# Patient Record
Sex: Female | Born: 1975 | Race: White | Hispanic: No | Marital: Married | Smoking: Never smoker
Health system: Southern US, Community
[De-identification: ages and names within clinical notes are randomized; demographics above are authoritative.]

## PROBLEM LIST (undated history)

## (undated) DIAGNOSIS — J45909 Unspecified asthma, uncomplicated: Secondary | ICD-10-CM

## (undated) DIAGNOSIS — B019 Varicella without complication: Secondary | ICD-10-CM

## (undated) DIAGNOSIS — S83249A Other tear of medial meniscus, current injury, unspecified knee, initial encounter: Secondary | ICD-10-CM

## (undated) DIAGNOSIS — K219 Gastro-esophageal reflux disease without esophagitis: Secondary | ICD-10-CM

## (undated) DIAGNOSIS — E162 Hypoglycemia, unspecified: Secondary | ICD-10-CM

## (undated) DIAGNOSIS — T7840XA Allergy, unspecified, initial encounter: Secondary | ICD-10-CM

## (undated) DIAGNOSIS — IMO0001 Reserved for inherently not codable concepts without codable children: Secondary | ICD-10-CM

## (undated) DIAGNOSIS — Z62810 Personal history of physical and sexual abuse in childhood: Secondary | ICD-10-CM

## (undated) DIAGNOSIS — L989 Disorder of the skin and subcutaneous tissue, unspecified: Secondary | ICD-10-CM

## (undated) DIAGNOSIS — M545 Low back pain: Secondary | ICD-10-CM

## (undated) DIAGNOSIS — J3489 Other specified disorders of nose and nasal sinuses: Secondary | ICD-10-CM

## (undated) DIAGNOSIS — G43909 Migraine, unspecified, not intractable, without status migrainosus: Secondary | ICD-10-CM

## (undated) DIAGNOSIS — J309 Allergic rhinitis, unspecified: Secondary | ICD-10-CM

## (undated) DIAGNOSIS — E559 Vitamin D deficiency, unspecified: Secondary | ICD-10-CM

## (undated) DIAGNOSIS — F419 Anxiety disorder, unspecified: Secondary | ICD-10-CM

## (undated) DIAGNOSIS — R5383 Other fatigue: Secondary | ICD-10-CM

## (undated) DIAGNOSIS — I7300 Raynaud's syndrome without gangrene: Secondary | ICD-10-CM

## (undated) HISTORY — DX: Migraine, unspecified, not intractable, without status migrainosus: G43.909

## (undated) HISTORY — PX: HEMORRHOID SURGERY: SHX153

## (undated) HISTORY — DX: Unspecified asthma, uncomplicated: J45.909

## (undated) HISTORY — DX: Reserved for inherently not codable concepts without codable children: IMO0001

## (undated) HISTORY — DX: Other specified disorders of nose and nasal sinuses: J34.89

## (undated) HISTORY — DX: Raynaud's syndrome without gangrene: I73.00

## (undated) HISTORY — DX: Other tear of medial meniscus, current injury, unspecified knee, initial encounter: S83.249A

## (undated) HISTORY — DX: Other fatigue: R53.83

## (undated) HISTORY — PX: TONSILLECTOMY: SHX5217

## (undated) HISTORY — DX: Personal history of physical and sexual abuse in childhood: Z62.810

## (undated) HISTORY — DX: Allergic rhinitis, unspecified: J30.9

## (undated) HISTORY — DX: Hypoglycemia, unspecified: E16.2

## (undated) HISTORY — DX: Disorder of the skin and subcutaneous tissue, unspecified: L98.9

## (undated) HISTORY — PX: TONSILLECTOMY: SUR1361

## (undated) HISTORY — DX: Anxiety disorder, unspecified: F41.9

## (undated) HISTORY — DX: Allergy, unspecified, initial encounter: T78.40XA

## (undated) HISTORY — DX: Low back pain: M54.5

## (undated) HISTORY — PX: EYE SURGERY: SHX253

## (undated) HISTORY — DX: Gastro-esophageal reflux disease without esophagitis: K21.9

## (undated) HISTORY — DX: Vitamin D deficiency, unspecified: E55.9

## (undated) HISTORY — DX: Varicella without complication: B01.9

## (undated) HISTORY — PX: GASTRIC FUNDOPLICATION: SHX226

## (undated) HISTORY — PX: MANDIBLE SURGERY: SHX707

---

## 1998-09-07 HISTORY — PX: WISDOM TOOTH EXTRACTION: SHX21

## 2003-09-08 DIAGNOSIS — I7300 Raynaud's syndrome without gangrene: Secondary | ICD-10-CM

## 2003-09-08 HISTORY — DX: Raynaud's syndrome without gangrene: I73.00

## 2009-09-07 LAB — HM PAP SMEAR: HM Pap smear: NORMAL

## 2010-05-18 ENCOUNTER — Ambulatory Visit: Payer: Self-pay | Admitting: Family Medicine

## 2010-06-22 ENCOUNTER — Ambulatory Visit: Payer: Self-pay | Admitting: Internal Medicine

## 2011-07-08 ENCOUNTER — Ambulatory Visit: Payer: Self-pay | Admitting: Internal Medicine

## 2012-07-08 ENCOUNTER — Encounter: Payer: Self-pay | Admitting: Family Medicine

## 2012-07-08 ENCOUNTER — Ambulatory Visit (INDEPENDENT_AMBULATORY_CARE_PROVIDER_SITE_OTHER): Payer: BC Managed Care – PPO | Admitting: Family Medicine

## 2012-07-08 VITALS — BP 128/86 | HR 84 | Temp 98.6°F | Ht 63.5 in | Wt 141.4 lb

## 2012-07-08 DIAGNOSIS — IMO0001 Reserved for inherently not codable concepts without codable children: Secondary | ICD-10-CM

## 2012-07-08 DIAGNOSIS — R5381 Other malaise: Secondary | ICD-10-CM

## 2012-07-08 DIAGNOSIS — K229 Disease of esophagus, unspecified: Secondary | ICD-10-CM | POA: Insufficient documentation

## 2012-07-08 DIAGNOSIS — M545 Low back pain, unspecified: Secondary | ICD-10-CM

## 2012-07-08 DIAGNOSIS — J309 Allergic rhinitis, unspecified: Secondary | ICD-10-CM

## 2012-07-08 DIAGNOSIS — R5383 Other fatigue: Secondary | ICD-10-CM | POA: Insufficient documentation

## 2012-07-08 DIAGNOSIS — I7300 Raynaud's syndrome without gangrene: Secondary | ICD-10-CM | POA: Insufficient documentation

## 2012-07-08 DIAGNOSIS — K219 Gastro-esophageal reflux disease without esophagitis: Secondary | ICD-10-CM | POA: Insufficient documentation

## 2012-07-08 DIAGNOSIS — Z0000 Encounter for general adult medical examination without abnormal findings: Secondary | ICD-10-CM

## 2012-07-08 DIAGNOSIS — J45909 Unspecified asthma, uncomplicated: Secondary | ICD-10-CM

## 2012-07-08 DIAGNOSIS — E559 Vitamin D deficiency, unspecified: Secondary | ICD-10-CM

## 2012-07-08 DIAGNOSIS — G43909 Migraine, unspecified, not intractable, without status migrainosus: Secondary | ICD-10-CM

## 2012-07-08 DIAGNOSIS — Z0001 Encounter for general adult medical examination with abnormal findings: Secondary | ICD-10-CM | POA: Insufficient documentation

## 2012-07-08 DIAGNOSIS — E162 Hypoglycemia, unspecified: Secondary | ICD-10-CM

## 2012-07-08 DIAGNOSIS — F411 Generalized anxiety disorder: Secondary | ICD-10-CM

## 2012-07-08 DIAGNOSIS — F419 Anxiety disorder, unspecified: Secondary | ICD-10-CM

## 2012-07-08 HISTORY — DX: Low back pain, unspecified: M54.50

## 2012-07-08 HISTORY — DX: Anxiety disorder, unspecified: F41.9

## 2012-07-08 HISTORY — DX: Other fatigue: R53.83

## 2012-07-08 HISTORY — DX: Hypoglycemia, unspecified: E16.2

## 2012-07-08 LAB — HEPATIC FUNCTION PANEL
ALT: 22 U/L (ref 0–35)
AST: 20 U/L (ref 0–37)
Alkaline Phosphatase: 46 U/L (ref 39–117)
Bilirubin, Direct: 0.1 mg/dL (ref 0.0–0.3)
Total Bilirubin: 0.7 mg/dL (ref 0.3–1.2)
Total Protein: 6.7 g/dL (ref 6.0–8.3)

## 2012-07-08 LAB — CBC
HCT: 38.4 % (ref 36.0–46.0)
RBC: 4.37 Mil/uL (ref 3.87–5.11)
RDW: 15 % — ABNORMAL HIGH (ref 11.5–14.6)
WBC: 5.5 10*3/uL (ref 4.5–10.5)

## 2012-07-08 LAB — RENAL FUNCTION PANEL
CO2: 30 mEq/L (ref 19–32)
Calcium: 9.4 mg/dL (ref 8.4–10.5)
Chloride: 103 mEq/L (ref 96–112)
Creatinine, Ser: 0.9 mg/dL (ref 0.4–1.2)
GFR: 77.53 mL/min (ref >60.00)
Potassium: 4.2 mEq/L (ref 3.5–5.1)
Sodium: 138 mEq/L (ref 135–145)

## 2012-07-08 LAB — LIPID PANEL
Total CHOL/HDL Ratio: 3
VLDL: 16.4 mg/dL (ref 0.0–40.0)

## 2012-07-08 LAB — CORTISOL: Cortisol, Plasma: 6.9 ug/dL

## 2012-07-08 MED ORDER — FEXOFENADINE HCL 180 MG PO TABS: 180.0000 mg | ORAL_TABLET | Freq: Every day | ORAL | Status: DC

## 2012-07-08 NOTE — Assessment & Plan Note (Signed)
Feels better when she minimize carbs. Is not on any medications at this time. He is diet controlled at this time

## 2012-07-08 NOTE — Assessment & Plan Note (Signed)
Patient reports is ongoing and long-standing. She questions the idea of adrenal fatigue. We will check a cortisol and TSH level but she is also asked to consider the idea of regular exercise and possibly movement therapy such as yoga in the future

## 2012-07-08 NOTE — Patient Instructions (Addendum)
Preventive Care for Adults, Female A healthy lifestyle and preventive care can promote health and wellness. Preventive health guidelines for women include the following key practices.  A routine yearly physical is a good way to check with your caregiver about your health and preventive screening. It is a chance to share any concerns and updates on your health, and to receive a thorough exam.  Visit your dentist for a routine exam and preventive care every 6 months. Brush your teeth twice a day and floss once a day. Good oral hygiene prevents tooth decay and gum disease.  The frequency of eye exams is based on your age, health, family medical history, use of contact lenses, and other factors. Follow your caregiver's recommendations for frequency of eye exams.  Eat a healthy diet. Foods like vegetables, fruits, whole grains, low-fat dairy products, and lean protein foods contain the nutrients you need without too many calories. Decrease your intake of foods high in solid fats, added sugars, and salt. Eat the right amount of calories for you.Get information about a proper diet from your caregiver, if necessary.  Regular physical exercise is one of the most important things you can do for your health. Most adults should get at least 150 minutes of moderate-intensity exercise (any activity that increases your heart rate and causes you to sweat) each week. In addition, most adults need muscle-strengthening exercises on 2 or more days a week.  Maintain a healthy weight. The body mass index (BMI) is a screening tool to identify possible weight problems. It provides an estimate of body fat based on height and weight. Your caregiver can help determine your BMI, and can help you achieve or maintain a healthy weight.For adults 20 years and older:  A BMI below 18.5 is considered underweight.  A BMI of 18.5 to 24.9 is normal.  A BMI of 25 to 29.9 is considered overweight.  A BMI of 30 and above is  considered obese.  Maintain normal blood lipids and cholesterol levels by exercising and minimizing your intake of saturated fat. Eat a balanced diet with plenty of fruit and vegetables. Blood tests for lipids and cholesterol should begin at age 20 and be repeated every 5 years. If your lipid or cholesterol levels are high, you are over 50, or you are at high risk for heart disease, you may need your cholesterol levels checked more frequently.Ongoing high lipid and cholesterol levels should be treated with medicines if diet and exercise are not effective.  If you smoke, find out from your caregiver how to quit. If you do not use tobacco, do not start.  If you are pregnant, do not drink alcohol. If you are breastfeeding, be very cautious about drinking alcohol. If you are not pregnant and choose to drink alcohol, do not exceed 1 drink per day. One drink is considered to be 12 ounces (355 mL) of beer, 5 ounces (148 mL) of wine, or 1.5 ounces (44 mL) of liquor.  Avoid use of street drugs. Do not share needles with anyone. Ask for help if you need support or instructions about stopping the use of drugs.  High blood pressure causes heart disease and increases the risk of stroke. Your blood pressure should be checked at least every 1 to 2 years. Ongoing high blood pressure should be treated with medicines if weight loss and exercise are not effective.  If you are 55 to 37 years old, ask your caregiver if you should take aspirin to prevent strokes.  Diabetes   screening involves taking a blood sample to check your fasting blood sugar level. This should be done once every 3 years, after age 45, if you are within normal weight and without risk factors for diabetes. Testing should be considered at a younger age or be carried out more frequently if you are overweight and have at least 1 risk factor for diabetes.  Breast cancer screening is essential preventive care for women. You should practice "breast  self-awareness." This means understanding the normal appearance and feel of your breasts and may include breast self-examination. Any changes detected, no matter how small, should be reported to a caregiver. Women in their 20s and 30s should have a clinical breast exam (CBE) by a caregiver as part of a regular health exam every 1 to 3 years. After age 40, women should have a CBE every year. Starting at age 40, women should consider having a mammography (breast X-ray test) every year. Women who have a family history of breast cancer should talk to their caregiver about genetic screening. Women at a high risk of breast cancer should talk to their caregivers about having magnetic resonance imaging (MRI) and a mammography every year.  The Pap test is a screening test for cervical cancer. A Pap test can show cell changes on the cervix that might become cervical cancer if left untreated. A Pap test is a procedure in which cells are obtained and examined from the lower end of the uterus (cervix).  Women should have a Pap test starting at age 21.  Between ages 21 and 29, Pap tests should be repeated every 2 years.  Beginning at age 30, you should have a Pap test every 3 years as long as the past 3 Pap tests have been normal.  Some women have medical problems that increase the chance of getting cervical cancer. Talk to your caregiver about these problems. It is especially important to talk to your caregiver if a new problem develops soon after your last Pap test. In these cases, your caregiver may recommend more frequent screening and Pap tests.  The above recommendations are the same for women who have or have not gotten the vaccine for human papillomavirus (HPV).  If you had a hysterectomy for a problem that was not cancer or a condition that could lead to cancer, then you no longer need Pap tests. Even if you no longer need a Pap test, a regular exam is a good idea to make sure no other problems are  starting.  If you are between ages 65 and 70, and you have had normal Pap tests going back 10 years, you no longer need Pap tests. Even if you no longer need a Pap test, a regular exam is a good idea to make sure no other problems are starting.  If you have had past treatment for cervical cancer or a condition that could lead to cancer, you need Pap tests and screening for cancer for at least 20 years after your treatment.  If Pap tests have been discontinued, risk factors (such as a new sexual partner) need to be reassessed to determine if screening should be resumed.  The HPV test is an additional test that may be used for cervical cancer screening. The HPV test looks for the virus that can cause the cell changes on the cervix. The cells collected during the Pap test can be tested for HPV. The HPV test could be used to screen women aged 30 years and older, and should   be used in women of any age who have unclear Pap test results. After the age of 30, women should have HPV testing at the same frequency as a Pap test.  Colorectal cancer can be detected and often prevented. Most routine colorectal cancer screening begins at the age of 50 and continues through age 75. However, your caregiver may recommend screening at an earlier age if you have risk factors for colon cancer. On a yearly basis, your caregiver may provide home test kits to check for hidden blood in the stool. Use of a small camera at the end of a tube, to directly examine the colon (sigmoidoscopy or colonoscopy), can detect the earliest forms of colorectal cancer. Talk to your caregiver about this at age 50, when routine screening begins. Direct examination of the colon should be repeated every 5 to 10 years through age 75, unless early forms of pre-cancerous polyps or small growths are found.  Hepatitis C blood testing is recommended for all people born from 1945 through 1965 and any individual with known risks for hepatitis C.  Practice  safe sex. Use condoms and avoid high-risk sexual practices to reduce the spread of sexually transmitted infections (STIs). STIs include gonorrhea, chlamydia, syphilis, trichomonas, herpes, HPV, and human immunodeficiency virus (HIV). Herpes, HIV, and HPV are viral illnesses that have no cure. They can result in disability, cancer, and death. Sexually active women aged 25 and younger should be checked for chlamydia. Older women with new or multiple partners should also be tested for chlamydia. Testing for other STIs is recommended if you are sexually active and at increased risk.  Osteoporosis is a disease in which the bones lose minerals and strength with aging. This can result in serious bone fractures. The risk of osteoporosis can be identified using a bone density scan. Women ages 65 and over and women at risk for fractures or osteoporosis should discuss screening with their caregivers. Ask your caregiver whether you should take a calcium supplement or vitamin D to reduce the rate of osteoporosis.  Menopause can be associated with physical symptoms and risks. Hormone replacement therapy is available to decrease symptoms and risks. You should talk to your caregiver about whether hormone replacement therapy is right for you.  Use sunscreen with sun protection factor (SPF) of 30 or more. Apply sunscreen liberally and repeatedly throughout the day. You should seek shade when your shadow is shorter than you. Protect yourself by wearing long sleeves, pants, a wide-brimmed hat, and sunglasses year round, whenever you are outdoors.  Once a month, do a whole body skin exam, using a mirror to look at the skin on your back. Notify your caregiver of new moles, moles that have irregular borders, moles that are larger than a pencil eraser, or moles that have changed in shape or color.  Stay current with required immunizations.  Influenza. You need a dose every fall (or winter). The composition of the flu vaccine  changes each year, so being vaccinated once is not enough.  Pneumococcal polysaccharide. You need 1 to 2 doses if you smoke cigarettes or if you have certain chronic medical conditions. You need 1 dose at age 65 (or older) if you have never been vaccinated.  Tetanus, diphtheria, pertussis (Tdap, Td). Get 1 dose of Tdap vaccine if you are younger than age 65, are over 65 and have contact with an infant, are a healthcare worker, are pregnant, or simply want to be protected from whooping cough. After that, you need a Td   booster dose every 10 years. Consult your caregiver if you have not had at least 3 tetanus and diphtheria-containing shots sometime in your life or have a deep or dirty wound.  HPV. You need this vaccine if you are a woman age 26 or younger. The vaccine is given in 3 doses over 6 months.  Measles, mumps, rubella (MMR). You need at least 1 dose of MMR if you were born in 1957 or later. You may also need a second dose.  Meningococcal. If you are age 19 to 21 and a first-year college student living in a residence hall, or have one of several medical conditions, you need to get vaccinated against meningococcal disease. You may also need additional booster doses.  Zoster (shingles). If you are age 60 or older, you should get this vaccine.  Varicella (chickenpox). If you have never had chickenpox or you were vaccinated but received only 1 dose, talk to your caregiver to find out if you need this vaccine.  Hepatitis A. You need this vaccine if you have a specific risk factor for hepatitis A virus infection or you simply wish to be protected from this disease. The vaccine is usually given as 2 doses, 6 to 18 months apart.  Hepatitis B. You need this vaccine if you have a specific risk factor for hepatitis B virus infection or you simply wish to be protected from this disease. The vaccine is given in 3 doses, usually over 6 months. Preventive Services / Frequency Ages 19 to 39  Blood  pressure check.** / Every 1 to 2 years.  Lipid and cholesterol check.** / Every 5 years beginning at age 20.  Clinical breast exam.** / Every 3 years for women in their 20s and 30s.  Pap test.** / Every 2 years from ages 21 through 29. Every 3 years starting at age 30 through age 65 or 70 with a history of 3 consecutive normal Pap tests.  HPV screening.** / Every 3 years from ages 30 through ages 65 to 70 with a history of 3 consecutive normal Pap tests.  Hepatitis C blood test.** / For any individual with known risks for hepatitis C.  Skin self-exam. / Monthly.  Influenza immunization.** / Every year.  Pneumococcal polysaccharide immunization.** / 1 to 2 doses if you smoke cigarettes or if you have certain chronic medical conditions.  Tetanus, diphtheria, pertussis (Tdap, Td) immunization. / A one-time dose of Tdap vaccine. After that, you need a Td booster dose every 10 years.  HPV immunization. / 3 doses over 6 months, if you are 26 and younger.  Measles, mumps, rubella (MMR) immunization. / You need at least 1 dose of MMR if you were born in 1957 or later. You may also need a second dose.  Meningococcal immunization. / 1 dose if you are age 19 to 21 and a first-year college student living in a residence hall, or have one of several medical conditions, you need to get vaccinated against meningococcal disease. You may also need additional booster doses.  Varicella immunization.** / Consult your caregiver.  Hepatitis A immunization.** / Consult your caregiver. 2 doses, 6 to 18 months apart.  Hepatitis B immunization.** / Consult your caregiver. 3 doses usually over 6 months. Ages 40 to 64  Blood pressure check.** / Every 1 to 2 years.  Lipid and cholesterol check.** / Every 5 years beginning at age 20.  Clinical breast exam.** / Every year after age 40.  Mammogram.** / Every year beginning at age 40   and continuing for as long as you are in good health. Consult with your  caregiver.  Pap test.** / Every 3 years starting at age 30 through age 65 or 70 with a history of 3 consecutive normal Pap tests.  HPV screening.** / Every 3 years from ages 30 through ages 65 to 70 with a history of 3 consecutive normal Pap tests.  Fecal occult blood test (FOBT) of stool. / Every year beginning at age 50 and continuing until age 75. You may not need to do this test if you get a colonoscopy every 10 years.  Flexible sigmoidoscopy or colonoscopy.** / Every 5 years for a flexible sigmoidoscopy or every 10 years for a colonoscopy beginning at age 50 and continuing until age 75.  Hepatitis C blood test.** / For all people born from 1945 through 1965 and any individual with known risks for hepatitis C.  Skin self-exam. / Monthly.  Influenza immunization.** / Every year.  Pneumococcal polysaccharide immunization.** / 1 to 2 doses if you smoke cigarettes or if you have certain chronic medical conditions.  Tetanus, diphtheria, pertussis (Tdap, Td) immunization.** / A one-time dose of Tdap vaccine. After that, you need a Td booster dose every 10 years.  Measles, mumps, rubella (MMR) immunization. / You need at least 1 dose of MMR if you were born in 1957 or later. You may also need a second dose.  Varicella immunization.** / Consult your caregiver.  Meningococcal immunization.** / Consult your caregiver.  Hepatitis A immunization.** / Consult your caregiver. 2 doses, 6 to 18 months apart.  Hepatitis B immunization.** / Consult your caregiver. 3 doses, usually over 6 months. Ages 65 and over  Blood pressure check.** / Every 1 to 2 years.  Lipid and cholesterol check.** / Every 5 years beginning at age 20.  Clinical breast exam.** / Every year after age 40.  Mammogram.** / Every year beginning at age 40 and continuing for as long as you are in good health. Consult with your caregiver.  Pap test.** / Every 3 years starting at age 30 through age 65 or 70 with a 3  consecutive normal Pap tests. Testing can be stopped between 65 and 70 with 3 consecutive normal Pap tests and no abnormal Pap or HPV tests in the past 10 years.  HPV screening.** / Every 3 years from ages 30 through ages 65 or 70 with a history of 3 consecutive normal Pap tests. Testing can be stopped between 65 and 70 with 3 consecutive normal Pap tests and no abnormal Pap or HPV tests in the past 10 years.  Fecal occult blood test (FOBT) of stool. / Every year beginning at age 50 and continuing until age 75. You may not need to do this test if you get a colonoscopy every 10 years.  Flexible sigmoidoscopy or colonoscopy.** / Every 5 years for a flexible sigmoidoscopy or every 10 years for a colonoscopy beginning at age 50 and continuing until age 75.  Hepatitis C blood test.** / For all people born from 1945 through 1965 and any individual with known risks for hepatitis C.  Osteoporosis screening.** / A one-time screening for women ages 65 and over and women at risk for fractures or osteoporosis.  Skin self-exam. / Monthly.  Influenza immunization.** / Every year.  Pneumococcal polysaccharide immunization.** / 1 dose at age 65 (or older) if you have never been vaccinated.  Tetanus, diphtheria, pertussis (Tdap, Td) immunization. / A one-time dose of Tdap vaccine if you are over   65 and have contact with an infant, are a healthcare worker, or simply want to be protected from whooping cough. After that, you need a Td booster dose every 10 years.  Varicella immunization.** / Consult your caregiver.  Meningococcal immunization.** / Consult your caregiver.  Hepatitis A immunization.** / Consult your caregiver. 2 doses, 6 to 18 months apart.  Hepatitis B immunization.** / Check with your caregiver. 3 doses, usually over 6 months. ** Family history and personal history of risk and conditions may change your caregiver's recommendations. Document Released: 10/20/2001 Document Revised: 11/16/2011  Document Reviewed: 01/19/2011 [employer] Patient Information 2013 [employer].  

## 2012-07-08 NOTE — Assessment & Plan Note (Addendum)
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 Is presently on Advair 100/50 and she remained stable they're hoping to switch her to a strictly inhaled steroid preparation in the not-too-distant future such vulva.

## 2012-07-08 NOTE — Assessment & Plan Note (Addendum)
Improved after some physical therapy. She believes she injured it about a year and a half ago and since antibiotics. Has not had imaging thus far.
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
## 2012-07-08 NOTE — Assessment & Plan Note (Signed)
More frequent with recent move. Is improving some now

## 2012-07-08 NOTE — Assessment & Plan Note (Signed)
Is managed by an allergist in reports she's doing relatively well at this time

## 2012-07-08 NOTE — Assessment & Plan Note (Signed)
Took flu shot in October and Tdap in 2008, encouraged 8 hours of sleep and regular exercise. Request old records and check fasting labs
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
## 2012-07-08 NOTE — Assessment & Plan Note (Signed)
Has had episodes off and on for years. Will check a cortisol level as well as a renal panel is encouraged to eat small frequent meals with lean proteins. Continue to minimize carbohydrate

## 2012-07-08 NOTE — Assessment & Plan Note (Signed)
Historically she's been documented as low as 23 but was 29 in May 2013, has not been taking her her calcium/vitamin d tab daily is encouraged to do so. Brings in a bone density screen from her work which showed a T score of 1.74 in past

## 2012-08-09 ENCOUNTER — Encounter: Payer: Self-pay | Admitting: Family Medicine

## 2012-08-09 ENCOUNTER — Ambulatory Visit (INDEPENDENT_AMBULATORY_CARE_PROVIDER_SITE_OTHER): Payer: BC Managed Care – PPO | Admitting: Family Medicine

## 2012-08-09 VITALS — BP 131/83 | HR 95 | Temp 98.4°F | Ht 63.5 in | Wt 143.8 lb

## 2012-08-09 DIAGNOSIS — J45909 Unspecified asthma, uncomplicated: Secondary | ICD-10-CM

## 2012-08-09 DIAGNOSIS — F411 Generalized anxiety disorder: Secondary | ICD-10-CM

## 2012-08-09 DIAGNOSIS — J309 Allergic rhinitis, unspecified: Secondary | ICD-10-CM

## 2012-08-09 DIAGNOSIS — J40 Bronchitis, not specified as acute or chronic: Secondary | ICD-10-CM

## 2012-08-09 DIAGNOSIS — F419 Anxiety disorder, unspecified: Secondary | ICD-10-CM

## 2012-08-09 MED ORDER — PROMETHAZINE-CODEINE 6.25-10 MG/5ML PO SYRP: 5.0000 mL | ORAL_SOLUTION | ORAL | Status: DC | PRN

## 2012-08-09 MED ORDER — METHYLPREDNISOLONE 4 MG PO KIT: PACK | ORAL | Status: DC

## 2012-08-09 MED ORDER — AZITHROMYCIN 250 MG PO TABS: ORAL_TABLET | ORAL | Status: DC

## 2012-08-09 MED ORDER — ALBUTEROL SULFATE HFA 108 (90 BASE) MCG/ACT IN AERS: 2.0000 | INHALATION_SPRAY | Freq: Four times a day (QID) | RESPIRATORY_TRACT | Status: DC | PRN

## 2012-08-09 MED ORDER — ALPRAZOLAM 0.25 MG PO TABS: 0.2500 mg | ORAL_TABLET | Freq: Two times a day (BID) | ORAL | Status: DC | PRN

## 2012-08-09 NOTE — Assessment & Plan Note (Addendum)
Has been struggling with adjustments to meds for a month, then almost 2 weeks ago she picked up a URI from her kids, thought it was resolving but her chest tightness has begun to worsen again over the past few days. Will continue her Advair at 250/50 bid which she has just restarted. Is given a refill on her Promethazine with Codeine liquid to use prn for cough. Given a prescription for medrol dose pak and Azithromycin

## 2012-08-09 NOTE — Assessment & Plan Note (Signed)
Given just a handful of Alprazolam to try when she feels especially anxious and sob to see if this helps will reevaluate next month

## 2012-08-09 NOTE — Progress Notes (Signed)
Patient ID: Tami Mcdonald, female   DOB: 1976-04-16, 36 y.o.   MRN: [id number] Tami Mcdonald [id number] 1976-06-21 08/09/2012      Progress Note-Follow Up  Subjective  Chief Complaint  Chief Complaint  Patient presents with  . Asthma    flare up- last few weeks- been using Xopenex the last couple of days a few times a week    HPI  Patient is a 36 year old Caucasian female who is in today for evaluation of worsening respiratory symptoms. She sees an allergist in [place]. About a month ago they chose to stop her Advair 100/50 one puff once a day and to use Flovent at the upper half of the day. She thought she did well per week or 2 but then he got bit upper respiratory infection from her case. Had typical fevers, chills, headache, yellow phlegm nasal congestion and a cough. Cough is minimal and congestion was primarily in her head. No symptoms all seem to abate but unfortunately she was left with a sense of chest tightness. She decided to go back on Advair 250 twice a day but today comes in feeling tight. She denies actual wheezing and has a minimal dry cough at this time. No GI or GU complaints at this time. She notes when she starts to get tight like this she often ends up on steroids and antibiotics. So far her peak flow has dropped from a typical number of 550-600 down to 500-550.  Past Medical History  Diagnosis Date  . Chicken pox as a child  . Asthma 5 yrs old  . Allergy     seasonal, pets  . Reflux   . Vitamin D deficiency     history of  . Migraines   . Raynaud's disease 2005  . Allergic rhinitis     seasonal, pets   . Preventative health care 07/08/2012  . Vitamin D deficiency   . GERD (gastroesophageal reflux disease)     surgically corrected with nissen fundoplication, sliding HH  . Low back pain 07/08/2012  . Fatigue 07/08/2012  . Hypoglycemia 07/08/2012  . Anxiety 07/08/2012    Past Surgical History  Procedure Date  . Gastric fundoplication 11 yrs ago  .  Mandible surgery 97,98,99    X 3  . Cesarean section 05 and 07    X 2  . Tonsillectomy 36 yrs old  . Wisdom tooth extraction 2000  . Eye surgery     chest nut burrs in eye  . Hemorrhoid surgery     lanced during pregnancy    Family History  Problem Relation Age of Onset  . Stroke Mother     2 minor  . Hypertension Mother   . Migraines Mother   . Cataracts Father   . Hypertension Father   . Hyperlipidemia Father   . Asthma Father   . Allergy (severe) Father   . Other Brother     brain injury, fell down stairs on head  . Stroke Maternal Grandmother   . Hypertension Maternal Grandmother   . Migraines Maternal Grandmother   . Heart attack Maternal Grandfather   . Hyperlipidemia Paternal Grandmother   . Hyperlipidemia Paternal Grandfather   . Cancer Paternal Aunt     breast    History   Social History  . Marital Status: Married    Spouse Name: N/A    Number of Children: N/A  . Years of Education: N/A   Occupational History  . Not on file.   Social History Main  Topics  . Smoking status: Never Smoker   . Smokeless tobacco: Never Used  . Alcohol Use: Yes     Comment: occasional  . Drug Use: No  . Sexually Active: Yes -- Female partner(s)   Other Topics Concern  . Not on file   Social History Narrative  . No narrative on file    Current Outpatient Prescriptions on File Prior to Visit  Medication Sig Dispense Refill  . azelastine (ASTELIN) 137 MCG/SPRAY nasal spray Place 2 sprays into the nose 2 (two) times daily. Use in each nostril as directed      . calcium-vitamin D (OSCAL WITH D) 500-200 MG-UNIT per tablet Take 1 tablet by mouth daily.      . fexofenadine (KP FEXOFENADINE HCL) 180 MG tablet Take 1 tablet (180 mg total) by mouth daily.      Marland Kitchen levalbuterol (XOPENEX HFA) 45 MCG/ACT inhaler Inhale 1-2 puffs into the lungs every 4 (four) hours as needed for wheezing.  1 Inhaler  12  . montelukast (SINGULAIR) 10 MG tablet Take 10 mg by mouth at bedtime.      .  Multiple Vitamin (MULTIVITAMIN) tablet Take 1 tablet by mouth daily.      Marland Kitchen albuterol (PROVENTIL HFA;VENTOLIN HFA) 108 (90 BASE) MCG/ACT inhaler Inhale 2 puffs into the lungs every 6 (six) hours as needed for wheezing.  1 Inhaler  0    Allergies  Allergen Reactions  . Vistaril (Hydroxyzine Hcl)   . Albuterol Palpitations    Tremulousness, anxiety  . Penicillins Rash  . Sulfa Antibiotics Nausea And Vomiting and Rash    Review of Systems  Review of Systems  Constitutional: Negative for fever and malaise/fatigue.  HENT: Positive for congestion.   Eyes: Negative for discharge.  Respiratory: Positive for shortness of breath.   Cardiovascular: Negative for chest pain, palpitations and leg swelling.  Gastrointestinal: Negative for nausea, abdominal pain and diarrhea.  Genitourinary: Negative for dysuria.  Musculoskeletal: Negative for falls.  Skin: Negative for rash.  Neurological: Negative for loss of consciousness and headaches.  Endo/Heme/Allergies: Negative for polydipsia.  Psychiatric/Behavioral: Negative for depression and suicidal ideas. The patient is not nervous/anxious and does not have insomnia.     Objective  BP 131/83  Pulse 95  Temp 98.4 F (36.9 C) (Temporal)  Ht 5' 3.5" (1.613 m)  Wt 143 lb 12.8 oz (65.227 kg)  BMI 25.07 kg/m2  SpO2 99%  LMP 07/19/2012  Physical Exam  Physical Exam  Constitutional: She is oriented to person, place, and time and well-developed, well-nourished, and in no distress. No distress.  HENT:  Head: Normocephalic and atraumatic.  Eyes: Conjunctivae normal are normal.  Neck: Neck supple. No thyromegaly present.  Cardiovascular: Normal rate, regular rhythm and normal heart sounds.   No murmur heard. Pulmonary/Chest: Effort normal and breath sounds normal. She has no wheezes.  Abdominal: She exhibits no distension and no mass.  Musculoskeletal: She exhibits no edema.  Lymphadenopathy:    She has no cervical adenopathy.   Neurological: She is alert and oriented to person, place, and time.  Skin: Skin is warm and dry. No rash noted. She is not diaphoretic.  Psychiatric: Memory, affect and judgment normal.    Lab Results  Component Value Date   TSH 1.24 07/08/2012   Lab Results  Component Value Date   WBC 5.5 07/08/2012   HGB 12.6 07/08/2012   HCT 38.4 07/08/2012   MCV 87.9 07/08/2012   PLT 302.0 07/08/2012   Lab Results  Component  Value Date   CREATININE 0.9 07/08/2012   BUN 14 07/08/2012   NA 138 07/08/2012   K 4.2 07/08/2012   CL 103 07/08/2012   CO2 30 07/08/2012   Lab Results  Component Value Date   ALT 22 07/08/2012   AST 20 07/08/2012   ALKPHOS 46 07/08/2012   BILITOT 0.7 07/08/2012   Lab Results  Component Value Date   CHOL 211* 07/08/2012   Lab Results  Component Value Date   HDL 79.70 07/08/2012   No results found for this basename: [facility]   Lab Results  Component Value Date   TRIG 82.0 07/08/2012   Lab Results  Component Value Date   CHOLHDL 3 07/08/2012     Assessment & Plan  Allergic rhinitis Maintained on Singulair and Fexofenadine.  Asthma Has been struggling with adjustments to meds for a month, then almost 2 weeks ago she picked up a URI from her kids, thought it was resolving but her chest tightness has begun to worsen again over the past few days. Will continue her Advair at 250/50 bid which she has just restarted. Is given a refill on her Promethazine with Codeine liquid to use prn for cough. Given a prescription for medrol dose pak and Azithromycin  Anxiety Given just a handful of Alprazolam to try when she feels especially anxious and sob to see if this helps will reevaluate next month

## 2012-08-09 NOTE — Patient Instructions (Addendum)

## 2012-08-09 NOTE — Assessment & Plan Note (Signed)
Maintained on Singulair and Fexofenadine.

## 2012-09-28 LAB — HM PAP SMEAR: HM PAP: NEGATIVE

## 2013-05-03 ENCOUNTER — Telehealth: Payer: Self-pay | Admitting: Family Medicine

## 2013-05-03 NOTE — Telephone Encounter (Signed)
Please advise 

## 2013-05-03 NOTE — Telephone Encounter (Signed)
Patient states that she is having more frequent migraines and would like to know if Dr. Abner Greenspan would refill hydrocodone/apap until she is seen on 05/16/13. I did offer patient an appointment to be seen earlier, but she declined.

## 2013-05-03 NOTE — Telephone Encounter (Signed)
I am willing to give her a handful of Norco til she comes in but I do not see where we have prescribed it before. What strenght did she use?

## 2013-05-04 MED ORDER — HYDROCODONE-ACETAMINOPHEN 5-325 MG PO TABS: 1.0000 | ORAL_TABLET | Freq: Three times a day (TID) | ORAL | Status: DC | PRN

## 2013-05-04 NOTE — Telephone Encounter (Signed)
RX faxed to pharmacy.

## 2013-05-04 NOTE — Telephone Encounter (Signed)
Patient states she has had Hydrocodone/APAP 5-325 mg.  Pharmacy is [facility] - [id number] # [id number]

## 2013-05-04 NOTE — Telephone Encounter (Signed)
Norco 5/325 1 tab po q 8 hours prn pain, disp # 30 no rf

## 2013-05-16 ENCOUNTER — Ambulatory Visit (INDEPENDENT_AMBULATORY_CARE_PROVIDER_SITE_OTHER): Payer: BC Managed Care – PPO | Admitting: Family Medicine

## 2013-05-16 ENCOUNTER — Encounter: Payer: Self-pay | Admitting: Family Medicine

## 2013-05-16 VITALS — BP 122/70 | HR 66 | Temp 98.4°F | Ht 63.5 in | Wt 143.1 lb

## 2013-05-16 DIAGNOSIS — J3489 Other specified disorders of nose and nasal sinuses: Secondary | ICD-10-CM

## 2013-05-16 DIAGNOSIS — Z23 Encounter for immunization: Secondary | ICD-10-CM

## 2013-05-16 DIAGNOSIS — K219 Gastro-esophageal reflux disease without esophagitis: Secondary | ICD-10-CM

## 2013-05-16 DIAGNOSIS — G43909 Migraine, unspecified, not intractable, without status migrainosus: Secondary | ICD-10-CM

## 2013-05-16 DIAGNOSIS — J45909 Unspecified asthma, uncomplicated: Secondary | ICD-10-CM

## 2013-05-16 DIAGNOSIS — IMO0001 Reserved for inherently not codable concepts without codable children: Secondary | ICD-10-CM

## 2013-05-16 DIAGNOSIS — B958 Unspecified staphylococcus as the cause of diseases classified elsewhere: Secondary | ICD-10-CM

## 2013-05-16 DIAGNOSIS — L989 Disorder of the skin and subcutaneous tissue, unspecified: Secondary | ICD-10-CM

## 2013-05-16 DIAGNOSIS — E559 Vitamin D deficiency, unspecified: Secondary | ICD-10-CM

## 2013-05-16 MED ORDER — PROMETHAZINE HCL 25 MG PO TABS: 25.0000 mg | ORAL_TABLET | Freq: Three times a day (TID) | ORAL | Status: DC | PRN

## 2013-05-16 MED ORDER — MUPIROCIN 2 % EX OINT: TOPICAL_OINTMENT | Freq: Two times a day (BID) | CUTANEOUS | Status: DC

## 2013-05-16 MED ORDER — FLUTICASONE PROPIONATE HFA 110 MCG/ACT IN AERO: 2.0000 | INHALATION_SPRAY | Freq: Two times a day (BID) | RESPIRATORY_TRACT | Status: DC

## 2013-05-16 NOTE — Patient Instructions (Addendum)
Dermatology: [facility], Dr Katrinka Blazing, [place]   Asthma Prevention Cigarette smoke, house dust, molds, pollens, animal dander, certain insects, exercise, and even cold air are all triggers that can cause an asthma attack. Often, no specific triggers are identified.  Take the following measures around your house to reduce attacks:  Avoid cigarette and other smoke. No smoking should be allowed in a home where someone with asthma lives. If smoking is allowed indoors, it should be done in a room with a closed door, and a window should be opened to clear the air. If possible, do not use a wood-burning stove, kerosene heater, or fireplace. Minimize exposure to all sources of smoke, including incense, candles, fires, and fireworks.  Decrease pollen exposure. Keep your windows shut and use central air during the pollen allergy season. Stay indoors with windows closed from late morning to afternoon, if you can. Avoid mowing the lawn if you have grass pollen allergy. Change your clothes and shower after being outside during this time of year.  Remove molds from bathrooms and wet areas. Do this by cleaning the floors with a fungicide or diluted bleach. Avoid using humidifiers, vaporizers, or swamp coolers. These can spread molds through the air. Fix leaky faucets, pipes, or other sources of water that have mold around them.  Decrease house dust exposure. Do this by using bare floors, vacuuming frequently, and changing furnace and air cooler filters frequently. Avoid using feather, wool, or foam bedding. Use polyester pillows and plastic covers over your mattress. Wash bedding weekly in hot water (hotter than 130 F).  Try to get someone else to vacuum for you once or twice a week, if you can. Stay out of rooms while they are being vacuumed and for a short while afterward. If you vacuum, use a dust mask (from a hardware store), a double-layered or microfilter vacuum cleaner bag, or a vacuum cleaner with a  HEPA filter.  Avoid perfumes, talcum powder, hair spray, paints and other strong odors and fumes.  Keep warm-blooded pets (cats, dogs, rodents, birds) outside the home if they are triggers for asthma. If you can't keep the pet outdoors, keep the pet out of your bedroom and other sleeping areas at all times, and keep the door closed. Remove carpets and furniture covered with cloth from your home. If that is not possible, keep the pet away from fabric-covered furniture and carpets.  Eliminate cockroaches. Keep food and garbage in closed containers. Never leave food out. Use poison baits, traps, powders, gels, or paste (for example, boric acid). If a spray is used to kill cockroaches, stay out of the room until the odor goes away.  Decrease indoor humidity to less than 60%. Use an indoor air cleaning device.  Avoid sulfites in foods and beverages. Do not drink beer or wine or eat dried fruit, processed potatoes, or shrimp if they cause asthma symptoms.  Avoid cold air. Cover your nose and mouth with a scarf on cold or windy days.  Avoid aspirin. This is the most common drug causing serious asthma attacks.  If exercise triggers your asthma, ask your caregiver how you should prepare before exercising. (For example, ask if you could use your inhaler 10 minutes before exercising.)  Avoid close contact with people who have a cold or the flu since your asthma symptoms may get worse if you catch the infection from them. Wash your hands thoroughly after touching items that may have been handled by others with a respiratory infection.  Get a  flu shot every year to protect against the flu virus, which often makes asthma worse for days to weeks. Also get a pneumonia shot once every five to 10 years. Call your caregiver if you want further information about measures you can take to help prevent asthma attacks. Document Released: 08/24/2005 Document Revised: 11/16/2011 Document Reviewed: 07/02/2009 [facility]  Patient Information 2014 [place], [place].

## 2013-05-20 ENCOUNTER — Encounter: Payer: Self-pay | Admitting: Family Medicine

## 2013-05-20 DIAGNOSIS — L989 Disorder of the skin and subcutaneous tissue, unspecified: Secondary | ICD-10-CM

## 2013-05-20 DIAGNOSIS — J3489 Other specified disorders of nose and nasal sinuses: Secondary | ICD-10-CM | POA: Insufficient documentation

## 2013-05-20 HISTORY — DX: Other specified disorders of nose and nasal sinuses: J34.89

## 2013-05-20 HISTORY — DX: Disorder of the skin and subcutaneous tissue, unspecified: L98.9

## 2013-05-20 NOTE — Assessment & Plan Note (Signed)
Fortunately gets infrequent migraines but did have one recently. Good response to Hydrocodone increse hydration and rest.

## 2013-05-20 NOTE — Progress Notes (Signed)
Patient ID: Tami Mcdonald, female   DOB: 03/10/1976, 37 y.o.   MRN: [id number] Tami Mcdonald [id number] 01/18/1976 05/20/2013      Progress Note-Follow Up  Subjective  Chief Complaint  Chief Complaint  Patient presents with  . Migraine    HPI  Patient is a 37 year old Caucasian female who is in today for followup on migraines. She is infrequent migraines since changing her lifestyle and getting more sleep and hydrating better but did have one recently. She notes they have flared by bright lights and stress. She is unable to take triptans secondary to her mom having a stroke on them. She is concerned about a spot on her leg she's had for a long time but is changed in the last couple weeks. Her asthma has been well-controlled. No coughing or wheezing. She has recurrent infection in noticed any has been treated first past.  Past Medical History  Diagnosis Date  . Chicken pox as a child  . Asthma 5 yrs old  . Allergy     seasonal, pets  . Reflux   . Vitamin D deficiency     history of  . Migraines   . Raynaud's disease 2005  . Allergic rhinitis     seasonal, pets   . Preventative health care 07/08/2012  . Vitamin D deficiency   . GERD (gastroesophageal reflux disease)     surgically corrected with nissen fundoplication, sliding HH  . Low back pain 07/08/2012  . Fatigue 07/08/2012  . Hypoglycemia 07/08/2012  . Anxiety 07/08/2012    Past Surgical History  Procedure Laterality Date  . Gastric fundoplication  11 yrs ago  . Mandible surgery  97,98,99    X 3  . Cesarean section  05 and 07    X 2  . Tonsillectomy  37 yrs old  . Wisdom tooth extraction  2000  . Eye surgery      chest nut burrs in eye  . Hemorrhoid surgery      lanced during pregnancy    Family History  Problem Relation Age of Onset  . Stroke Mother     2 minor  . Hypertension Mother   . Migraines Mother   . Cataracts Father   . Hypertension Father   . Hyperlipidemia Father   . Asthma Father    . Allergy (severe) Father   . Other Brother     brain injury, fell down stairs on head  . Stroke Maternal Grandmother   . Hypertension Maternal Grandmother   . Migraines Maternal Grandmother   . Heart attack Maternal Grandfather   . Hyperlipidemia Paternal Grandmother   . Hyperlipidemia Paternal Grandfather   . Cancer Paternal Aunt     breast    History   Social History  . Marital Status: Married    Spouse Name: N/A    Number of Children: N/A  . Years of Education: N/A   Occupational History  . Not on file.   Social History Main Topics  . Smoking status: Never Smoker   . Smokeless tobacco: Never Used  . Alcohol Use: Yes     Comment: occasional  . Drug Use: No  . Sexual Activity: Yes    Partners: Male   Other Topics Concern  . Not on file   Social History Narrative  . No narrative on file    Current Outpatient Prescriptions on File Prior to Visit  Medication Sig Dispense Refill  . azelastine (ASTELIN) 137 MCG/SPRAY nasal spray Place 2 sprays into the  nose 2 (two) times daily. Use in each nostril as directed      . calcium-vitamin D (OSCAL WITH D) 500-200 MG-UNIT per tablet Take 1 tablet by mouth as directed. 2-3 times a week      . fexofenadine (KP FEXOFENADINE HCL) 180 MG tablet Take 1 tablet (180 mg total) by mouth daily.      Marland Kitchen HYDROcodone-acetaminophen (NORCO/VICODIN) 5-325 MG per tablet Take 1 tablet by mouth every 8 (eight) hours as needed for pain.  30 tablet  0  . Krill Oil CAPS Take by mouth. Mega red- 1-2 week      . montelukast (SINGULAIR) 10 MG tablet Take 10 mg by mouth at bedtime.      . Multiple Vitamin (MULTIVITAMIN) tablet Take 1 tablet by mouth daily.       No current facility-administered medications on file prior to visit.    Allergies  Allergen Reactions  . Vistaril [Hydroxyzine Hcl]   . Albuterol Palpitations    Tremulousness, anxiety  . Penicillins Rash  . Sulfa Antibiotics Nausea And Vomiting and Rash    Review of  Systems  Review of Systems  Constitutional: Negative for fever and malaise/fatigue.  HENT: Negative for congestion.   Eyes: Negative for discharge.  Respiratory: Negative for shortness of breath.   Cardiovascular: Negative for chest pain, palpitations and leg swelling.  Gastrointestinal: Negative for nausea, abdominal pain and diarrhea.  Genitourinary: Negative for dysuria.  Musculoskeletal: Negative for falls.  Skin: Negative for rash.  Neurological: Negative for loss of consciousness and headaches.  Endo/Heme/Allergies: Negative for polydipsia.  Psychiatric/Behavioral: Negative for depression and suicidal ideas. The patient is not nervous/anxious and does not have insomnia.     Objective  BP 122/70  Pulse 66  Temp(Src) 98.4 F (36.9 C) (Oral)  Ht 5' 3.5" (1.613 m)  Wt 143 lb 1.9 oz (64.919 kg)  BMI 24.95 kg/m2  SpO2 98%  LMP 04/29/2013  Physical Exam  Physical Exam  Constitutional: She is oriented to person, place, and time and well-developed, well-nourished, and in no distress. No distress.  HENT:  Head: Normocephalic and atraumatic.  Eyes: Conjunctivae are normal.  Neck: Neck supple. No thyromegaly present.  Cardiovascular: Normal rate, regular rhythm and normal heart sounds.   No murmur heard. Pulmonary/Chest: Effort normal and breath sounds normal. She has no wheezes.  Abdominal: She exhibits no distension and no mass.  Musculoskeletal: She exhibits no edema.  Lymphadenopathy:    She has no cervical adenopathy.  Neurological: She is alert and oriented to person, place, and time.  Skin: Skin is warm and dry. No rash noted. She is not diaphoretic.  Psychiatric: Memory, affect and judgment normal.    Lab Results  Component Value Date   TSH 1.24 07/08/2012   Lab Results  Component Value Date   WBC 5.5 07/08/2012   HGB 12.6 07/08/2012   HCT 38.4 07/08/2012   MCV 87.9 07/08/2012   PLT 302.0 07/08/2012   Lab Results  Component Value Date   CREATININE 0.9  07/08/2012   BUN 14 07/08/2012   NA 138 07/08/2012   K 4.2 07/08/2012   CL 103 07/08/2012   CO2 30 07/08/2012   Lab Results  Component Value Date   ALT 22 07/08/2012   AST 20 07/08/2012   ALKPHOS 46 07/08/2012   BILITOT 0.7 07/08/2012   Lab Results  Component Value Date   CHOL 211* 07/08/2012   Lab Results  Component Value Date   HDL 79.70 07/08/2012  No results found for this basename: [facility]   Lab Results  Component Value Date   TRIG 82.0 07/08/2012   Lab Results  Component Value Date   CHOLHDL 3 07/08/2012     Assessment & Plan  Migraines Fortunately gets infrequent migraines but did have one recently. Good response to Hydrocodone increse hydration and rest.   Vitamin D deficiency Check level prior to annual exam  Asthma Has done very well with her asthma this year. Will try to drop from Advair to Flovent.   Reflux Doing much better since stopping gluten  Skin lesion of left leg Given names of several dernatologists, she chooses to schedule appt herself  Internal nasal lesion Given rx for Mupirocin and aasked to cleanse leison with H2O2 start a probiotic

## 2013-05-20 NOTE — Assessment & Plan Note (Signed)
Given names of several dernatologists, she chooses to schedule appt herself

## 2013-05-20 NOTE — Assessment & Plan Note (Signed)
Given rx for Mupirocin and aasked to cleanse leison with H2O2 start a probiotic

## 2013-05-20 NOTE — Assessment & Plan Note (Signed)
Check level prior to annual exam

## 2013-05-20 NOTE — Assessment & Plan Note (Signed)
Has done very well with her asthma this year. Will try to drop from Advair to Flovent.

## 2013-05-20 NOTE — Assessment & Plan Note (Signed)
Doing much better since stopping gluten

## 2013-06-30 ENCOUNTER — Ambulatory Visit (INDEPENDENT_AMBULATORY_CARE_PROVIDER_SITE_OTHER): Payer: BC Managed Care – PPO | Admitting: Family Medicine

## 2013-06-30 ENCOUNTER — Encounter: Payer: Self-pay | Admitting: Family Medicine

## 2013-06-30 VITALS — BP 110/80 | HR 70 | Temp 98.4°F | Ht 63.5 in | Wt 137.0 lb

## 2013-06-30 DIAGNOSIS — IMO0001 Reserved for inherently not codable concepts without codable children: Secondary | ICD-10-CM

## 2013-06-30 DIAGNOSIS — K219 Gastro-esophageal reflux disease without esophagitis: Secondary | ICD-10-CM

## 2013-06-30 DIAGNOSIS — J3489 Other specified disorders of nose and nasal sinuses: Secondary | ICD-10-CM

## 2013-06-30 NOTE — Patient Instructions (Signed)
So as nose feels mupirocin at bedtime,, after 2 weeks of no recurrence drop to every other day and then twice a week, if no recurrence after a couple weeks then can stop. Probiotics daily  MRSA Overview MRSA stands for methicillin-resistant Staphylococcus aureus. It is a type of bacteria that is resistant to some common antibiotics. It can cause infections in the skin and many other places in the body. Staphylococcus aureus, often called "staph," is a bacteria that normally lives on the skin or in the nose. Staph on the surface of the skin or in the nose does not cause problems. However, if the staph enters the body through a cut, wound, or break in the skin, an infection can happen. Up until recently, infections with the MRSA type of staph mainly occurred in hospitals and other healthcare settings. There are now increasing problems with MRSA infections in the community as well. Infections with MRSA may be very serious or even life-threatening. Most MRSA infections are acquired in one of two ways:  Healthcare-associated MRSA (HA-MRSA)  This can be acquired by people in any healthcare setting. MRSA can be a big problem for hospitalized people, people in nursing homes, people in rehabilitation facilities, people with weakened immune systems, dialysis patients, and those who have had surgery.  Community-associated MRSA (CA-MRSA)  Community spread of MRSA is becoming more common. It is known to spread in crowded settings, in jails and prisons, and in situations where there is close skin-to-skin contact, such as during sporting events or in locker rooms. MRSA can be spread through shared items, such as children's toys, razors, towels, or sports equipment. CAUSES  All staph, including MRSA, are normally harmless unless they enter the body through a scratch, cut, or wound, such as with surgery. All staph, including MRSA, can be spread from person-to-person by touching contaminated objects or through  direct contact. SPECIAL GROUPS MRSA can present problems for special groups of people. Some of these groups include:  Breastfeeding women.  The most common problem is MRSA infection of the breast (mastitis). There is evidence that MRSA can be passed to an infant from infected breast milk. Your caregiver may recommend that you stop breastfeeding until the mastitis is under control.  If you are breastfeeding and have a MRSA infection in a place other than the breast, you may usually continue breastfeeding while under treatment. If taking antibiotics, ask your caregiver if it is safe to continue breastfeeding while taking your prescribed medicines.  Neonates (babies from birth to 54 month old) and infants (babies from 84 month to 23 year old).  There is evidence that MRSA can be passed to a newborn at birth if the mother has MRSA on the skin, in or around the birth canal, or an infection in the uterus, cervix, or vagina. MRSA infection can have the same appearance as a normal newborn or infant rash or several other skin infections. This can make it hard to diagnose MRSA.  Immune compromised people.  If you have an immune system problem, you may have a higher chance of developing a MRSA infection.  People after any type of surgery.  Staph in general, including MRSA, is the most common cause of infections occurring at the site of recent surgery.  People on long-term steroid medicines.  These kinds of medicines can lower your resistance to infection. This can increase your chance of getting MRSA.  People who have had frequent hospitalizations, live in nursing homes or other residential care facilities, have  venous or urinary catheters, or have taken multiple courses of antibiotic therapy for any reason. DIAGNOSIS  Diagnosis of MRSA is done by cultures of fluid samples that may come from:  Swabs taken from cuts or wounds in infected areas.  Nasal swabs.  Saliva or deep cough specimens from  the lungs (sputum).  Urine.  Blood. Many people are "colonized" with MRSA but have no signs of infection. This means that people carry the MRSA germ on their skin or in their nose and may never develop MRSA infection.  TREATMENT  Treatment varies and is based on how serious, how deep, or how extensive the infection is. For example:  Some skin infections, such as a small boil or abscess, may be treated by draining yellowish-white fluid (pus) from the site of the infection.  Deeper or more widespread soft tissue infections are usually treated with surgery to drain pus and with antibiotic medicine given by vein or by mouth. This may be recommended even if you are pregnant.  Serious infections may require a hospital stay. If antibiotics are given, they may be needed for several weeks. PREVENTION  Because many people are colonized with staph, including MRSA, preventing the spread of the bacteria from person-to-person is most important. The best way to prevent the spread of bacteria and other germs is through proper hand washing or by using alcohol-based hand disinfectants. The following are other ways to help prevent MRSA infection within the hospital and community settings.   Healthcare settings:  Strict hand washing or hand disinfection procedures need to be followed before and after touching every patient.  Patients infected with MRSA are placed in isolation to prevent the spread of the bacteria.  Healthcare workers need to wear disposable gowns and gloves when touching or caring for patients infected with MRSA. Visitors may also be asked to wear a gown and gloves.  Hospital surfaces need to be disinfected frequently.  Community settings:  NIKE frequently with soap and water for at least 15 seconds. Otherwise, use alcohol-based hand disinfectants when soap and water is not available.  Make sure people who live with you wash their hands often, too.  Do not share personal  items. For example, avoid sharing razors and other personal hygiene items, towels, clothing, and athletic equipment.  Wash and dry your clothes and bedding at the warmest temperatures recommended on the labels.  Keep wounds covered. Pus from infected sores may contain MRSA and other bacteria. Keep cuts and abrasions clean and covered with germ-free (sterile), dry bandages until they are healed.  If you have a wound that appears infected, ask your caregiver if a culture for MRSA and other bacteria should be done.  If you are breastfeeding, talk to your caregiver about MRSA. You may be asked to temporarily stop breastfeeding. HOME CARE INSTRUCTIONS   Take your antibiotics as directed. Finish them even if you start to feel better.  Avoid close contact with those around you as much as possible. Do not use towels, razors, toothbrushes, bedding, or other items that will be used by others.  To fight the infection, follow your caregiver's instructions for wound care. Wash your hands before and after changing your bandages.  If you have an intravascular device, such as a catheter, make sure you know how to care for it.  Be sure to tell any healthcare providers that you have MRSA so they are aware of your infection. SEEK IMMEDIATE MEDICAL CARE IF:   The infection appears to be  getting worse. Signs include:  Increased warmth, redness, or tenderness around the wound site.  A red line that extends from the infection site.  A dark color in the area around the infection.  Wound drainage that is tan, yellow, or green.  A bad smell coming from the wound.  You feel sick to your stomach (nauseous) and throw up (vomit) or cannot keep medicine down.  You have a fever.  Your baby is older than 3 months with a rectal temperature of 102 F (38.9 C) or higher.  Your baby is 53 months old or younger with a rectal temperature of 100.4 F (38 C) or higher.  You have difficulty breathing. MAKE SURE  YOU:   Understand these instructions.  Will watch your condition.  Will get help right away if you are not doing well or get worse. Document Released: 08/24/2005 Document Revised: 11/16/2011 Document Reviewed: 11/26/2010 [facility] Patient Information 2014 [place], [place].

## 2013-06-30 NOTE — Assessment & Plan Note (Signed)
  So as nose feels mupirocin at bedtime,, after 2 weeks of no recurrence drop to every other day and then twice a week, if no recurrence after a couple weeks then can stop. Probiotics daily. Call if worse may need to consider referral to ENT, or course of Vancomycin, unable to take Bactrim

## 2013-06-30 NOTE — Progress Notes (Signed)
Patient ID: Tami Mcdonald Age, female   DOB: [birth year], 37 y.o.   MRN: [id number] Danaye Sobh [id number] October 13, 1975 06/30/2013      Progress Note-Follow Up  Subjective  Chief Complaint  Chief Complaint  Patient presents with  . Follow-up    on infection in nose- went away but has recently came back    HPI  37 year old Caucasian female who is in today concerned about recurrent nasal lesions. She had been off and on for years. She used a course of mupirocin recently and responded well but unfortunately they have recurred. She does continue to struggle with some mild postnasal drip and an occasional nighttime cough but is greatly improved. Her allergy medicines have been helping to control this as had her dietary changes for her reflux. No chest pain, palpitations, shortness of breath. No GU complaints or fevers.  Past Medical History  Diagnosis Date  . Chicken pox as a child  . Asthma 5 yrs old  . Allergy     seasonal, pets  . Reflux   . Vitamin D deficiency     history of  . Migraines   . Raynaud's disease 2005  . Allergic rhinitis     seasonal, pets   . Preventative health care 07/08/2012  . Vitamin D deficiency   . GERD (gastroesophageal reflux disease)     surgically corrected with nissen fundoplication, sliding HH  . Low back pain 07/08/2012  . Fatigue 07/08/2012  . Hypoglycemia 07/08/2012  . Anxiety 07/08/2012  . Skin lesion of left leg 05/20/2013  . Internal nasal lesion 05/20/2013    Past Surgical History  Procedure Laterality Date  . Gastric fundoplication  11 yrs ago  . Mandible surgery  97,98,99    X 3  . Cesarean section  05 and 07    X 2  . Tonsillectomy  37 yrs old  . Wisdom tooth extraction  2000  . Eye surgery      chest nut burrs in eye  . Hemorrhoid surgery      lanced during pregnancy    Family History  Problem Relation Age of Onset  . Stroke Mother     2 minor  . Hypertension Mother   . Migraines Mother   . Cataracts Father   .  Hypertension Father   . Hyperlipidemia Father   . Asthma Father   . Allergy (severe) Father   . Other Brother     brain injury, fell down stairs on head  . Stroke Maternal Grandmother   . Hypertension Maternal Grandmother   . Migraines Maternal Grandmother   . Heart attack Maternal Grandfather   . Hyperlipidemia Paternal Grandmother   . Hyperlipidemia Paternal Grandfather   . Cancer Paternal Aunt     breast    History   Social History  . Marital Status: Married    Spouse Name: N/A    Number of Children: N/A  . Years of Education: N/A   Occupational History  . Not on file.   Social History Main Topics  . Smoking status: Never Smoker   . Smokeless tobacco: Never Used  . Alcohol Use: Yes     Comment: occasional  . Drug Use: No  . Sexual Activity: Yes    Partners: Male   Other Topics Concern  . Not on file   Social History Narrative  . No narrative on file    Current Outpatient Prescriptions on File Prior to Visit  Medication Sig Dispense Refill  . albuterol (  PROVENTIL HFA;VENTOLIN HFA) 108 (90 BASE) MCG/ACT inhaler Inhale 2 puffs into the lungs every 6 (six) hours as needed for wheezing.      Marland Kitchen azelastine (ASTELIN) 137 MCG/SPRAY nasal spray Place 2 sprays into the nose 2 (two) times daily. Use in each nostril as directed      . calcium-vitamin D (OSCAL WITH D) 500-200 MG-UNIT per tablet Take 1 tablet by mouth as directed. 2-3 times a week      . fexofenadine (KP FEXOFENADINE HCL) 180 MG tablet Take 1 tablet (180 mg total) by mouth daily.      Boris Lown Oil CAPS Take by mouth. Mega red- 1-2 week      . montelukast (SINGULAIR) 10 MG tablet Take 10 mg by mouth at bedtime.      . Multiple Vitamin (MULTIVITAMIN) tablet Take 1 tablet by mouth daily.      . mupirocin ointment (BACTROBAN) 2 % Apply topically 2 (two) times daily. Via a qtip to nares  22 g  2  . fluticasone (FLOVENT HFA) 110 MCG/ACT inhaler Inhale 2 puffs into the lungs 2 (two) times daily.  1 Inhaler  12  .  Fluticasone-Salmeterol (ADVAIR) 100-50 MCG/DOSE AEPB Inhale 1 puff into the lungs every 12 (twelve) hours.      Marland Kitchen HYDROcodone-acetaminophen (NORCO/VICODIN) 5-325 MG per tablet Take 1 tablet by mouth every 8 (eight) hours as needed for pain.  30 tablet  0  . promethazine (PHENERGAN) 25 MG tablet Take 1 tablet (25 mg total) by mouth every 8 (eight) hours as needed for nausea.  30 tablet  1   No current facility-administered medications on file prior to visit.    Allergies  Allergen Reactions  . Vistaril [Hydroxyzine Hcl]   . Albuterol Palpitations    Tremulousness, anxiety  . Penicillins Rash  . Sulfa Antibiotics Nausea And Vomiting and Rash    Review of Systems  Review of Systems  Constitutional: Negative for fever and malaise/fatigue.  HENT: Positive for congestion and nosebleeds.        Recurrent lesion in right nostril  Eyes: Negative for discharge.  Respiratory: Negative for shortness of breath.   Cardiovascular: Negative for chest pain, palpitations and leg swelling.  Gastrointestinal: Negative for nausea, abdominal pain and diarrhea.  Genitourinary: Negative for dysuria.  Musculoskeletal: Negative for falls.  Skin: Negative for rash.  Neurological: Negative for loss of consciousness and headaches.  Endo/Heme/Allergies: Negative for polydipsia.  Psychiatric/Behavioral: Negative for depression and suicidal ideas. The patient is not nervous/anxious and does not have insomnia.     Objective  BP 110/80  Pulse 70  Temp(Src) 98.4 F (36.9 C) (Oral)  Ht 5' 3.5" (1.613 m)  Wt 137 lb (62.143 kg)  BMI 23.88 kg/m2  SpO2 98%  LMP 06/12/2013  Physical Exam  Physical Exam  Constitutional: She is oriented to person, place, and time and well-developed, well-nourished, and in no distress. No distress.  HENT:  Head: Normocephalic and atraumatic.  Thick nasal mucosa on septum anteriorly of right nres and a small speck of blood where she recently scratched herself in her left  nostril on anterior septum  Eyes: Conjunctivae are normal.  Neck: Neck supple. No thyromegaly present.  Cardiovascular: Normal rate, regular rhythm and normal heart sounds.   No murmur heard. Pulmonary/Chest: Effort normal and breath sounds normal. She has no wheezes.  Abdominal: She exhibits no distension and no mass.  Musculoskeletal: She exhibits no edema.  Lymphadenopathy:    She has no cervical adenopathy.  Neurological: She is alert and oriented to person, place, and time.  Skin: Skin is warm and dry. No rash noted. She is not diaphoretic.  Psychiatric: Memory, affect and judgment normal.    Lab Results  Component Value Date   TSH 1.24 07/08/2012   Lab Results  Component Value Date   WBC 5.5 07/08/2012   HGB 12.6 07/08/2012   HCT 38.4 07/08/2012   MCV 87.9 07/08/2012   PLT 302.0 07/08/2012   Lab Results  Component Value Date   CREATININE 0.9 07/08/2012   BUN 14 07/08/2012   NA 138 07/08/2012   K 4.2 07/08/2012   CL 103 07/08/2012   CO2 30 07/08/2012   Lab Results  Component Value Date   ALT 22 07/08/2012   AST 20 07/08/2012   ALKPHOS 46 07/08/2012   BILITOT 0.7 07/08/2012   Lab Results  Component Value Date   CHOL 211* 07/08/2012   Lab Results  Component Value Date   HDL 79.70 07/08/2012   No results found for this basename: [facility]   Lab Results  Component Value Date   TRIG 82.0 07/08/2012   Lab Results  Component Value Date   CHOLHDL 3 07/08/2012     Assessment & Plan  Internal nasal lesion  So as nose feels mupirocin at bedtime,, after 2 weeks of no recurrence drop to every other day and then twice a week, if no recurrence after a couple weeks then can stop. Probiotics daily. Call if worse may need to consider referral to ENT, or course of Vancomycin, unable to take Bactrim  Reflux Doing much better with dietary changes. Encouraged varying probiotics and small, frequent meals not too close to bedtime.

## 2013-07-02 NOTE — Assessment & Plan Note (Signed)
Doing much better with dietary changes. Encouraged varying probiotics and small, frequent meals not too close to bedtime.

## 2013-07-19 ENCOUNTER — Telehealth: Payer: Self-pay | Admitting: Family Medicine

## 2013-07-19 DIAGNOSIS — Z0000 Encounter for general adult medical examination without abnormal findings: Secondary | ICD-10-CM

## 2013-07-19 DIAGNOSIS — J45909 Unspecified asthma, uncomplicated: Secondary | ICD-10-CM

## 2013-07-19 DIAGNOSIS — E559 Vitamin D deficiency, unspecified: Secondary | ICD-10-CM

## 2013-07-19 NOTE — Telephone Encounter (Signed)
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 Also, she wants to have her cholesterol, calcium, and vit d checked along with whatever Dr. Abner Greenspan wanted her to have. She states that she talked about this to Dr. Abner Greenspan at last visit.  Also, she says that she is no longer going to her Allergist and would like to know if Dr. Abner Greenspan would refill singulair, albuterol inhaler, azelastine, and flovent inhaler to [facility]?

## 2013-07-20 NOTE — Telephone Encounter (Signed)
Please Advise/SLS  

## 2013-07-20 NOTE — Telephone Encounter (Signed)
So warn her sometimes insurance will not pay for viral titers or vitamin d levels without a payable diagnosis. But I am willing to order rabies virus antibody titers, vitamin D ,renal, cbc, hepatic, lipid for hyperlipid, reflux, viral exposure. I am willing to take over all her meds 30 day supply with same sig with 5 rf or 90 day with 1 but I need an appt for annual exam before this runs out.

## 2013-07-21 MED ORDER — FLUTICASONE PROPIONATE HFA 110 MCG/ACT IN AERO: 2.0000 | INHALATION_SPRAY | Freq: Two times a day (BID) | RESPIRATORY_TRACT | Status: DC

## 2013-07-21 NOTE — Telephone Encounter (Signed)
OK to send a 3 month supply of Flovent in. Unfortunately we have lost our lab person at [facility] so she can come here ahead of time or we can do labs while she is here. We no longer require fasting for the cholesterol. Labs renal, cbc, tsh, hepatic, lipid

## 2013-07-21 NOTE — Telephone Encounter (Signed)
Lab order placed and Tami Mcdonald had already sent in the Flovent.   I will call patient on Monday

## 2013-07-21 NOTE — Telephone Encounter (Signed)
Patient scheduled CPE and would like to go to [facility] the week before in December and have all labs requested drawn [includin titers]; please order labs for [facility].  Pt only needed Flovent HFA to be sent to [facility] and will refill other medications after changing employment and/or Insurance in January; Rx to pharmacySLS

## 2013-07-24 NOTE — Telephone Encounter (Signed)
Pt informed and voiced understanding. Pt states she will come a week earlier so MD can discuss labs

## 2013-08-24 LAB — CBC
HCT: 37.4 % (ref 36.0–46.0)
Hemoglobin: 12.8 g/dL (ref 12.0–15.0)
MCH: 29 pg (ref 26.0–34.0)
MCHC: 34.2 g/dL (ref 30.0–36.0)

## 2013-08-24 LAB — HEPATIC FUNCTION PANEL
AST: 20 U/L (ref 0–37)
Albumin: 4.4 g/dL (ref 3.5–5.2)
Alkaline Phosphatase: 53 U/L (ref 39–117)
Total Protein: 6.8 g/dL (ref 6.0–8.3)

## 2013-08-24 LAB — LIPID PANEL: Cholesterol: 174 mg/dL (ref 0–200)

## 2013-08-24 LAB — RENAL FUNCTION PANEL
BUN: 7 mg/dL (ref 6–23)
Chloride: 103 mEq/L (ref 96–112)
Glucose, Bld: 92 mg/dL (ref 70–99)
Potassium: 4.1 mEq/L (ref 3.5–5.3)

## 2013-08-29 ENCOUNTER — Encounter: Payer: Self-pay | Admitting: Family Medicine

## 2013-08-29 ENCOUNTER — Ambulatory Visit (INDEPENDENT_AMBULATORY_CARE_PROVIDER_SITE_OTHER): Payer: BC Managed Care – PPO | Admitting: Family Medicine

## 2013-08-29 VITALS — BP 122/74 | HR 65 | Temp 98.4°F | Ht 63.5 in | Wt 135.1 lb

## 2013-08-29 DIAGNOSIS — G43909 Migraine, unspecified, not intractable, without status migrainosus: Secondary | ICD-10-CM

## 2013-08-29 DIAGNOSIS — Z0000 Encounter for general adult medical examination without abnormal findings: Secondary | ICD-10-CM

## 2013-08-29 DIAGNOSIS — J309 Allergic rhinitis, unspecified: Secondary | ICD-10-CM

## 2013-08-29 NOTE — Progress Notes (Signed)
Patient ID: Unnamed Buer, female   DOB: 1975/11/14, 37 y.o.   MRN: [id number] Tami Mcdonald [id number] 1976-06-02 08/29/2013      Progress Note-Follow Up  Subjective  Chief Complaint  Chief Complaint  Patient presents with  . Annual Exam    physical    HPI  Patient is a 37 year old Caucasian female who is in today for and no exam. She's generally doing well. Does note that her migraines have been doing much better he recently bumped back up to 1 or 2 a month. For the most part she is happy with her decreased symptoms. She is returning to work in am clinics and is worried her allergies with sugar more migraines. Is needing her Flovent infrequently. Has been avoiding gluten and feels better. No recent illness. Her acne is recently worsened somewhat. No chest pain, palpitations or shortness of breath. No GI or GU concerns  Past Medical History  Diagnosis Date  . Chicken pox as a child  . Asthma 5 yrs old  . Allergy     seasonal, pets  . Reflux   . Vitamin D deficiency     history of  . Migraines   . Raynaud's disease 2005  . Allergic rhinitis     seasonal, pets   . Preventative health care 07/08/2012  . Vitamin D deficiency   . GERD (gastroesophageal reflux disease)     surgically corrected with nissen fundoplication, sliding HH  . Low back pain 07/08/2012  . Fatigue 07/08/2012  . Hypoglycemia 07/08/2012  . Anxiety 07/08/2012  . Skin lesion of left leg 05/20/2013  . Internal nasal lesion 05/20/2013    Past Surgical History  Procedure Laterality Date  . Gastric fundoplication  11 yrs ago  . Mandible surgery  97,98,99    X 3  . Cesarean section  05 and 07    X 2  . Tonsillectomy  37 yrs old  . Wisdom tooth extraction  2000  . Eye surgery      chest nut burrs in eye  . Hemorrhoid surgery      lanced during pregnancy    Family History  Problem Relation Age of Onset  . Stroke Mother     2 minor  . Hypertension Mother   . Migraines Mother   . Cataracts  Father   . Hypertension Father   . Hyperlipidemia Father   . Asthma Father   . Allergy (severe) Father   . Other Brother     brain injury, fell down stairs on head  . Stroke Maternal Grandmother   . Hypertension Maternal Grandmother   . Migraines Maternal Grandmother   . Heart attack Maternal Grandfather   . Hyperlipidemia Paternal Grandmother   . Hyperlipidemia Paternal Grandfather   . Cancer Paternal Aunt     breast    History   Social History  . Marital Status: Married    Spouse Name: N/A    Number of Children: N/A  . Years of Education: N/A   Occupational History  . Not on file.   Social History Main Topics  . Smoking status: Never Smoker   . Smokeless tobacco: Never Used  . Alcohol Use: Yes     Comment: occasional  . Drug Use: No  . Sexual Activity: Yes    Partners: Male   Other Topics Concern  . Not on file   Social History Narrative  . No narrative on file    Current Outpatient Prescriptions on File Prior to Visit  Medication Sig Dispense Refill  . albuterol (PROVENTIL HFA;VENTOLIN HFA) 108 (90 BASE) MCG/ACT inhaler Inhale 2 puffs into the lungs every 6 (six) hours as needed for wheezing.      Marland Kitchen azelastine (ASTELIN) 137 MCG/SPRAY nasal spray Place 2 sprays into the nose 2 (two) times daily. Use in each nostril as directed      . calcium-vitamin D (OSCAL WITH D) 500-200 MG-UNIT per tablet Take 1 tablet by mouth as directed. 2-3 times a week      . fexofenadine (KP FEXOFENADINE HCL) 180 MG tablet Take 1 tablet (180 mg total) by mouth daily.      . fluticasone (FLOVENT HFA) 110 MCG/ACT inhaler Inhale 2 puffs into the lungs 2 (two) times daily.  3 Inhaler  3  . HYDROcodone-acetaminophen (NORCO/VICODIN) 5-325 MG per tablet Take 1 tablet by mouth every 8 (eight) hours as needed for pain.  30 tablet  0  . Krill Oil CAPS Take by mouth. Mega red- 1-2 week      . montelukast (SINGULAIR) 10 MG tablet Take 10 mg by mouth at bedtime.      . Multiple Vitamin  (MULTIVITAMIN) tablet Take 1 tablet by mouth daily.       No current facility-administered medications on file prior to visit.    Allergies  Allergen Reactions  . Vistaril [Hydroxyzine Hcl]   . Albuterol Palpitations    Tremulousness, anxiety  . Penicillins Rash  . Sulfa Antibiotics Nausea And Vomiting and Rash    Review of Systems  Review of Systems  Constitutional: Negative for fever and malaise/fatigue.  HENT: Negative for congestion.   Eyes: Negative for discharge.  Respiratory: Negative for shortness of breath.   Cardiovascular: Negative for chest pain, palpitations and leg swelling.  Gastrointestinal: Negative for nausea, abdominal pain and diarrhea.  Genitourinary: Negative for dysuria.  Musculoskeletal: Negative for falls.  Skin: Negative for rash.  Neurological: Negative for loss of consciousness and headaches.  Endo/Heme/Allergies: Negative for polydipsia.  Psychiatric/Behavioral: Negative for depression and suicidal ideas. The patient is not nervous/anxious and does not have insomnia.     Objective  BP 122/74  Pulse 65  Temp(Src) 98.4 F (36.9 C) (Oral)  Ht 5' 3.5" (1.613 m)  Wt 135 lb 1.3 oz (61.272 kg)  BMI 23.55 kg/m2  SpO2 98%  LMP 08/14/2013  Physical Exam  Physical Exam  Constitutional: She is oriented to person, place, and time and well-developed, well-nourished, and in no distress. No distress.  HENT:  Head: Normocephalic and atraumatic.  Right Ear: External ear normal.  Left Ear: External ear normal.  Nose: Nose normal.  Mouth/Throat: Oropharynx is clear and moist. No oropharyngeal exudate.  Eyes: Conjunctivae are normal. Pupils are equal, round, and reactive to light. Right eye exhibits no discharge. Left eye exhibits no discharge. No scleral icterus.  Neck: Normal range of motion. Neck supple. No thyromegaly present.  Cardiovascular: Normal rate, regular rhythm, normal heart sounds and intact distal pulses.   No murmur  heard. Pulmonary/Chest: Effort normal and breath sounds normal. No respiratory distress. She has no wheezes. She has no rales.  Abdominal: Soft. Bowel sounds are normal. She exhibits no distension and no mass. There is no tenderness.  Musculoskeletal: Normal range of motion. She exhibits no edema and no tenderness.  Lymphadenopathy:    She has no cervical adenopathy.  Neurological: She is alert and oriented to person, place, and time. She has normal reflexes. No cranial nerve deficit. Coordination normal.  Skin: Skin is warm and  dry. No rash noted. She is not diaphoretic.  Psychiatric: Mood, memory and affect normal.    Lab Results  Component Value Date   TSH 1.976 08/24/2013   Lab Results  Component Value Date   WBC 4.1 08/24/2013   HGB 12.8 08/24/2013   HCT 37.4 08/24/2013   MCV 84.6 08/24/2013   PLT 300 08/24/2013   Lab Results  Component Value Date   CREATININE 0.80 08/24/2013   BUN 7 08/24/2013   NA 141 08/24/2013   K 4.1 08/24/2013   CL 103 08/24/2013   CO2 27 08/24/2013   Lab Results  Component Value Date   ALT 21 08/24/2013   AST 20 08/24/2013   ALKPHOS 53 08/24/2013   BILITOT 0.7 08/24/2013   Lab Results  Component Value Date   CHOL 174 08/24/2013   Lab Results  Component Value Date   HDL 71 08/24/2013   Lab Results  Component Value Date   LDLCALC 85 08/24/2013   Lab Results  Component Value Date   TRIG 89 08/24/2013   Lab Results  Component Value Date   CHOLHDL 2.5 08/24/2013     Assessment & Plan  Preventative health care Encouraged heart healthy diet and regular exercise. Avoid trans fats, get 7-8 hours of sleep daily. Annual labs reviewed with patient  Allergic rhinitis Doing better but can add Astelin prn  Migraines improved now only 1-2 a month, was even better but has recently increased slightly. Encouraged small frequent meals and adequate hydration. Is returning to work as a Administrator, [employer] Service in the clinic so is worried her allergies will  flare.   R

## 2013-08-29 NOTE — Progress Notes (Signed)
Pre visit review using our clinic review tool, if applicable. No additional management support is needed unless otherwise documented below in the visit note. 

## 2013-08-29 NOTE — Patient Instructions (Signed)

## 2013-09-03 NOTE — Assessment & Plan Note (Signed)
improved now only 1-2 a month, was even better but has recently increased slightly. Encouraged small frequent meals and adequate hydration. 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

## 2013-09-03 NOTE — Assessment & Plan Note (Signed)
Doing better but can add Astelin prn

## 2013-09-03 NOTE — Assessment & Plan Note (Signed)
Encouraged heart healthy diet and regular exercise. Avoid trans fats, get 7-8 hours of sleep daily. Annual labs reviewed with patient

## 2013-09-14 ENCOUNTER — Telehealth: Payer: Self-pay

## 2013-09-14 NOTE — Telephone Encounter (Signed)
They still report in computer as pending, please check with lab to see if results are available

## 2013-09-14 NOTE — Telephone Encounter (Signed)
Please advise 

## 2013-09-14 NOTE — Telephone Encounter (Signed)
Patient left a vm requesting rabies titer results from mid December?

## 2013-09-15 NOTE — Telephone Encounter (Signed)
Gave to [place] with [facility]

## 2013-09-15 NOTE — Telephone Encounter (Signed)
Katrina with [facility] states the test takes 30 days for the results

## 2013-09-18 LAB — RABIES VIRUS ANTIBODY TITER

## 2013-10-03 ENCOUNTER — Telehealth: Payer: Self-pay | Admitting: Family Medicine

## 2013-10-03 DIAGNOSIS — J45909 Unspecified asthma, uncomplicated: Secondary | ICD-10-CM

## 2013-10-03 NOTE — Telephone Encounter (Signed)
PATIENT IS USING FLOVENT 110MCG 1 PUFF IN THE AM AND 2 PUFFS IN THE PM.  SHE WOULD LIKE A STRONGER INHALER TO USE IN THE PM SO SHE WILL ONLY HAVE TO TAKE 1 PUFF AND THEN BOTH INHALERS WILL LAST LONGER.  IF THIS CAN BE DONE PLEASE SEND 90 DAY SUPPLY TO [facility]

## 2013-10-03 NOTE — Telephone Encounter (Signed)
I am not opposed but her insurance may veto. Flovent 110 mcg 1 puff po in am and Flovent 220 mcg 1 puff po in pm disp #1 of each with 3 rf

## 2013-10-03 NOTE — Telephone Encounter (Signed)
Please advise 

## 2013-10-03 NOTE — Telephone Encounter (Signed)
SHE IS USING FLOVENT 110MCG 1 PUFF IN THE AM AND 2 PUFFS IN THE PM.  SHE WONDERED IF SHE COULD GET A STRONGER INHALER TO USE IN THE EVENING SO SHE WOULD ONLY HAVE TO USE 1 PUFF AND THEN THE INHALERS WOULD LAST LONGER.  IF THIS IS POSSIBLE PLEASE SEND 90 DAY SUPPLY TO [facility].

## 2013-10-04 MED ORDER — FLUTICASONE PROPIONATE HFA 110 MCG/ACT IN AERO: 1.0000 | INHALATION_SPRAY | Freq: Every morning | RESPIRATORY_TRACT | Status: DC

## 2013-10-04 MED ORDER — FLUTICASONE PROPIONATE HFA 220 MCG/ACT IN AERO: 1.0000 | INHALATION_SPRAY | Freq: Every evening | RESPIRATORY_TRACT | Status: DC

## 2013-10-04 NOTE — Telephone Encounter (Signed)
Rx request to pharmacy; pt informed/SLS  

## 2013-12-21 ENCOUNTER — Ambulatory Visit (INDEPENDENT_AMBULATORY_CARE_PROVIDER_SITE_OTHER): Payer: 59 | Admitting: Obstetrics and Gynecology

## 2013-12-21 ENCOUNTER — Encounter: Payer: Self-pay | Admitting: Obstetrics and Gynecology

## 2013-12-21 VITALS — BP 120/70 | HR 64 | Ht 63.75 in | Wt 138.0 lb

## 2013-12-21 DIAGNOSIS — Z01419 Encounter for gynecological examination (general) (routine) without abnormal findings: Secondary | ICD-10-CM

## 2013-12-21 DIAGNOSIS — Z0000 Encounter for general adult medical examination without abnormal findings: Secondary | ICD-10-CM

## 2013-12-21 NOTE — Patient Instructions (Signed)
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
## 2014-01-04 ENCOUNTER — Other Ambulatory Visit: Payer: Self-pay | Admitting: Obstetrics and Gynecology

## 2014-01-04 DIAGNOSIS — Z1231 Encounter for screening mammogram for malignant neoplasm of breast: Secondary | ICD-10-CM

## 2014-01-11 ENCOUNTER — Other Ambulatory Visit: Payer: Self-pay

## 2014-01-11 DIAGNOSIS — J45909 Unspecified asthma, uncomplicated: Secondary | ICD-10-CM

## 2014-01-11 MED ORDER — FLUTICASONE PROPIONATE HFA 110 MCG/ACT IN AERO: 1.0000 | INHALATION_SPRAY | Freq: Every morning | RESPIRATORY_TRACT | Status: DC

## 2014-01-11 MED ORDER — FLUTICASONE PROPIONATE HFA 220 MCG/ACT IN AERO: 1.0000 | INHALATION_SPRAY | Freq: Every evening | RESPIRATORY_TRACT | Status: DC

## 2014-01-11 MED ORDER — MONTELUKAST SODIUM 10 MG PO TABS: 10.0000 mg | ORAL_TABLET | Freq: Every day | ORAL | Status: DC

## 2014-01-11 MED ORDER — AZELASTINE HCL 0.1 % NA SOLN: 2.0000 | Freq: Two times a day (BID) | NASAL | Status: DC

## 2014-03-13 ENCOUNTER — Other Ambulatory Visit: Payer: Self-pay

## 2014-03-13 MED ORDER — HYDROCODONE-ACETAMINOPHEN 5-325 MG PO TABS: 1.0000 | ORAL_TABLET | Freq: Three times a day (TID) | ORAL | Status: DC | PRN

## 2014-03-13 NOTE — Telephone Encounter (Signed)
Patient left a message stating that she would like a refill of her hydrocodone? Pt stated that she takes this for migraines and took her last one today?   Last RX was done on 05-04-13 quantity 30 with 0 refills  Please advise?  Patient also stated on vm that she left a message with the receptionist today to get a message back (I don't see any messages?)

## 2014-04-11 ENCOUNTER — Ambulatory Visit (INDEPENDENT_AMBULATORY_CARE_PROVIDER_SITE_OTHER): Payer: 59 | Admitting: Family Medicine

## 2014-04-11 ENCOUNTER — Encounter: Payer: Self-pay | Admitting: Nurse Practitioner

## 2014-04-11 ENCOUNTER — Ambulatory Visit (INDEPENDENT_AMBULATORY_CARE_PROVIDER_SITE_OTHER): Payer: 59 | Admitting: Nurse Practitioner

## 2014-04-11 VITALS — BP 105/69 | HR 63 | Temp 97.4°F | Ht 63.5 in | Wt 138.0 lb

## 2014-04-11 VITALS — BP 102/60 | HR 61 | Temp 97.9°F | Resp 16 | Ht 63.5 in | Wt 138.4 lb

## 2014-04-11 DIAGNOSIS — K6289 Other specified diseases of anus and rectum: Secondary | ICD-10-CM

## 2014-04-11 DIAGNOSIS — K645 Perianal venous thrombosis: Secondary | ICD-10-CM

## 2014-04-11 MED ORDER — DILTIAZEM GEL 2 %: 1.0000 "application " | Freq: Two times a day (BID) | CUTANEOUS | Status: DC | PRN

## 2014-04-11 MED ORDER — LIDOCAINE (ANORECTAL) 5 % EX CREA: TOPICAL_CREAM | CUTANEOUS | Status: DC

## 2014-04-11 NOTE — Progress Notes (Signed)
Subjective:     Tami Mcdonald is a 38 y.o. female who presents for evaluation of possible hemorrhoids. Onset of symptoms was sudden. Symptoms have been gradually worsening since that time. Symptoms include: anorectal itching, pain with sitting, painful defecation and painful perianal swelling. Patient denies maroon colored stools and melena. Treatment to date has been nothing. She has Hx of thrombosed hemorrhoid when pregnant 9 ya. No problems since then. No recent constipation.  The following portions of the patient's history were reviewed and updated as appropriate: allergies, current medications, past medical history, past social history, past surgical history and problem list.  Review of Systems Pertinent items are noted in HPI.   Objective:    BP 105/69  Pulse 63  Temp(Src) 97.4 F (36.3 C) (Temporal)  Ht 5' 3.5" (1.613 m)  Wt 138 lb (62.596 kg)  BMI 24.06 kg/m2  SpO2 99% Rectal: purple external hemorrhoid, about 0.5cm    Assessment:    Thrombosed external hemorrhoid   Plan:    rectal lidocaine. hydrocodone. Pt to return tomorrow to see Dr Anitra Lauth for excision.  May go to UC tonight if discomfort gets worse.

## 2014-04-11 NOTE — Assessment & Plan Note (Signed)
Moderate pain. Appt w/Dr McGowen tomorrow for excision of hemorrhoid Lidocaine gel, hydrocodone.

## 2014-04-11 NOTE — Patient Instructions (Signed)
Hemorrhoids Hemorrhoids are puffy (swollen) veins around the rectum or anus. Hemorrhoids can cause pain, itching, bleeding, or irritation. HOME CARE  Eat foods with fiber, such as whole grains, beans, nuts, fruits, and vegetables. Ask your doctor about taking products with added fiber in them (fibersupplements).  Drink enough fluid to keep your pee (urine) clear or pale yellow.  Exercise often.  Go to the bathroom when you have the urge to poop. Do not wait.  Avoid straining to poop (bowel movement).  Keep the butt area dry and clean. Use wet toilet paper or moist paper towels.  Medicated creams and medicine inserted into the anus (anal suppository) may be used or applied as told.  Only take medicine as told by your doctor.  Take a warm water bath (sitz bath) for 15-20 minutes to ease pain. Do this 3-4 times a day.  Place ice packs on the area if it is tender or puffy. Use the ice packs between the warm water baths.  Put ice in a plastic bag.  Place a towel between your skin and the bag.  Leave the ice on for 15-20 minutes, 03-04 times a day.  Do not use a donut-shaped pillow or sit on the toilet for a long time. GET HELP RIGHT AWAY IF:   You have more pain that is not controlled by treatment or medicine.  You have bleeding that will not stop.  You have trouble or are unable to poop (bowel movement).  You have pain or puffiness outside the area of the hemorrhoids. MAKE SURE YOU:   Understand these instructions.  Will watch your condition.  Will get help right away if you are not doing well or get worse. Document Released: 06/02/2008 Document Revised: 08/10/2012 Document Reviewed: 07/05/2012 [facility] Patient Information 2015 [place], [place]. This information is not intended to replace advice given to you by your health care provider. Make sure you discuss any questions you have with your health care provider.

## 2014-04-11 NOTE — Progress Notes (Signed)
[facility]. Local anesthesia with 1% lidocaine with epinephrine. Elliptical incision made along lesion. Blood clots evacuated. Hemorrhoid irrigated. Patient tolerated well.

## 2014-04-11 NOTE — Progress Notes (Signed)
Pre visit review using our clinic review tool, if applicable. No additional management support is needed unless otherwise documented below in the visit note. 
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
## 2014-04-11 NOTE — Patient Instructions (Addendum)
Return tomorrow at 11 to see Dr Anitra Lauth.  In the meantime, you may apply lidocaine cream to rectum up to 6 times daily as needed for pain. Also, you may use hydrocodone if needed for pain.

## 2014-04-12 ENCOUNTER — Ambulatory Visit: Payer: 59 | Admitting: Family Medicine

## 2014-04-12 ENCOUNTER — Encounter (INDEPENDENT_AMBULATORY_CARE_PROVIDER_SITE_OTHER): Payer: 59 | Admitting: General Surgery

## 2014-05-01 ENCOUNTER — Ambulatory Visit (INDEPENDENT_AMBULATORY_CARE_PROVIDER_SITE_OTHER): Payer: 59 | Admitting: Family Medicine

## 2014-05-01 ENCOUNTER — Encounter: Payer: Self-pay | Admitting: Family Medicine

## 2014-05-01 VITALS — BP 110/70 | HR 80 | Temp 97.8°F | Ht 63.5 in | Wt 140.0 lb

## 2014-05-01 DIAGNOSIS — B958 Unspecified staphylococcus as the cause of diseases classified elsewhere: Secondary | ICD-10-CM

## 2014-05-01 DIAGNOSIS — G43009 Migraine without aura, not intractable, without status migrainosus: Secondary | ICD-10-CM

## 2014-05-01 DIAGNOSIS — J302 Other seasonal allergic rhinitis: Secondary | ICD-10-CM

## 2014-05-01 DIAGNOSIS — J45909 Unspecified asthma, uncomplicated: Secondary | ICD-10-CM

## 2014-05-01 DIAGNOSIS — G43909 Migraine, unspecified, not intractable, without status migrainosus: Secondary | ICD-10-CM

## 2014-05-01 DIAGNOSIS — J4520 Mild intermittent asthma, uncomplicated: Secondary | ICD-10-CM

## 2014-05-01 DIAGNOSIS — J3489 Other specified disorders of nose and nasal sinuses: Secondary | ICD-10-CM

## 2014-05-01 DIAGNOSIS — J3089 Other allergic rhinitis: Secondary | ICD-10-CM

## 2014-05-01 MED ORDER — MUPIROCIN 2 % EX OINT: 1.0000 "application " | TOPICAL_OINTMENT | Freq: Two times a day (BID) | CUTANEOUS | Status: DC | PRN

## 2014-05-01 MED ORDER — HYDROCODONE-ACETAMINOPHEN 5-325 MG PO TABS: 1.0000 | ORAL_TABLET | Freq: Three times a day (TID) | ORAL | Status: DC | PRN

## 2014-05-01 NOTE — Progress Notes (Signed)
Pre visit review using our clinic review tool, if applicable. No additional management support is needed unless otherwise documented below in the visit note. 

## 2014-05-01 NOTE — Patient Instructions (Signed)
Anusol HC suppositories at bedtime as needed  Hemorrhoids Hemorrhoids are swollen veins around the rectum or anus. There are two types of hemorrhoids:   Internal hemorrhoids. These occur in the veins just inside the rectum. They may poke through to the outside and become irritated and painful.  External hemorrhoids. These occur in the veins outside the anus and can be felt as a painful swelling or hard lump near the anus. CAUSES  Pregnancy.   Obesity.   Constipation or diarrhea.   Straining to have a bowel movement.   Sitting for long periods on the toilet.  Heavy lifting or other activity that caused you to strain.  Anal intercourse. SYMPTOMS   Pain.   Anal itching or irritation.   Rectal bleeding.   Fecal leakage.   Anal swelling.   One or more lumps around the anus.  DIAGNOSIS  Your caregiver may be able to diagnose hemorrhoids by visual examination. Other examinations or tests that may be performed include:   Examination of the rectal area with a gloved hand (digital rectal exam).   Examination of anal canal using a small tube (scope).   A blood test if you have lost a significant amount of blood.  A test to look inside the colon (sigmoidoscopy or colonoscopy). TREATMENT Most hemorrhoids can be treated at home. However, if symptoms do not seem to be getting better or if you have a lot of rectal bleeding, your caregiver may perform a procedure to help make the hemorrhoids get smaller or remove them completely. Possible treatments include:   Placing a rubber band at the base of the hemorrhoid to cut off the circulation (rubber band ligation).   Injecting a chemical to shrink the hemorrhoid (sclerotherapy).   Using a tool to burn the hemorrhoid (infrared light therapy).   Surgically removing the hemorrhoid (hemorrhoidectomy).   Stapling the hemorrhoid to block blood flow to the tissue (hemorrhoid stapling).  HOME CARE INSTRUCTIONS   Eat  foods with fiber, such as whole grains, beans, nuts, fruits, and vegetables. Ask your doctor about taking products with added fiber in them (fibersupplements).  Increase fluid intake. Drink enough water and fluids to keep your urine clear or pale yellow.   Exercise regularly.   Go to the bathroom when you have the urge to have a bowel movement. Do not wait.   Avoid straining to have bowel movements.   Keep the anal area dry and clean. Use wet toilet paper or moist towelettes after a bowel movement.   Medicated creams and suppositories may be used or applied as directed.   Only take over-the-counter or prescription medicines as directed by your caregiver.   Take warm sitz baths for 15-20 minutes, 3-4 times a day to ease pain and discomfort.   Place ice packs on the hemorrhoids if they are tender and swollen. Using ice packs between sitz baths may be helpful.   Put ice in a plastic bag.   Place a towel between your skin and the bag.   Leave the ice on for 15-20 minutes, 3-4 times a day.   Do not use a donut-shaped pillow or sit on the toilet for long periods. This increases blood pooling and pain.  SEEK MEDICAL CARE IF:  You have increasing pain and swelling that is not controlled by treatment or medicine.  You have uncontrolled bleeding.  You have difficulty or you are unable to have a bowel movement.  You have pain or inflammation outside the area of the  hemorrhoids. MAKE SURE YOU:  Understand these instructions.  Will watch your condition.  Will get help right away if you are not doing well or get worse. Document Released: 08/21/2000 Document Revised: 08/10/2012 Document Reviewed: 06/28/2012 [facility] Patient Information 2015 [place], [place]. This information is not intended to replace advice given to you by your health care provider. Make sure you discuss any questions you have with your health care provider.

## 2014-05-02 ENCOUNTER — Encounter: Payer: Self-pay | Admitting: Family Medicine

## 2014-05-02 NOTE — Assessment & Plan Note (Signed)
None at the present time. May use Mupirocin and H2O2 as needed. Continue probiotics and consider referral to ENT for further consideration if continues to recur

## 2014-05-02 NOTE — Assessment & Plan Note (Signed)
Infrequent now and responsive to Hydrocodone as needed. Allowed refill

## 2014-05-02 NOTE — Assessment & Plan Note (Signed)
Has been able to keep her Flovent down at 110 bid for quite a while, with some recent increased congestion has trended back up to 110 in am and 220 in pm with good results.
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
## 2014-05-02 NOTE — Assessment & Plan Note (Signed)
Much better without gluten and dairy in diet. No bad flares has been cutting back on Singulair without any concerns

## 2014-05-23 ENCOUNTER — Encounter: Payer: Self-pay | Admitting: Family Medicine

## 2014-06-04 ENCOUNTER — Ambulatory Visit (INDEPENDENT_AMBULATORY_CARE_PROVIDER_SITE_OTHER): Payer: 59

## 2014-06-04 DIAGNOSIS — Z23 Encounter for immunization: Secondary | ICD-10-CM

## 2014-06-05 ENCOUNTER — Ambulatory Visit: Payer: 59

## 2014-07-09 ENCOUNTER — Encounter: Payer: Self-pay | Admitting: Family Medicine

## 2014-08-05 ENCOUNTER — Other Ambulatory Visit: Payer: Self-pay | Admitting: Family Medicine

## 2014-10-01 ENCOUNTER — Ambulatory Visit: Payer: BC Managed Care – PPO

## 2014-10-08 ENCOUNTER — Ambulatory Visit
Admission: RE | Admit: 2014-10-08 | Discharge: 2014-10-08 | Disposition: A | Discharge status: Home / Self Care | Payer: 59 | Source: Ambulatory Visit | Attending: Obstetrics and Gynecology | Admitting: Obstetrics and Gynecology

## 2014-10-08 DIAGNOSIS — Z1231 Encounter for screening mammogram for malignant neoplasm of breast: Secondary | ICD-10-CM

## 2014-12-05 ENCOUNTER — Other Ambulatory Visit: Payer: Self-pay | Admitting: Family Medicine

## 2015-01-04 ENCOUNTER — Other Ambulatory Visit: Payer: Self-pay | Admitting: Family Medicine

## 2015-01-10 ENCOUNTER — Encounter: Payer: Self-pay | Admitting: Nurse Practitioner

## 2015-01-10 ENCOUNTER — Ambulatory Visit (INDEPENDENT_AMBULATORY_CARE_PROVIDER_SITE_OTHER): Payer: 59 | Admitting: Nurse Practitioner

## 2015-01-10 VITALS — BP 125/87 | HR 70 | Temp 98.0°F | Ht 63.5 in | Wt 135.0 lb

## 2015-01-10 DIAGNOSIS — J029 Acute pharyngitis, unspecified: Secondary | ICD-10-CM | POA: Diagnosis not present

## 2015-01-10 LAB — POCT RAPID STREP A (OFFICE): Rapid Strep A Screen: NEGATIVE

## 2015-01-10 NOTE — Patient Instructions (Addendum)
This is likely a viral sore throat/upper respiratory infection. However, if your culture comes back growing bacteria, I will call in an antibiotic. In the meantime, you may use benzocaine throat lozenges or throat spray for comfort. Salt water gargles (1/4 cup warm water mixed with 1/4 tsp salt) twice daily & listerene gargles twice daily. Rest, sip fluids every hour.  Feel better!  Sore Throat A sore throat is pain, burning, irritation, or scratchiness of the throat. There is often pain or tenderness when swallowing or talking. A sore throat may be accompanied by other symptoms, such as coughing, sneezing, fever, and swollen neck glands. A sore throat is often the first sign of another sickness, such as a cold, flu, strep throat, or mononucleosis (commonly known as mono). Most sore throats go away without medical treatment. CAUSES  The most common causes of a sore throat include:  A viral infection, such as a cold, flu, or mono.  A bacterial infection, such as strep throat, tonsillitis, or whooping cough.  Seasonal allergies.  Dryness in the air.  Irritants, such as smoke or pollution.  Gastroesophageal reflux disease (GERD). HOME CARE INSTRUCTIONS   Only take over-the-counter medicines as directed by your caregiver.  Drink enough fluids to keep your urine clear or pale yellow.  Rest as needed.  Try using throat sprays, lozenges, or sucking on hard candy to ease any pain (if older than 4 years or as directed).  Sip warm liquids, such as broth, herbal tea, or warm water with honey to relieve pain temporarily. You may also eat or drink cold or frozen liquids such as frozen ice pops.  Gargle with salt water (mix 1 tsp salt with 8 oz of water).  Do not smoke and avoid secondhand smoke.  Put a cool-mist humidifier in your bedroom at night to moisten the air. You can also turn on a hot shower and sit in the bathroom with the door closed for 5 10 minutes. SEEK IMMEDIATE MEDICAL CARE  IF:  You have difficulty breathing.  You are unable to swallow fluids, soft foods, or your saliva.  You have increased swelling in the throat.  Your sore throat does not get better in 7 days.  You have nausea and vomiting.  You have a fever or persistent symptoms for more than 2 3 days.  You have a fever and your symptoms suddenly get worse. MAKE SURE YOU:   Understand these instructions.  Will watch your condition.  Will get help right away if you are not doing well or get worse. Document Released: 10/01/2004 Document Revised: 08/10/2012 Document Reviewed: 05/01/2012 [facility] Patient Information 2014 [place], [place].

## 2015-01-10 NOTE — Progress Notes (Signed)
   Subjective:    Patient ID: Tami Mcdonald, female    DOB: [birth year], 39 y.o.   MRN: [id number]  Sore Throat  This is a new problem. The current episode started in the past 7 days. The problem has been unchanged. Neither side of throat is experiencing more pain than the other. There has been no fever. The pain is mild. Associated symptoms include congestion, diarrhea (1 day, 2 dys ago) and headaches (nothing out of ordinary). Pertinent negatives include no abdominal pain, coughing, ear pain, hoarse voice, shortness of breath or swollen glands. She has had exposure to strep (both daughters pos, husband has URI symptoms). Treatments tried: regular allergy routine-oral antihistamines. The treatment provided moderate relief.      Review of Systems  HENT: Positive for congestion. Negative for ear pain and hoarse voice.   Respiratory: Negative for cough and shortness of breath.   Gastrointestinal: Positive for diarrhea (1 day, 2 dys ago). Negative for abdominal pain.  Neurological: Positive for headaches (nothing out of ordinary).       Objective:   Physical Exam  Constitutional: She is oriented to person, place, and time. She appears well-developed and well-nourished. No distress.  HENT:  Head: Normocephalic and atraumatic.  Right Ear: External ear normal.  Left Ear: External ear normal.  Mouth/Throat: Oropharynx is clear and moist. No oropharyngeal exudate.  Tonsils absent   Eyes: Conjunctivae are normal. Right eye exhibits no discharge. Left eye exhibits no discharge.  Neck: Normal range of motion. Neck supple. No thyromegaly present.  Cardiovascular: Normal rate, regular rhythm and normal heart sounds.   No murmur heard. Pulmonary/Chest: Effort normal and breath sounds normal.  Lymphadenopathy:    She has no cervical adenopathy.  Neurological: She is alert and oriented to person, place, and time.  Skin: Skin is warm and dry.  Psychiatric: She has a normal mood and affect.  Her behavior is normal. Thought content normal.  Vitals reviewed.         Assessment & Plan:  1. Sore throat likley viral - POCT rapid strep A-NEG - Upper Respiratory Culture Symptom management F/u PRN

## 2015-01-10 NOTE — Progress Notes (Signed)
Pre visit review using our clinic review tool, if applicable. No additional management support is needed unless otherwise documented below in the visit note. 

## 2015-01-12 ENCOUNTER — Other Ambulatory Visit: Payer: Self-pay | Admitting: Family Medicine

## 2015-01-13 LAB — CULTURE, UPPER RESPIRATORY: Organism ID, Bacteria: NORMAL

## 2015-01-14 ENCOUNTER — Other Ambulatory Visit: Payer: Self-pay | Admitting: Nurse Practitioner

## 2015-01-14 ENCOUNTER — Telehealth: Payer: Self-pay | Admitting: Nurse Practitioner

## 2015-01-14 DIAGNOSIS — J069 Acute upper respiratory infection, unspecified: Secondary | ICD-10-CM

## 2015-01-14 MED ORDER — HYDROCODONE-HOMATROPINE 5-1.5 MG/5ML PO SYRP: 5.0000 mL | ORAL_SOLUTION | Freq: Every evening | ORAL | Status: DC | PRN

## 2015-01-14 MED ORDER — BENZONATATE 200 MG PO CAPS: 200.0000 mg | ORAL_CAPSULE | Freq: Three times a day (TID) | ORAL | Status: DC | PRN

## 2015-01-14 NOTE — Telephone Encounter (Signed)
Please Advise

## 2015-01-14 NOTE — Telephone Encounter (Signed)
Tami Mcdonald's cough is getting worse. She would like to have a cough med RX called in. She was here last week.

## 2015-01-14 NOTE — Telephone Encounter (Signed)
Called and informed patient of lab results.

## 2015-01-14 NOTE — Telephone Encounter (Signed)
pls call pt: Strep culture neg. Viral respiratory/sore throat illness. Continue with comfort measures as discussed.

## 2015-01-18 ENCOUNTER — Ambulatory Visit (INDEPENDENT_AMBULATORY_CARE_PROVIDER_SITE_OTHER): Payer: 59 | Admitting: Nurse Practitioner

## 2015-01-18 ENCOUNTER — Encounter: Payer: Self-pay | Admitting: Nurse Practitioner

## 2015-01-18 VITALS — BP 128/76 | HR 71 | Temp 97.9°F | Ht 63.5 in | Wt 137.0 lb

## 2015-01-18 DIAGNOSIS — J069 Acute upper respiratory infection, unspecified: Secondary | ICD-10-CM

## 2015-01-18 MED ORDER — AZITHROMYCIN 250 MG PO TABS: ORAL_TABLET | ORAL | Status: DC

## 2015-01-18 NOTE — Progress Notes (Signed)
Pre visit review using our clinic review tool, if applicable. No additional management support is needed unless otherwise documented below in the visit note. 

## 2015-01-18 NOTE — Patient Instructions (Signed)
You may Start antibiotic.  Use cough syrup at night if cough is keeping you awake.  Use benzonatate capsules during day if having frequent dry cough.  Let us know if not improving or symptoms get worse.

## 2015-01-18 NOTE — Progress Notes (Signed)
   Subjective:    Patient ID: Tami Mcdonald, female    DOB: 1975-11-11, 39 y.o.   MRN: [id number]  HPI Comments: Pt was seen in ofc 1 wk ago w/sore throat. She called few days later & asked for "something" for cough.  She has not taken benzonatate caps or hydrocodone cough syrup as she was "afraid" of potential SE.  Cough This is a new problem. The current episode started 1 to 4 weeks ago (1 wk). The problem has been gradually worsening. The problem occurs hourly. The cough is productive of sputum. Associated symptoms include chest pain (substernal cp when coughs), chills, ear congestion, headaches and nasal congestion. Pertinent negatives include no ear pain, fever, sore throat, shortness of breath or wheezing. She has tried leukotriene antagonists and steroid inhaler (oral antihistamine) for the symptoms. The treatment provided mild relief. Her past medical history is significant for asthma.      Review of Systems  Constitutional: Positive for chills. Negative for fever.  HENT: Negative for ear pain and sore throat.   Respiratory: Positive for cough. Negative for shortness of breath and wheezing.   Cardiovascular: Positive for chest pain (substernal cp when coughs).  Neurological: Positive for headaches.       Objective:   Physical Exam  Constitutional: She is oriented to person, place, and time. She appears well-developed and well-nourished. No distress.  HENT:  Head: Normocephalic and atraumatic.  Right Ear: External ear normal.  Left Ear: External ear normal.  Mouth/Throat: Oropharynx is clear and moist. No oropharyngeal exudate.  Tonsils absent  Eyes: Conjunctivae are normal. Right eye exhibits no discharge. Left eye exhibits no discharge.  Neck: Normal range of motion. Neck supple. No thyromegaly present.  Cardiovascular: Normal rate, regular rhythm and normal heart sounds.   Pulmonary/Chest: Effort normal and breath sounds normal.  Lymphadenopathy:    She has no  cervical adenopathy.  Neurological: She is alert and oriented to person, place, and time.  Skin: Skin is warm and dry.  Psychiatric: She has a normal mood and affect. Her behavior is normal. Thought content normal.  Vitals reviewed.         Assessment & Plan:  1. Upper respiratory infection with cough and congestion Pt very concerned her asthma will flare if she does not use ABX - azithromycin (ZITHROMAX) 250 MG tablet; Take 2 T PO day 1, then 1T PO days 2-5.  Dispense: 6 tablet; Refill: 0 See pt instructions F/u prn

## 2015-01-21 ENCOUNTER — Telehealth: Payer: Self-pay

## 2015-01-21 DIAGNOSIS — J45901 Unspecified asthma with (acute) exacerbation: Secondary | ICD-10-CM

## 2015-01-21 MED ORDER — PREDNISONE 20 MG PO TABS: ORAL_TABLET | ORAL | Status: AC

## 2015-01-21 NOTE — Telephone Encounter (Signed)
OK, pls eRx prednisone 94m tabs, 2 tabs po qd x 5d, #10.-thx

## 2015-01-21 NOTE — Telephone Encounter (Signed)
Please Advise.Marland KitchenMarland KitchenLayne Not in office today: Patient was seen this past Friday for cough, was given Zithromax, benzonatate capsules, and hydrocodone syrup. She states that she is starting to feel better but that her chest feels tighter like her asthma is starting to flare up and is wondering if she can get some steroids called in. She has said that she has not heard any wheezing but has noticed that her peak flow is down and her Albuterol inhaler is helping her.  [facility].

## 2015-01-21 NOTE — Telephone Encounter (Signed)
Called and informed patient that meds were sent in.

## 2015-02-20 ENCOUNTER — Telehealth: Payer: Self-pay | Admitting: Family Medicine

## 2015-02-20 DIAGNOSIS — G43809 Other migraine, not intractable, without status migrainosus: Secondary | ICD-10-CM

## 2015-02-20 NOTE — Telephone Encounter (Signed)
Relation to pt: self Call back number: [phone]   Reason for call:  Pt scheduled for pysical appointment for 08/29/2015 requesting a refill HYDROcodone-acetaminophen (NORCO/VICODIN) 5-325 MG per tablet  to hold her over until appointment. Please advise

## 2015-02-20 NOTE — Telephone Encounter (Signed)
Ok to refill Hydrocodone, same sig, same number

## 2015-02-20 NOTE — Telephone Encounter (Signed)
Last filled: 05/01/14 Amt: 30, 0 refills Last OV: 05/01/14 with Dr. Charlett Blake and 01/18/15 with Nicky Pugh, NP   Pt states she needs medication for migraine headaches.  She has 5 or 6 left, but plans to go on vacation soon and does not want to run out.    Next scheduled appointment:  08/29/2015 for CPE  Please advise.

## 2015-02-21 MED ORDER — HYDROCODONE-ACETAMINOPHEN 5-325 MG PO TABS: 1.0000 | ORAL_TABLET | Freq: Three times a day (TID) | ORAL | Status: DC | PRN

## 2015-02-21 NOTE — Telephone Encounter (Signed)
Printed prescription and on counter for signature.

## 2015-02-21 NOTE — Telephone Encounter (Signed)
Called the patient left a detailed message that prescription is ready for pickup at the front desk.

## 2015-02-25 ENCOUNTER — Ambulatory Visit (INDEPENDENT_AMBULATORY_CARE_PROVIDER_SITE_OTHER): Payer: 59 | Admitting: Physician Assistant

## 2015-02-25 ENCOUNTER — Encounter: Payer: Self-pay | Admitting: Physician Assistant

## 2015-02-25 VITALS — BP 117/74 | HR 80 | Temp 98.5°F | Resp 14 | Ht 63.5 in | Wt 133.2 lb

## 2015-02-25 DIAGNOSIS — R1012 Left upper quadrant pain: Secondary | ICD-10-CM | POA: Diagnosis not present

## 2015-02-25 NOTE — Progress Notes (Signed)
Pre visit review using our clinic review tool, if applicable. No additional management support is needed unless otherwise documented below in the visit note/SLS  

## 2015-02-25 NOTE — Patient Instructions (Signed)
Please resume your normal diet.  Avoid excessive vegetables due to gaseousness. Resume your probiotic and start a daily stool softener and a gentle laxative (Miralax or Milk of magnesia). Stay well hydrated.  If your blood count in elevated we will start an empiric antibiotic.  Follow-up Friday.

## 2015-02-26 DIAGNOSIS — R1012 Left upper quadrant pain: Secondary | ICD-10-CM | POA: Insufficient documentation

## 2015-02-26 LAB — CBC WITH DIFFERENTIAL/PLATELET
Basophils Absolute: 0.1 10*3/uL (ref 0.0–0.1)
Basophils Relative: 1.4 % (ref 0.0–3.0)
EOS PCT: 0.2 % (ref 0.0–5.0)
Eosinophils Absolute: 0 10*3/uL (ref 0.0–0.7)
HCT: 39.4 % (ref 36.0–46.0)
HEMOGLOBIN: 13.4 g/dL (ref 12.0–15.0)
Lymphocytes Relative: 29.4 % (ref 12.0–46.0)
Lymphs Abs: 1.7 10*3/uL (ref 0.7–4.0)
MCHC: 34.1 g/dL (ref 30.0–36.0)
MCV: 89.7 fl (ref 78.0–100.0)
MONOS PCT: 7.1 % (ref 3.0–12.0)
Monocytes Absolute: 0.4 10*3/uL (ref 0.1–1.0)
NEUTROS PCT: 61.9 % (ref 43.0–77.0)
Neutro Abs: 3.6 10*3/uL (ref 1.4–7.7)
PLATELETS: 300 10*3/uL (ref 150.0–400.0)
RBC: 4.39 Mil/uL (ref 3.87–5.11)
RDW: 14 % (ref 11.5–15.5)
WBC: 5.8 10*3/uL (ref 4.0–10.5)

## 2015-02-26 NOTE — Assessment & Plan Note (Signed)
w large stool burden noted on outside imaging records. No evidence of obstruction.  Passing stool and flatulence daily.  Will check CBC w diff.  Increase fluids.  Resume diet. Start stool softener and Miralax to facilitate BM. Resume probiotic.  Follow-up in Friday.
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
## 2015-02-28 ENCOUNTER — Telehealth: Payer: Self-pay | Admitting: Family Medicine

## 2015-02-28 NOTE — Telephone Encounter (Signed)
Relation to pt: self  Call back number: [phone]   Reason for call:  Pt canceled appointment for 03/01/15 pt wanted to inform you she is feeling 80% better and how long should she take the milk magnetism & stool softener?

## 2015-02-28 NOTE — Telephone Encounter (Signed)
Pt said thank you very much and she will keep you posted

## 2015-02-28 NOTE — Telephone Encounter (Signed)
I would tell her to stop the milk of magnesia as long as she has had good bowel output.  Stool softener every couple of days if needed for hard stool. I am glad she is feeling better.  I believe she was going on vacation this weekend -- I hope she has a wonderful and safe trip.

## 2015-03-01 ENCOUNTER — Ambulatory Visit: Payer: 59 | Admitting: Physician Assistant

## 2015-04-09 ENCOUNTER — Other Ambulatory Visit: Payer: Self-pay | Admitting: Family Medicine

## 2015-04-11 ENCOUNTER — Other Ambulatory Visit: Payer: Self-pay | Admitting: Family Medicine

## 2015-04-11 MED ORDER — ALBUTEROL SULFATE HFA 108 (90 BASE) MCG/ACT IN AERS: 2.0000 | INHALATION_SPRAY | Freq: Four times a day (QID) | RESPIRATORY_TRACT | Status: DC | PRN

## 2015-05-16 ENCOUNTER — Encounter: Payer: Self-pay | Admitting: Obstetrics and Gynecology

## 2015-05-16 ENCOUNTER — Ambulatory Visit (INDEPENDENT_AMBULATORY_CARE_PROVIDER_SITE_OTHER): Payer: 59 | Admitting: Obstetrics and Gynecology

## 2015-05-16 VITALS — BP 120/70 | HR 64 | Resp 14 | Ht 64.0 in | Wt 140.4 lb

## 2015-05-16 DIAGNOSIS — Z01419 Encounter for gynecological examination (general) (routine) without abnormal findings: Secondary | ICD-10-CM | POA: Diagnosis not present

## 2015-05-16 DIAGNOSIS — Z0000 Encounter for general adult medical examination without abnormal findings: Secondary | ICD-10-CM | POA: Diagnosis not present

## 2015-05-16 LAB — POCT URINALYSIS DIPSTICK
BILIRUBIN UA: NEGATIVE
Blood, UA: NEGATIVE
Glucose, UA: NEGATIVE
KETONES UA: NEGATIVE
Leukocytes, UA: NEGATIVE
Nitrite, UA: NEGATIVE
PH UA: 5
PROTEIN UA: NEGATIVE
Urobilinogen, UA: NEGATIVE
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
## 2015-05-16 NOTE — Patient Instructions (Signed)

## 2015-05-29 ENCOUNTER — Ambulatory Visit (INDEPENDENT_AMBULATORY_CARE_PROVIDER_SITE_OTHER): Payer: 59

## 2015-05-29 DIAGNOSIS — Z23 Encounter for immunization: Secondary | ICD-10-CM | POA: Diagnosis not present

## 2015-07-06 ENCOUNTER — Other Ambulatory Visit: Payer: Self-pay | Admitting: Family Medicine

## 2015-08-26 ENCOUNTER — Telehealth: Payer: Self-pay | Admitting: Family Medicine

## 2015-08-26 DIAGNOSIS — G43809 Other migraine, not intractable, without status migrainosus: Secondary | ICD-10-CM

## 2015-08-26 MED ORDER — HYDROCODONE-ACETAMINOPHEN 5-325 MG PO TABS: 1.0000 | ORAL_TABLET | Freq: Three times a day (TID) | ORAL | Status: DC | PRN

## 2015-08-26 NOTE — Telephone Encounter (Signed)
Requesting: Hydrocodone Contract  None UD  None Last OV  05/01/2014 (PCP filled on 02/19/25 for migraines.  Patient seen by Raiford Noble on 02/25/2015) Last Refill   #30 on 02/21/15  Please Advise

## 2015-08-26 NOTE — Telephone Encounter (Signed)
Printed and on counter for signature. Did inform patient of PCP instructions regarding appt.  She did have one in December but got rescheduled due to provider and next available is in May which she is scheduled for.  She is currently on a priority list to call if openings. Patient will pickup hardcopy at the front desk.

## 2015-08-26 NOTE — Telephone Encounter (Signed)
Ok to refill Hydrocodone, same sig, same number but will need appt before any more can be dispensed

## 2015-08-26 NOTE — Telephone Encounter (Signed)
Caller name: [place]   Relationship to patient: Self  Can be reached: [phone]  Reason for call: Pt is requesting a refill on her HYDROcodone Rx.

## 2015-08-27 ENCOUNTER — Encounter: Payer: 59 | Admitting: Family Medicine

## 2015-08-29 ENCOUNTER — Encounter: Payer: 59 | Admitting: Family Medicine

## 2015-09-16 ENCOUNTER — Other Ambulatory Visit: Payer: Self-pay

## 2015-09-16 DIAGNOSIS — Z1231 Encounter for screening mammogram for malignant neoplasm of breast: Secondary | ICD-10-CM

## 2015-10-14 ENCOUNTER — Ambulatory Visit
Admission: RE | Admit: 2015-10-14 | Discharge: 2015-10-14 | Disposition: A | Discharge status: Home / Self Care | Payer: 59 | Source: Ambulatory Visit

## 2015-10-14 DIAGNOSIS — Z1231 Encounter for screening mammogram for malignant neoplasm of breast: Secondary | ICD-10-CM

## 2015-10-17 ENCOUNTER — Other Ambulatory Visit: Payer: Self-pay | Admitting: Family Medicine

## 2015-11-27 ENCOUNTER — Other Ambulatory Visit: Payer: Self-pay | Admitting: Family Medicine

## 2015-12-18 ENCOUNTER — Ambulatory Visit (INDEPENDENT_AMBULATORY_CARE_PROVIDER_SITE_OTHER): Payer: 59 | Admitting: Family Medicine

## 2015-12-18 ENCOUNTER — Encounter: Payer: Self-pay | Admitting: Family Medicine

## 2015-12-18 VITALS — BP 110/70 | HR 78 | Temp 98.5°F | Resp 16 | Ht 63.5 in | Wt 140.0 lb

## 2015-12-18 DIAGNOSIS — M25532 Pain in left wrist: Secondary | ICD-10-CM

## 2015-12-18 DIAGNOSIS — S66912A Strain of unspecified muscle, fascia and tendon at wrist and hand level, left hand, initial encounter: Secondary | ICD-10-CM | POA: Diagnosis not present

## 2015-12-18 NOTE — Patient Instructions (Signed)
Use the wrist splint as needed for the next few days- let me know if you are not better in the next few days, or if you are not back to normal in a week or so
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
## 2015-12-18 NOTE — Progress Notes (Signed)
Pre visit review using our clinic review tool, if applicable. No additional management support is needed unless otherwise documented below in the visit note. 

## 2016-01-20 ENCOUNTER — Other Ambulatory Visit: Payer: Self-pay | Admitting: Family Medicine

## 2016-01-29 ENCOUNTER — Telehealth: Payer: Self-pay | Admitting: Behavioral Health

## 2016-01-29 ENCOUNTER — Encounter: Payer: Self-pay | Admitting: Behavioral Health

## 2016-01-29 NOTE — Telephone Encounter (Signed)
Pre-Visit Call completed with patient and chart updated.   Pre-Visit Info documented in Specialty Comments under [employer].    

## 2016-01-30 ENCOUNTER — Encounter: Payer: Self-pay | Admitting: Family Medicine

## 2016-01-30 ENCOUNTER — Ambulatory Visit (INDEPENDENT_AMBULATORY_CARE_PROVIDER_SITE_OTHER): Payer: 59 | Admitting: Family Medicine

## 2016-01-30 VITALS — BP 104/72 | HR 54 | Temp 98.3°F | Ht 64.0 in | Wt 141.1 lb

## 2016-01-30 DIAGNOSIS — E162 Hypoglycemia, unspecified: Secondary | ICD-10-CM

## 2016-01-30 DIAGNOSIS — J3489 Other specified disorders of nose and nasal sinuses: Secondary | ICD-10-CM | POA: Diagnosis not present

## 2016-01-30 DIAGNOSIS — Z0001 Encounter for general adult medical examination with abnormal findings: Secondary | ICD-10-CM

## 2016-01-30 DIAGNOSIS — Z0000 Encounter for general adult medical examination without abnormal findings: Secondary | ICD-10-CM | POA: Diagnosis not present

## 2016-01-30 DIAGNOSIS — K219 Gastro-esophageal reflux disease without esophagitis: Secondary | ICD-10-CM | POA: Diagnosis not present

## 2016-01-30 DIAGNOSIS — B958 Unspecified staphylococcus as the cause of diseases classified elsewhere: Secondary | ICD-10-CM

## 2016-01-30 DIAGNOSIS — E559 Vitamin D deficiency, unspecified: Secondary | ICD-10-CM | POA: Diagnosis not present

## 2016-01-30 DIAGNOSIS — G43809 Other migraine, not intractable, without status migrainosus: Secondary | ICD-10-CM

## 2016-01-30 DIAGNOSIS — IMO0001 Reserved for inherently not codable concepts without codable children: Secondary | ICD-10-CM

## 2016-01-30 LAB — VITAMIN D 25 HYDROXY (VIT D DEFICIENCY, FRACTURES): VITD: 32.87 ng/mL (ref 30.00–100.00)

## 2016-01-30 LAB — COMPREHENSIVE METABOLIC PANEL
ALBUMIN: 4.6 g/dL (ref 3.5–5.2)
ALT: 26 U/L (ref 0–35)
AST: 20 U/L (ref 0–37)
Alkaline Phosphatase: 60 U/L (ref 39–117)
BUN: 11 mg/dL (ref 6–23)
CALCIUM: 9.8 mg/dL (ref 8.4–10.5)
CHLORIDE: 104 meq/L (ref 96–112)
CO2: 30 mEq/L (ref 19–32)
CREATININE: 0.82 mg/dL (ref 0.40–1.20)
GFR: 81.51 mL/min (ref >60.00)
Glucose, Bld: 93 mg/dL (ref 70–99)
POTASSIUM: 4.1 meq/L (ref 3.5–5.1)
Sodium: 140 mEq/L (ref 135–145)
Total Bilirubin: 0.7 mg/dL (ref 0.2–1.2)
Total Protein: 7.1 g/dL (ref 6.0–8.3)

## 2016-01-30 LAB — LIPID PANEL
CHOLESTEROL: 185 mg/dL (ref 0–200)
HDL: 75.5 mg/dL (ref >39.00)
LDL CALC: 95 mg/dL (ref 0–99)
NonHDL: 109.95
TRIGLYCERIDES: 76 mg/dL (ref 0.0–149.0)
Total CHOL/HDL Ratio: 2
VLDL: 15.2 mg/dL (ref 0.0–40.0)

## 2016-01-30 LAB — CBC
HEMATOCRIT: 39.9 % (ref 36.0–46.0)
Hemoglobin: 13.3 g/dL (ref 12.0–15.0)
MCHC: 33.3 g/dL (ref 30.0–36.0)
MCV: 89.2 fl (ref 78.0–100.0)
Platelets: 312 10*3/uL (ref 150.0–400.0)
RBC: 4.47 Mil/uL (ref 3.87–5.11)
RDW: 14.6 % (ref 11.5–15.5)
WBC: 5.2 10*3/uL (ref 4.0–10.5)

## 2016-01-30 LAB — TSH: TSH: 1.49 u[IU]/mL (ref 0.35–4.50)

## 2016-01-30 MED ORDER — VALACYCLOVIR HCL 1 G PO TABS: 1000.0000 mg | ORAL_TABLET | Freq: Two times a day (BID) | ORAL | Status: DC

## 2016-01-30 MED ORDER — HYDROCODONE-ACETAMINOPHEN 5-325 MG PO TABS: 1.0000 | ORAL_TABLET | Freq: Three times a day (TID) | ORAL | Status: DC | PRN

## 2016-01-30 MED ORDER — MONTELUKAST SODIUM 10 MG PO TABS: ORAL_TABLET | ORAL | Status: DC

## 2016-01-30 MED ORDER — MUPIROCIN 2 % EX OINT: 1.0000 "application " | TOPICAL_OINTMENT | Freq: Two times a day (BID) | CUTANEOUS | Status: DC | PRN

## 2016-01-30 MED ORDER — FLUTICASONE PROPIONATE HFA 110 MCG/ACT IN AERO: INHALATION_SPRAY | RESPIRATORY_TRACT | Status: DC

## 2016-01-30 NOTE — Assessment & Plan Note (Addendum)
Describes tingling prior to the out break in the sam place in right nares. Has had a lot of episodes of outbreaks lately consider viral component. Will give trial of Valtrex and may use Mupirocin prn.

## 2016-01-30 NOTE — Patient Instructions (Signed)
Also take daily Vitamin D over the counter.  start Vitamin D 2000 IU daily.  Calcium 1200 to 1500 mg divided tid.  Preventive Care for Adults, Female A healthy lifestyle and preventive care can promote health and wellness. Preventive health guidelines for women include the following key practices.  A routine yearly physical is a good way to check with your health care provider about your health and preventive screening. It is a chance to share any concerns and updates on your health and to receive a thorough exam.  Visit your dentist for a routine exam and preventive care every 6 months. Brush your teeth twice a day and floss once a day. Good oral hygiene prevents tooth decay and gum disease.  The frequency of eye exams is based on your age, health, family medical history, use of contact lenses, and other factors. Follow your health care provider's recommendations for frequency of eye exams.  Eat a healthy diet. Foods like vegetables, fruits, whole grains, low-fat dairy products, and lean protein foods contain the nutrients you need without too many calories. Decrease your intake of foods high in solid fats, added sugars, and salt. Eat the right amount of calories for you.Get information about a proper diet from your health care provider, if necessary.  Regular physical exercise is one of the most important things you can do for your health. Most adults should get at least 150 minutes of moderate-intensity exercise (any activity that increases your heart rate and causes you to sweat) each week. In addition, most adults need muscle-strengthening exercises on 2 or more days a week.  Maintain a healthy weight. The body mass index (BMI) is a screening tool to identify possible weight problems. It provides an estimate of body fat based on height and weight. Your health care provider can find your BMI and can help you achieve or maintain a healthy weight.For adults 20 years and older:  A BMI below 18.5  is considered underweight.  A BMI of 18.5 to 24.9 is normal.  A BMI of 25 to 29.9 is considered overweight.  A BMI of 30 and above is considered obese.  Maintain normal blood lipids and cholesterol levels by exercising and minimizing your intake of saturated fat. Eat a balanced diet with plenty of fruit and vegetables. Blood tests for lipids and cholesterol should begin at age 72 and be repeated every 5 years. If your lipid or cholesterol levels are high, you are over 50, or you are at high risk for heart disease, you may need your cholesterol levels checked more frequently.Ongoing high lipid and cholesterol levels should be treated with medicines if diet and exercise are not working.  If you smoke, find out from your health care provider how to quit. If you do not use tobacco, do not start.  Lung cancer screening is recommended for adults aged 68-80 years who are at high risk for developing lung cancer because of a history of smoking. A yearly low-dose CT scan of the lungs is recommended for people who have at least a 30-pack-year history of smoking and are a current smoker or have quit within the past 15 years. A pack year of smoking is smoking an average of 1 pack of cigarettes a day for 1 year (for example: 1 pack a day for 30 years or 2 packs a day for 15 years). Yearly screening should continue until the smoker has stopped smoking for at least 15 years. Yearly screening should be stopped for people who develop  a health problem that would prevent them from having lung cancer treatment.  If you are pregnant, do not drink alcohol. If you are breastfeeding, be very cautious about drinking alcohol. If you are not pregnant and choose to drink alcohol, do not have more than 1 drink per day. One drink is considered to be 12 ounces (355 mL) of beer, 5 ounces (148 mL) of wine, or 1.5 ounces (44 mL) of liquor.  Avoid use of street drugs. Do not share needles with anyone. Ask for help if you need support  or instructions about stopping the use of drugs.  High blood pressure causes heart disease and increases the risk of stroke. Your blood pressure should be checked at least every 1 to 2 years. Ongoing high blood pressure should be treated with medicines if weight loss and exercise do not work.  If you are 28-74 years old, ask your health care provider if you should take aspirin to prevent strokes.  Diabetes screening is done by taking a blood sample to check your blood glucose level after you have not eaten for a certain period of time (fasting). If you are not overweight and you do not have risk factors for diabetes, you should be screened once every 3 years starting at age 21. If you are overweight or obese and you are 102-28 years of age, you should be screened for diabetes every year as part of your cardiovascular risk assessment.  Breast cancer screening is essential preventive care for women. You should practice "breast self-awareness." This means understanding the normal appearance and feel of your breasts and may include breast self-examination. Any changes detected, no matter how small, should be reported to a health care provider. Women in their 57s and 30s should have a clinical breast exam (CBE) by a health care provider as part of a regular health exam every 1 to 3 years. After age 75, women should have a CBE every year. Starting at age 52, women should consider having a mammogram (breast X-ray test) every year. Women who have a family history of breast cancer should talk to their health care provider about genetic screening. Women at a high risk of breast cancer should talk to their health care providers about having an MRI and a mammogram every year.  Breast cancer gene (BRCA)-related cancer risk assessment is recommended for women who have family members with BRCA-related cancers. BRCA-related cancers include breast, ovarian, tubal, and peritoneal cancers. Having family members with these  cancers may be associated with an increased risk for harmful changes (mutations) in the breast cancer genes BRCA1 and BRCA2. Results of the assessment will determine the need for genetic counseling and BRCA1 and BRCA2 testing.  Your health care provider may recommend that you be screened regularly for cancer of the pelvic organs (ovaries, uterus, and vagina). This screening involves a pelvic examination, including checking for microscopic changes to the surface of your cervix (Pap test). You may be encouraged to have this screening done every 3 years, beginning at age 64.  For women ages 76-65, health care providers may recommend pelvic exams and Pap testing every 3 years, or they may recommend the Pap and pelvic exam, combined with testing for human papilloma virus (HPV), every 5 years. Some types of HPV increase your risk of cervical cancer. Testing for HPV may also be done on women of any age with unclear Pap test results.  Other health care providers may not recommend any screening for nonpregnant women who are considered  low risk for pelvic cancer and who do not have symptoms. Ask your health care provider if a screening pelvic exam is right for you.  If you have had past treatment for cervical cancer or a condition that could lead to cancer, you need Pap tests and screening for cancer for at least 20 years after your treatment. If Pap tests have been discontinued, your risk factors (such as having a new sexual partner) need to be reassessed to determine if screening should resume. Some women have medical problems that increase the chance of getting cervical cancer. In these cases, your health care provider may recommend more frequent screening and Pap tests.  Colorectal cancer can be detected and often prevented. Most routine colorectal cancer screening begins at the age of 88 years and continues through age 75 years. However, your health care provider may recommend screening at an earlier age if you  have risk factors for colon cancer. On a yearly basis, your health care provider may provide home test kits to check for hidden blood in the stool. Use of a small camera at the end of a tube, to directly examine the colon (sigmoidoscopy or colonoscopy), can detect the earliest forms of colorectal cancer. Talk to your health care provider about this at age 32, when routine screening begins. Direct exam of the colon should be repeated every 5-10 years through age 78 years, unless early forms of precancerous polyps or small growths are found.  People who are at an increased risk for hepatitis B should be screened for this virus. You are considered at high risk for hepatitis B if:  You were born in a country where hepatitis B occurs often. Talk with your health care provider about which countries are considered high risk.  Your parents were born in a high-risk country and you have not received a shot to protect against hepatitis B (hepatitis B vaccine).  You have HIV or AIDS.  You use needles to inject street drugs.  You live with, or have sex with, someone who has hepatitis B.  You get hemodialysis treatment.  You take certain medicines for conditions like cancer, organ transplantation, and autoimmune conditions.  Hepatitis C blood testing is recommended for all people born from [birth year] through 1965 and any individual with known risks for hepatitis C.  Practice safe sex. Use condoms and avoid high-risk sexual practices to reduce the spread of sexually transmitted infections (STIs). STIs include gonorrhea, chlamydia, syphilis, trichomonas, herpes, HPV, and human immunodeficiency virus (HIV). Herpes, HIV, and HPV are viral illnesses that have no cure. They can result in disability, cancer, and death.  You should be screened for sexually transmitted illnesses (STIs) including gonorrhea and chlamydia if:  You are sexually active and are younger than 24 years.  You are older than 24 years and your  health care provider tells you that you are at risk for this type of infection.  Your sexual activity has changed since you were last screened and you are at an increased risk for chlamydia or gonorrhea. Ask your health care provider if you are at risk.  If you are at risk of being infected with HIV, it is recommended that you take a prescription medicine daily to prevent HIV infection. This is called preexposure prophylaxis (PrEP). You are considered at risk if:  You are sexually active and do not regularly use condoms or know the HIV status of your partner(s).  You take drugs by injection.  You are sexually active with a  partner who has HIV.  Talk with your health care provider about whether you are at high risk of being infected with HIV. If you choose to begin PrEP, you should first be tested for HIV. You should then be tested every 3 months for as long as you are taking PrEP.  Osteoporosis is a disease in which the bones lose minerals and strength with aging. This can result in serious bone fractures or breaks. The risk of osteoporosis can be identified using a bone density scan. Women ages 71 years and over and women at risk for fractures or osteoporosis should discuss screening with their health care providers. Ask your health care provider whether you should take a calcium supplement or vitamin D to reduce the rate of osteoporosis.  Menopause can be associated with physical symptoms and risks. Hormone replacement therapy is available to decrease symptoms and risks. You should talk to your health care provider about whether hormone replacement therapy is right for you.  Use sunscreen. Apply sunscreen liberally and repeatedly throughout the day. You should seek shade when your shadow is shorter than you. Protect yourself by wearing long sleeves, pants, a wide-brimmed hat, and sunglasses year round, whenever you are outdoors.  Once a month, do a whole body skin exam, using a mirror to look  at the skin on your back. Tell your health care provider of new moles, moles that have irregular borders, moles that are larger than a pencil eraser, or moles that have changed in shape or color.  Stay current with required vaccines (immunizations).  Influenza vaccine. All adults should be immunized every year.  Tetanus, diphtheria, and acellular pertussis (Td, Tdap) vaccine. Pregnant women should receive 1 dose of Tdap vaccine during each pregnancy. The dose should be obtained regardless of the length of time since the last dose. Immunization is preferred during the 27th-36th week of gestation. An adult who has not previously received Tdap or who does not know her vaccine status should receive 1 dose of Tdap. This initial dose should be followed by tetanus and diphtheria toxoids (Td) booster doses every 10 years. Adults with an unknown or incomplete history of completing a 3-dose immunization series with Td-containing vaccines should begin or complete a primary immunization series including a Tdap dose. Adults should receive a Td booster every 10 years.  Varicella vaccine. An adult without evidence of immunity to varicella should receive 2 doses or a second dose if she has previously received 1 dose. Pregnant females who do not have evidence of immunity should receive the first dose after pregnancy. This first dose should be obtained before leaving the health care facility. The second dose should be obtained 4-8 weeks after the first dose.  Human papillomavirus (HPV) vaccine. Females aged 13-26 years who have not received the vaccine previously should obtain the 3-dose series. The vaccine is not recommended for use in pregnant females. However, pregnancy testing is not needed before receiving a dose. If a female is found to be pregnant after receiving a dose, no treatment is needed. In that case, the remaining doses should be delayed until after the pregnancy. Immunization is recommended for any person  with an immunocompromised condition through the age of 53 years if she did not get any or all doses earlier. During the 3-dose series, the second dose should be obtained 4-8 weeks after the first dose. The third dose should be obtained 24 weeks after the first dose and 16 weeks after the second dose.  Zoster vaccine. One  dose is recommended for adults aged 41 years or older unless certain conditions are present.  Measles, mumps, and rubella (MMR) vaccine. Adults born before [birth year] generally are considered immune to measles and mumps. Adults born in [birth year] or later should have 1 or more doses of MMR vaccine unless there is a contraindication to the vaccine or there is laboratory evidence of immunity to each of the three diseases. A routine second dose of MMR vaccine should be obtained at least 28 days after the first dose for students attending postsecondary schools, health care workers, or international travelers. People who received inactivated measles vaccine or an unknown type of measles vaccine during 1963-1967 should receive 2 doses of MMR vaccine. People who received inactivated mumps vaccine or an unknown type of mumps vaccine before 1979 and are at high risk for mumps infection should consider immunization with 2 doses of MMR vaccine. For females of childbearing age, rubella immunity should be determined. If there is no evidence of immunity, females who are not pregnant should be vaccinated. If there is no evidence of immunity, females who are pregnant should delay immunization until after pregnancy. Unvaccinated health care workers born before [birth year] who lack laboratory evidence of measles, mumps, or rubella immunity or laboratory confirmation of disease should consider measles and mumps immunization with 2 doses of MMR vaccine or rubella immunization with 1 dose of MMR vaccine.  Pneumococcal 13-valent conjugate (PCV13) vaccine. When indicated, a person who is uncertain of his immunization history and has  no record of immunization should receive the PCV13 vaccine. All adults 64 years of age and older should receive this vaccine. An adult aged 90 years or older who has certain medical conditions and has not been previously immunized should receive 1 dose of PCV13 vaccine. This PCV13 should be followed with a dose of pneumococcal polysaccharide (PPSV23) vaccine. Adults who are at high risk for pneumococcal disease should obtain the PPSV23 vaccine at least 8 weeks after the dose of PCV13 vaccine. Adults older than 41 years of age who have normal immune system function should obtain the PPSV23 vaccine dose at least 1 year after the dose of PCV13 vaccine.  Pneumococcal polysaccharide (PPSV23) vaccine. When PCV13 is also indicated, PCV13 should be obtained first. All adults aged 64 years and older should be immunized. An adult younger than age 20 years who has certain medical conditions should be immunized. Any person who resides in a nursing home or long-term care facility should be immunized. An adult smoker should be immunized. People with an immunocompromised condition and certain other conditions should receive both PCV13 and PPSV23 vaccines. People with human immunodeficiency virus (HIV) infection should be immunized as soon as possible after diagnosis. Immunization during chemotherapy or radiation therapy should be avoided. Routine use of PPSV23 vaccine is not recommended for American Indians, [place] Natives, or people younger than 65 years unless there are medical conditions that require PPSV23 vaccine. When indicated, people who have unknown immunization and have no record of immunization should receive PPSV23 vaccine. One-time revaccination 5 years after the first dose of PPSV23 is recommended for people aged 19-64 years who have chronic kidney failure, nephrotic syndrome, asplenia, or immunocompromised conditions. People who received 1-2 doses of PPSV23 before age 6 years should receive another dose of PPSV23  vaccine at age 28 years or later if at least 5 years have passed since the previous dose. Doses of PPSV23 are not needed for people immunized with PPSV23 at or after age 46 years.  Meningococcal  vaccine. Adults with asplenia or persistent complement component deficiencies should receive 2 doses of quadrivalent meningococcal conjugate (MenACWY-D) vaccine. The doses should be obtained at least 2 months apart. Microbiologists working with certain meningococcal bacteria, [employer] recruits, people at risk during an outbreak, and people who travel to or live in countries with a high rate of meningitis should be immunized. A first-year college student up through age 56 years who is living in a residence hall should receive a dose if she did not receive a dose on or after her 16th birthday. Adults who have certain high-risk conditions should receive one or more doses of vaccine.  Hepatitis A vaccine. Adults who wish to be protected from this disease, have certain high-risk conditions, work with hepatitis A-infected animals, work in hepatitis A research labs, or travel to or work in countries with a high rate of hepatitis A should be immunized. Adults who were previously unvaccinated and who anticipate close contact with an international adoptee during the first 60 days after arrival in the [place] from a country with a high rate of hepatitis A should be immunized.  Hepatitis B vaccine. Adults who wish to be protected from this disease, have certain high-risk conditions, may be exposed to blood or other infectious body fluids, are household contacts or sex partners of hepatitis B positive people, are clients or workers in certain care facilities, or travel to or work in countries with a high rate of hepatitis B should be immunized.  Haemophilus influenzae type b (Hib) vaccine. A previously unvaccinated person with asplenia or sickle cell disease or having a scheduled splenectomy should receive 1 dose of Hib  vaccine. Regardless of previous immunization, a recipient of a hematopoietic stem cell transplant should receive a 3-dose series 6-12 months after her successful transplant. Hib vaccine is not recommended for adults with HIV infection. Preventive Services / Frequency Ages 88 to 51 years  Blood pressure check.** / Every 3-5 years.  Lipid and cholesterol check.** / Every 5 years beginning at age 65.  Clinical breast exam.** / Every 3 years for women in their 62s and 31s.  BRCA-related cancer risk assessment.** / For women who have family members with a BRCA-related cancer (breast, ovarian, tubal, or peritoneal cancers).  Pap test.** / Every 2 years from ages 24 through 51. Every 3 years starting at age 15 through age 78 or 44 with a history of 3 consecutive normal Pap tests.  HPV screening.** / Every 3 years from ages 69 through ages 18 to 41 with a history of 3 consecutive normal Pap tests.  Hepatitis C blood test.** / For any individual with known risks for hepatitis C.  Skin self-exam. / Monthly.  Influenza vaccine. / Every year.  Tetanus, diphtheria, and acellular pertussis (Tdap, Td) vaccine.** / Consult your health care provider. Pregnant women should receive 1 dose of Tdap vaccine during each pregnancy. 1 dose of Td every 10 years.  Varicella vaccine.** / Consult your health care provider. Pregnant females who do not have evidence of immunity should receive the first dose after pregnancy.  HPV vaccine. / 3 doses over 6 months, if 78 and younger. The vaccine is not recommended for use in pregnant females. However, pregnancy testing is not needed before receiving a dose.  Measles, mumps, rubella (MMR) vaccine.** / You need at least 1 dose of MMR if you were born in 1957 or later. You may also need a 2nd dose. For females of childbearing age, rubella immunity should be determined. If  there is no evidence of immunity, females who are not pregnant should be vaccinated. If there is no  evidence of immunity, females who are pregnant should delay immunization until after pregnancy.  Pneumococcal 13-valent conjugate (PCV13) vaccine.** / Consult your health care provider.  Pneumococcal polysaccharide (PPSV23) vaccine.** / 1 to 2 doses if you smoke cigarettes or if you have certain conditions.  Meningococcal vaccine.** / 1 dose if you are age 71 to 22 years and a Market researcher living in a residence hall, or have one of several medical conditions, you need to get vaccinated against meningococcal disease. You may also need additional booster doses.  Hepatitis A vaccine.** / Consult your health care provider.  Hepatitis B vaccine.** / Consult your health care provider.  Haemophilus influenzae type b (Hib) vaccine.** / Consult your health care provider. Ages 41 to 99 years  Blood pressure check.** / Every year.  Lipid and cholesterol check.** / Every 5 years beginning at age 19 years.  Lung cancer screening. / Every year if you are aged 47-80 years and have a 30-pack-year history of smoking and currently smoke or have quit within the past 15 years. Yearly screening is stopped once you have quit smoking for at least 15 years or develop a health problem that would prevent you from having lung cancer treatment.  Clinical breast exam.** / Every year after age 30 years.  BRCA-related cancer risk assessment.** / For women who have family members with a BRCA-related cancer (breast, ovarian, tubal, or peritoneal cancers).  Mammogram.** / Every year beginning at age 75 years and continuing for as long as you are in good health. Consult with your health care provider.  Pap test.** / Every 3 years starting at age 75 years through age 55 or 47 years with a history of 3 consecutive normal Pap tests.  HPV screening.** / Every 3 years from ages 65 years through ages 19 to 70 years with a history of 3 consecutive normal Pap tests.  Fecal occult blood test (FOBT) of stool. /  Every year beginning at age 38 years and continuing until age 24 years. You may not need to do this test if you get a colonoscopy every 10 years.  Flexible sigmoidoscopy or colonoscopy.** / Every 5 years for a flexible sigmoidoscopy or every 10 years for a colonoscopy beginning at age 26 years and continuing until age 82 years.  Hepatitis C blood test.** / For all people born from [birth year] through 1966 and any individual with known risks for hepatitis C.  Skin self-exam. / Monthly.  Influenza vaccine. / Every year.  Tetanus, diphtheria, and acellular pertussis (Tdap/Td) vaccine.** / Consult your health care provider. Pregnant women should receive 1 dose of Tdap vaccine during each pregnancy. 1 dose of Td every 10 years.  Varicella vaccine.** / Consult your health care provider. Pregnant females who do not have evidence of immunity should receive the first dose after pregnancy.  Zoster vaccine.** / 1 dose for adults aged 32 years or older.  Measles, mumps, rubella (MMR) vaccine.** / You need at least 1 dose of MMR if you were born in 1957 or later. You may also need a second dose. For females of childbearing age, rubella immunity should be determined. If there is no evidence of immunity, females who are not pregnant should be vaccinated. If there is no evidence of immunity, females who are pregnant should delay immunization until after pregnancy.  Pneumococcal 13-valent conjugate (PCV13) vaccine.** / Consult your health care provider.  Pneumococcal polysaccharide (PPSV23) vaccine.** / 1 to 2 doses if you smoke cigarettes or if you have certain conditions.  Meningococcal vaccine.** / Consult your health care provider.  Hepatitis A vaccine.** / Consult your health care provider.  Hepatitis B vaccine.** / Consult your health care provider.  Haemophilus influenzae type b (Hib) vaccine.** / Consult your health care provider. Ages 80 years and over  Blood pressure check.** / Every year.  Lipid  and cholesterol check.** / Every 5 years beginning at age 63 years.  Lung cancer screening. / Every year if you are aged 25-80 years and have a 30-pack-year history of smoking and currently smoke or have quit within the past 15 years. Yearly screening is stopped once you have quit smoking for at least 15 years or develop a health problem that would prevent you from having lung cancer treatment.  Clinical breast exam.** / Every year after age 71 years.  BRCA-related cancer risk assessment.** / For women who have family members with a BRCA-related cancer (breast, ovarian, tubal, or peritoneal cancers).  Mammogram.** / Every year beginning at age 20 years and continuing for as long as you are in good health. Consult with your health care provider.  Pap test.** / Every 3 years starting at age 10 years through age 83 or 53 years with 3 consecutive normal Pap tests. Testing can be stopped between 65 and 70 years with 3 consecutive normal Pap tests and no abnormal Pap or HPV tests in the past 10 years.  HPV screening.** / Every 3 years from ages 69 years through ages 74 or 95 years with a history of 3 consecutive normal Pap tests. Testing can be stopped between 65 and 70 years with 3 consecutive normal Pap tests and no abnormal Pap or HPV tests in the past 10 years.  Fecal occult blood test (FOBT) of stool. / Every year beginning at age 16 years and continuing until age 43 years. You may not need to do this test if you get a colonoscopy every 10 years.  Flexible sigmoidoscopy or colonoscopy.** / Every 5 years for a flexible sigmoidoscopy or every 10 years for a colonoscopy beginning at age 37 years and continuing until age 74 years.  Hepatitis C blood test.** / For all people born from [birth year] through 1966 and any individual with known risks for hepatitis C.  Osteoporosis screening.** / A one-time screening for women ages 57 years and over and women at risk for fractures or osteoporosis.  Skin  self-exam. / Monthly.  Influenza vaccine. / Every year.  Tetanus, diphtheria, and acellular pertussis (Tdap/Td) vaccine.** / 1 dose of Td every 10 years.  Varicella vaccine.** / Consult your health care provider.  Zoster vaccine.** / 1 dose for adults aged 81 years or older.  Pneumococcal 13-valent conjugate (PCV13) vaccine.** / Consult your health care provider.  Pneumococcal polysaccharide (PPSV23) vaccine.** / 1 dose for all adults aged 67 years and older.  Meningococcal vaccine.** / Consult your health care provider.  Hepatitis A vaccine.** / Consult your health care provider.  Hepatitis B vaccine.** / Consult your health care provider.  Haemophilus influenzae type b (Hib) vaccine.** / Consult your health care provider. ** Family history and personal history of risk and conditions may change your health care provider's recommendations.   This information is not intended to replace advice given to you by your health care provider. Make sure you discuss any questions you have with your health care provider.   Document Released: 10/20/2001 Document Revised:  09/14/2014 Document Reviewed: 01/19/2011 Elsevier Interactive Patient Education [employer].

## 2016-01-30 NOTE — Progress Notes (Signed)
Pre visit review using our clinic review tool, if applicable. No additional management support is needed unless otherwise documented below in the visit note. 

## 2016-02-04 ENCOUNTER — Other Ambulatory Visit: Payer: Self-pay | Admitting: Family Medicine

## 2016-02-09 NOTE — Assessment & Plan Note (Signed)
Patient encouraged to maintain heart healthy diet, regular exercise, adequate sleep. Consider daily probiotics. Take medications as prescribed. Labs ordered and reviewed. Given and reviewed copy of ACP documents from [employer] and encouraged to complete and return
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
## 2016-04-07 ENCOUNTER — Encounter: Payer: Self-pay | Admitting: Family Medicine

## 2016-05-22 ENCOUNTER — Ambulatory Visit (INDEPENDENT_AMBULATORY_CARE_PROVIDER_SITE_OTHER): Payer: 59 | Admitting: Obstetrics and Gynecology

## 2016-05-22 ENCOUNTER — Encounter: Payer: Self-pay | Admitting: Obstetrics and Gynecology

## 2016-05-22 VITALS — BP 110/70 | HR 80 | Resp 16 | Ht 64.0 in | Wt 141.0 lb

## 2016-05-22 DIAGNOSIS — Z01419 Encounter for gynecological examination (general) (routine) without abnormal findings: Secondary | ICD-10-CM

## 2016-05-22 DIAGNOSIS — Z0000 Encounter for general adult medical examination without abnormal findings: Secondary | ICD-10-CM | POA: Diagnosis not present

## 2016-05-22 LAB — POCT URINALYSIS DIPSTICK
BILIRUBIN UA: NEGATIVE
Blood, UA: NEGATIVE
Glucose, UA: NEGATIVE
LEUKOCYTES UA: NEGATIVE
Nitrite, UA: NEGATIVE
Protein, UA: NEGATIVE
Urobilinogen, UA: NEGATIVE
pH, UA: 7

## 2016-05-22 NOTE — Patient Instructions (Signed)
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
## 2016-05-26 LAB — IPS PAP TEST WITH HPV

## 2016-06-04 ENCOUNTER — Ambulatory Visit: Payer: 59 | Admitting: Obstetrics and Gynecology

## 2016-06-16 ENCOUNTER — Ambulatory Visit (INDEPENDENT_AMBULATORY_CARE_PROVIDER_SITE_OTHER): Payer: 59

## 2016-06-16 DIAGNOSIS — Z23 Encounter for immunization: Secondary | ICD-10-CM

## 2016-06-19 ENCOUNTER — Ambulatory Visit: Payer: 59 | Admitting: Obstetrics and Gynecology

## 2016-07-07 ENCOUNTER — Other Ambulatory Visit: Payer: Self-pay | Admitting: Family Medicine

## 2016-08-11 ENCOUNTER — Encounter: Payer: Self-pay | Admitting: Family Medicine

## 2016-08-11 ENCOUNTER — Ambulatory Visit (INDEPENDENT_AMBULATORY_CARE_PROVIDER_SITE_OTHER): Payer: 59 | Admitting: Family Medicine

## 2016-08-11 VITALS — BP 114/72 | HR 60 | Temp 97.6°F | Wt 145.4 lb

## 2016-08-11 DIAGNOSIS — G43009 Migraine without aura, not intractable, without status migrainosus: Secondary | ICD-10-CM | POA: Diagnosis not present

## 2016-08-11 DIAGNOSIS — J3489 Other specified disorders of nose and nasal sinuses: Secondary | ICD-10-CM | POA: Diagnosis not present

## 2016-08-11 MED ORDER — BUTALBITAL-ACETAMINOPHEN 50-300 MG PO TABS: 1.0000 | ORAL_TABLET | Freq: Two times a day (BID) | ORAL | 0 refills | Status: DC | PRN
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
## 2016-08-11 NOTE — Assessment & Plan Note (Signed)
Has struggled with staph overtime. Derm sent her to ENT they cultured but she had only been off of her Mupirocin for a couple of daysand it showed normal nasal flora. Was advised to take Doxycycline and she did not due to photosensitization. Has been off of the Mupirocin for 4 days and she has lesions in nose that are asymptomatic. Will refer to ENT for possible biopsy due to length of years of symptoms now.

## 2016-08-11 NOTE — Assessment & Plan Note (Signed)
Was having weekly migraines while in grad school but since finishing her master's she has been resting and relaxing they are less frequent and less intense. Would like to try something other than hydrocodone due to the side effects. Will try a course of Butalbital APAP to see if that helps. Notes triggers include stress, bright sunlight and hormones

## 2016-08-11 NOTE — Patient Instructions (Addendum)
Consider Elderberry, vitamin c, zinc, Aged or black garlic for immune support Migraine Headache A migraine headache is an intense, throbbing pain on one side or both sides of the head. Migraines may also cause other symptoms, such as nausea, vomiting, and sensitivity to light and noise. What are the causes? Doing or taking certain things may also trigger migraines, such as:  Alcohol.  Smoking.  Medicines, such as:  Medicine used to treat chest pain (nitroglycerine).  Birth control pills.  Estrogen pills.  Certain blood pressure medicines.  Aged cheeses, chocolate, or caffeine.  Foods or drinks that contain nitrates, glutamate, aspartame, or tyramine.  Physical activity. Other things that may trigger a migraine include:  Menstruation.  Pregnancy.  Hunger.  Stress, lack of sleep, too much sleep, or fatigue.  Weather changes. What increases the risk? The following factors may make you more likely to experience migraine headaches:  Age. Risk increases with age.  Family history of migraine headaches.  Being Caucasian.  Depression and anxiety.  Obesity.  Being a woman.  Having a hole in the heart (patent foramen ovale) or other heart problems. What are the signs or symptoms? The main symptom of this condition is pulsating or throbbing pain. Pain may:  Happen in any area of the head, such as on one side or both sides.  Interfere with daily activities.  Get worse with physical activity.  Get worse with exposure to bright lights or loud noises. Other symptoms may include:  Nausea.  Vomiting.  Dizziness.  General sensitivity to bright lights, loud noises, or smells. Before you get a migraine, you may get warning signs that a migraine is developing (aura). An aura may include:  Seeing flashing lights or having blind spots.  Seeing bright spots, halos, or zigzag lines.  Having tunnel vision or blurred vision.  Having numbness or a tingling  feeling.  Having trouble talking.  Having muscle weakness. How is this diagnosed? A migraine headache can be diagnosed based on:  Your symptoms.  A physical exam.  Tests, such as CT scan or MRI of the head. These imaging tests can help rule out other causes of headaches.  Taking fluid from the spine (lumbar puncture) and analyzing it (cerebrospinal fluid analysis, or CSF analysis). How is this treated? A migraine headache is usually treated with medicines that:  Relieve pain.  Relieve nausea.  Prevent migraines from coming back. Treatment may also include:  Acupuncture.  Lifestyle changes like avoiding foods that trigger migraines. Follow these instructions at home: Medicines  Take over-the-counter and prescription medicines only as told by your health care provider.  Do not drive or use heavy machinery while taking prescription pain medicine.  To prevent or treat constipation while you are taking prescription pain medicine, your health care provider may recommend that you:  Drink enough fluid to keep your urine clear or pale yellow.  Take over-the-counter or prescription medicines.  Eat foods that are high in fiber, such as fresh fruits and vegetables, whole grains, and beans.  Limit foods that are high in fat and processed sugars, such as fried and sweet foods. Lifestyle  Avoid alcohol use.  Do not use any products that contain nicotine or tobacco, such as cigarettes and e-cigarettes. If you need help quitting, ask your health care provider.  Get at least 8 hours of sleep every night.  Limit your stress. General instructions  Keep a journal to find out what may trigger your migraine headaches. For example, write down:  What you  eat and drink.  How much sleep you get.  Any change to your diet or medicines.  If you have a migraine:  Avoid things that make your symptoms worse, such as bright lights.  It may help to lie down in a dark, quiet  room.  Do not drive or use heavy machinery.  Ask your health care provider what activities are safe for you while you are experiencing symptoms.  Keep all follow-up visits as told by your health care provider. This is important. Contact a health care provider if:  You develop symptoms that are different or more severe than your usual migraine symptoms. Get help right away if:  Your migraine becomes severe.  You have a fever.  You have a stiff neck.  You have vision loss.  Your muscles feel weak or like you cannot control them.  You start to lose your balance often.  You develop trouble walking.  You faint. This information is not intended to replace advice given to you by your health care provider. Make sure you discuss any questions you have with your health care provider. Document Released: 08/24/2005 Document Revised: 03/13/2016 Document Reviewed: 02/10/2016 Elsevier Interactive Patient Education  2017 [employer].

## 2016-08-11 NOTE — Progress Notes (Signed)
Pre visit review using our clinic review tool, if applicable. No additional management support is needed unless otherwise documented below in the visit note. 

## 2016-09-02 ENCOUNTER — Other Ambulatory Visit: Payer: Self-pay | Admitting: Family Medicine

## 2016-09-07 HISTORY — PX: OTHER SURGICAL HISTORY: SHX169

## 2016-09-28 ENCOUNTER — Other Ambulatory Visit: Payer: Self-pay | Admitting: Obstetrics and Gynecology

## 2016-09-28 DIAGNOSIS — Z1231 Encounter for screening mammogram for malignant neoplasm of breast: Secondary | ICD-10-CM

## 2016-10-22 ENCOUNTER — Ambulatory Visit
Admission: RE | Admit: 2016-10-22 | Discharge: 2016-10-22 | Disposition: A | Discharge status: Home / Self Care | Payer: 59 | Source: Ambulatory Visit | Attending: Obstetrics and Gynecology | Admitting: Obstetrics and Gynecology

## 2016-10-22 DIAGNOSIS — Z1231 Encounter for screening mammogram for malignant neoplasm of breast: Secondary | ICD-10-CM | POA: Diagnosis not present

## 2017-01-12 ENCOUNTER — Other Ambulatory Visit: Payer: Self-pay | Admitting: Family Medicine

## 2017-01-26 ENCOUNTER — Ambulatory Visit (INDEPENDENT_AMBULATORY_CARE_PROVIDER_SITE_OTHER): Payer: 59 | Admitting: Family Medicine

## 2017-01-26 ENCOUNTER — Encounter: Payer: Self-pay | Admitting: Family Medicine

## 2017-01-26 ENCOUNTER — Ambulatory Visit: Payer: 59 | Admitting: Medical

## 2017-01-26 VITALS — BP 118/75 | HR 59 | Temp 98.5°F | Resp 20 | Wt 141.0 lb

## 2017-01-26 DIAGNOSIS — G43809 Other migraine, not intractable, without status migrainosus: Secondary | ICD-10-CM | POA: Diagnosis not present

## 2017-01-26 DIAGNOSIS — R5383 Other fatigue: Secondary | ICD-10-CM | POA: Diagnosis not present

## 2017-01-26 DIAGNOSIS — E559 Vitamin D deficiency, unspecified: Secondary | ICD-10-CM

## 2017-01-26 LAB — CBC WITH DIFFERENTIAL/PLATELET
BASOS ABS: 0 10*3/uL (ref 0.0–0.1)
Basophils Relative: 0.9 % (ref 0.0–3.0)
EOS PCT: 1 % (ref 0.0–5.0)
Eosinophils Absolute: 0.1 10*3/uL (ref 0.0–0.7)
HCT: 39.3 % (ref 36.0–46.0)
Hemoglobin: 13.2 g/dL (ref 12.0–15.0)
LYMPHS ABS: 1.4 10*3/uL (ref 0.7–4.0)
Lymphocytes Relative: 26.1 % (ref 12.0–46.0)
MCHC: 33.7 g/dL (ref 30.0–36.0)
MCV: 90.1 fl (ref 78.0–100.0)
MONOS PCT: 6.9 % (ref 3.0–12.0)
Monocytes Absolute: 0.4 10*3/uL (ref 0.1–1.0)
NEUTROS PCT: 65.1 % (ref 43.0–77.0)
Neutro Abs: 3.5 10*3/uL (ref 1.4–7.7)
Platelets: 280 10*3/uL (ref 150.0–400.0)
RBC: 4.36 Mil/uL (ref 3.87–5.11)
RDW: 14.1 % (ref 11.5–15.5)
WBC: 5.4 10*3/uL (ref 4.0–10.5)

## 2017-01-26 LAB — COMPREHENSIVE METABOLIC PANEL
ALBUMIN: 4.7 g/dL (ref 3.5–5.2)
ALT: 18 U/L (ref 0–35)
AST: 18 U/L (ref 0–37)
Alkaline Phosphatase: 57 U/L (ref 39–117)
BUN: 10 mg/dL (ref 6–23)
CHLORIDE: 104 meq/L (ref 96–112)
CO2: 30 meq/L (ref 19–32)
Calcium: 9.6 mg/dL (ref 8.4–10.5)
Creatinine, Ser: 0.85 mg/dL (ref 0.40–1.20)
GFR: 77.83 mL/min (ref >60.00)
GLUCOSE: 90 mg/dL (ref 70–99)
Potassium: 3.9 mEq/L (ref 3.5–5.1)
SODIUM: 141 meq/L (ref 135–145)
TOTAL PROTEIN: 6.9 g/dL (ref 6.0–8.3)
Total Bilirubin: 0.5 mg/dL (ref 0.2–1.2)

## 2017-01-26 LAB — MAGNESIUM: MAGNESIUM: 2.2 mg/dL (ref 1.5–2.5)

## 2017-01-26 LAB — IRON AND TIBC
%SAT: 32 % (ref 11–50)
IRON: 106 ug/dL (ref 40–190)
TIBC: 330 ug/dL (ref 250–450)
UIBC: 224 ug/dL

## 2017-01-26 LAB — VITAMIN B12: Vitamin B-12: 429 pg/mL (ref 211–911)

## 2017-01-26 LAB — VITAMIN D 25 HYDROXY (VIT D DEFICIENCY, FRACTURES): VITD: 30.52 ng/mL (ref 30.00–100.00)

## 2017-01-26 LAB — TSH: TSH: 1.97 u[IU]/mL (ref 0.35–4.50)
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
## 2017-01-26 NOTE — Patient Instructions (Signed)
We will collect to labs for you today and call you with results.  Given the episodes of headaches worsening we may need to move forawrd with MRI.    Please help [place] help you:  We are honored you have chosen [facility] for your Primary Care home. Below you will find basic instructions that you may need to access in the future. Please help [place] help you by reading the instructions, which cover many of the frequent questions we experience.   Prescription refills and request:  -In order to allow more efficient response time, please call your pharmacy for all refills. They will forward the request electronically to [place]. This allows for the quickest possible response. Request left on a nurse line can take longer to refill, since these are checked as time allows between office patients and other phone calls.  - refill request can take up to 3-5 working days to complete.  - If request is sent electronically and request is appropiate, it is usually completed in 1-2 business days.  - all patients will need to be seen routinely for all chronic medical conditions requiring prescription medications (see follow-up below). If you are overdue for follow up on your condition, you will be asked to make an appointment and we will call in enough medication to cover you until your appointment (up to 30 days).  - all controlled substances will require a face to face visit to request/refill.  - if you desire your prescriptions to go through a new pharmacy, and have an active script at original pharmacy, you will need to call your pharmacy and have scripts transferred to new pharmacy. This is completed between the pharmacy locations and not by your provider.    Results: If any images or labs were ordered, it can take up to 1 week to get results depending on the test ordered and the lab/facility running and resulting the test. - Normal or stable results, which do not need further discussion, may be released to your  mychart immediately with attached note to you. A call may not be generated for normal results. Please make certain to sign up for mychart. If you have questions on how to activate your mychart you can call the front office.  - If your results need further discussion, our office will attempt to contact you via phone, and if unable to reach you after 2 attempts, we will release your abnormal result to your mychart with instructions.  - All results will be automatically released in mychart after 1 week.  - Your provider will provide you with explanation and instruction on all relevant material in your results. Please keep in mind, results and labs may appear confusing or abnormal to the untrained eye, but it does not mean they are actually abnormal for you personally. If you have any questions about your results that are not covered, or you desire more detailed explanation than what was provided, you should make an appointment with your provider to do so.   Our office handles many outgoing and incoming calls daily. If we have not contacted you within 1 week about your results, please check your mychart to see if there is a message first and if not, then contact our office.  In helping with this matter, you help decrease call volume, and therefore allow [place] to be able to respond to patients needs more efficiently.   Acute office visits (sick visit):  An acute visit is intended for a new problem and  are scheduled in shorter time slots to allow schedule openings for patients with new problems. This is the appropriate visit to discuss a new problem. In order to provide you with excellent quality medical care with proper time for you to explain your problem, have an exam and receive treatment with instructions, these appointments should be limited to one new problem per visit. If you experience a new problem, in which you desire to be addressed, please make an acute office visit, we save openings on the schedule to  accommodate you. Please do not save your new problem for any other type of visit, let us take care of it properly and quickly for you.   Follow up visits:  Depending on your condition(s) your provider will need to see you routinely in order to provide you with quality care and prescribe medication(s). Most chronic conditions (Example: hypertension, Diabetes, depression/anxiety... etc), require visits a couple times a year. Your provider will instruct you on proper follow up for your personal medical conditions and history. Please make certain to make follow up appointments for your condition as instructed. Failing to do so could result in lapse in your medication treatment/refills. If you request a refill, and are overdue to be seen on a condition, we will always provide you with a 30 day script (once) to allow you time to schedule.    Medicare wellness (well visit): - we have a wonderful Nurse Maudie Mercury), that will meet with you and provide you will yearly medicare wellness visits. These visits should occur yearly (can not be scheduled less than 1 calendar year apart) and cover preventive health, immunizations, advance directives and screenings you are entitled to yearly through your medicare benefits. Do not miss out on your entitled benefits, this is when medicare will pay for these benefits to be ordered for you.  These are strongly encouraged by your provider and is the appropriate type of visit to make certain you are up to date with all preventive health benefits. If you have not had your medicare wellness exam in the last 12 months, please make certain to schedule one by calling the office and schedule your medicare wellness with Maudie Mercury as soon as possible.   Yearly physical (well visit):  - Adults are recommended to be seen yearly for physicals. Check with your insurance and date of your last physical, most insurances require one calendar year between physicals. Physicals include all preventive health  topics, screenings, medical exam and labs that are appropriate for gender/age and history. You may have fasting labs needed at this visit. This is a well visit (not a sick visit), new problems should not be covered during this visit (see acute visit).  - Pediatric patients are seen more frequently when they are younger. Your provider will advise you on well child visit timing that is appropriate for your their age. - This is not a medicare wellness visit. Medicare wellness exams do not have an exam portion to the visit. Some medicare companies allow for a physical, some do not allow a yearly physical. If your medicare allows a yearly physical you can schedule the medicare wellness with our nurse Maudie Mercury and have your physical with your provider after, on the same day. Please check with insurance for your full benefits.   Late Policy/No Shows:  - all new patients should arrive 15-30 minutes earlier than appointment to allow [place] time  to  obtain all personal demographics,  insurance information and for you to complete office paperwork. -  All established patients should arrive 10-15 minutes earlier than appointment time to update all information and be checked in .  - In our best efforts to run on time, if you are late for your appointment you will be asked to either reschedule or if able, we will work you back into the schedule. There will be a wait time to work you back in the schedule,  depending on availability.  - If you are unable to make it to your appointment as scheduled, please call 24 hours ahead of time to allow [place] to fill the time slot with someone else who needs to be seen. If you do not cancel your appointment ahead of time, you may be charged a no show fee.

## 2017-01-28 ENCOUNTER — Telehealth: Payer: Self-pay | Admitting: Family Medicine

## 2017-01-28 NOTE — Telephone Encounter (Signed)
Please call pt: - I am still waiting on the lyme labs, but I am out tomorrow and given the holiday weekend, I wanted her to have her other results.  - all of her labs are normal. Her vit d is low normal. She could benefit from a mild increase in her a daily OTC supplement.  - we will call her back once other labs resulted.

## 2017-01-28 NOTE — Telephone Encounter (Signed)
Patient requesting CB. She is checking to see if the results are available & also she has some additional information for the physician.

## 2017-01-28 NOTE — Telephone Encounter (Signed)
Spoke with patient reviewed lab results let her know that Lyme results take several days to get results we will call when we get them back. She requested additional testing be done let patient know Dr Raoul Pitch states we need to get current lab test results back before we can determine if other labs are necessary. Patient states she can hardly function and wants to get better as soon as possible. Informed patient that as soon as we get test results back we will know where to go from there. Dr Raoul Pitch aware.

## 2017-01-28 NOTE — Telephone Encounter (Signed)
See  Other phone note dated today.

## 2017-01-29 ENCOUNTER — Ambulatory Visit (INDEPENDENT_AMBULATORY_CARE_PROVIDER_SITE_OTHER): Payer: 59 | Admitting: Family Medicine

## 2017-01-29 ENCOUNTER — Encounter: Payer: Self-pay | Admitting: Family Medicine

## 2017-01-29 VITALS — BP 123/83 | HR 69 | Temp 98.2°F | Resp 16 | Ht 64.0 in | Wt 142.0 lb

## 2017-01-29 DIAGNOSIS — G43109 Migraine with aura, not intractable, without status migrainosus: Secondary | ICD-10-CM | POA: Diagnosis not present

## 2017-01-29 DIAGNOSIS — M2550 Pain in unspecified joint: Secondary | ICD-10-CM

## 2017-01-29 DIAGNOSIS — F419 Anxiety disorder, unspecified: Secondary | ICD-10-CM | POA: Diagnosis not present

## 2017-01-29 DIAGNOSIS — R5383 Other fatigue: Secondary | ICD-10-CM

## 2017-01-29 LAB — LYME ABY, WSTRN BLT IGG & IGM W/BANDS
B BURGDORFERI IGG ABS (IB): NEGATIVE
B BURGDORFERI IGM ABS (IB): NEGATIVE
LYME DISEASE 28 KD IGG: NONREACTIVE
LYME DISEASE 30 KD IGG: NONREACTIVE
LYME DISEASE 39 KD IGG: NONREACTIVE
LYME DISEASE 41 KD IGM: NONREACTIVE
LYME DISEASE 45 KD IGG: NONREACTIVE
LYME DISEASE 58 KD IGG: REACTIVE — AB
LYME DISEASE 93 KD IGG: NONREACTIVE
Lyme Disease 18 kD IgG: NONREACTIVE
Lyme Disease 23 kD IgG: NONREACTIVE
Lyme Disease 23 kD IgM: NONREACTIVE
Lyme Disease 39 kD IgM: NONREACTIVE
Lyme Disease 41 kD IgG: NONREACTIVE
Lyme Disease 66 kD IgG: REACTIVE — AB

## 2017-01-29 LAB — POCT RAPID STREP A (OFFICE): RAPID STREP A SCREEN: NEGATIVE

## 2017-01-29 NOTE — Patient Instructions (Signed)
Follow up as needed/scheduled We'll notify you of your lab results and make any changes if needed We'll call you with your Neuro appt for the migraines Drink plenty of water to help prevent headaches Please consider if this could possibly be anxiety or depression if labs are all normal Call with any questions or concerns Hang in there!!!

## 2017-01-29 NOTE — Progress Notes (Signed)
   Subjective:    Patient ID: Tami Mcdonald, female    DOB: 04-Nov-1975, 41 y.o.   MRN: [id number]  HPI Fatigue- 'can't function normally', 'lying in bed a lot of the week'.  Was in [place] last week but this started prior to leaving.  Pt is asking for a mono test.  'i feel foggy in my head'.  Pt reports she is getting 12 hrs of sleep at night + sleep during the day.  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  Denies increased stress.  Pt has had mono previously.  Denies LAD.  Having intermittent joint pain in hips, ankles, knees.  No swelling.  Migraines- pt has had 'all my life' but recently has been having 'daily headaches or migraine'.  Migraine will have aura- flashing light, very intense pain.  Pt reports she had an atypical migraine 2-3 weeks ago that had facial twitching, some slurring of words, and after 20 minutes neuro sxs resolved and she developed the migraine.  Pt is not seeing neuro for this.  Does not do a daily preventative med and won't take Imitrex b/c mother had a stroke after taking med.  Migraines occurring 2-3x/week.     Review of Systems For ROS see HPI     Objective:   Physical Exam  Constitutional: She is oriented to person, place, and time. She appears well-developed and well-nourished. No distress.  HENT:  Head: Normocephalic and atraumatic.  TMs WNL No TTP over sinuses Minimal nasal congestion  Eyes: Conjunctivae and EOM are normal. Pupils are equal, round, and reactive to light.  Neck: Normal range of motion. Neck supple.  Cardiovascular: Normal rate, regular rhythm, normal heart sounds and intact distal pulses.   Pulmonary/Chest: Effort normal and breath sounds normal. No respiratory distress. She has no wheezes. She has no rales.  Lymphadenopathy:    She has no cervical adenopathy.  Neurological: She is alert and oriented to person, place, and time. She has normal reflexes. No cranial nerve deficit. Coordination normal.  Psychiatric: Her  behavior is normal. Judgment and thought content normal.  Anxious, intermittently tearful  Vitals reviewed.         Assessment & Plan:

## 2017-01-29 NOTE — Progress Notes (Signed)
Pre visit review using our clinic review tool, if applicable. No additional management support is needed unless otherwise documented below in the visit note. 

## 2017-01-30 LAB — SEDIMENTATION RATE: SED RATE: 4 mm/h (ref 0–20)

## 2017-02-02 ENCOUNTER — Telehealth: Payer: Self-pay | Admitting: Family Medicine

## 2017-02-02 ENCOUNTER — Encounter: Payer: Self-pay | Admitting: *Deleted

## 2017-02-02 LAB — EPSTEIN-BARR VIRUS VCA ANTIBODY PANEL
EBV NA IGG: 118 U/mL — AB
EBV VCA IGG: 351 U/mL — AB

## 2017-02-02 LAB — ANA: ANA: NEGATIVE

## 2017-02-02 NOTE — Assessment & Plan Note (Signed)
New.  Given pt's joint pains and excessive fatigue, will check ANA and ESR to r/o autoimmune process.  No joint abnormalities on exam.  Will follow.

## 2017-02-02 NOTE — Assessment & Plan Note (Signed)
Deteriorated.  Pt is now reporting daily HA w/ migraines occurring 2-3x/week.  She is not willing to take Triptan b/c mom had stroke while taking Imitrex.  Not willing to take beta blocker due to hx of asthma.  Discussed topamax but side effects concern pt.  Will refer to neuro for complete evaluation and management.

## 2017-02-02 NOTE — Telephone Encounter (Signed)
Sent patient message in My Chart . Patient did read message.

## 2017-02-02 NOTE — Assessment & Plan Note (Signed)
Pt has this noted in her hx but not currently being treated.  Discussed that this could be cause of her fatigue and HAs if labs remain normal and no other explanation found.  Pt to follow up w/ PCP

## 2017-02-02 NOTE — Assessment & Plan Note (Signed)
New to provider, ongoing for pt.  She was seen 3 days prior and had normal lab work.  Having joint pains associated w/ fatigue.  Check EBV and ANA to r/o mono and auto-immune process.  Discussed possibility of anxiety/depression as cause.  Pt to f/u w/ PCP

## 2017-02-02 NOTE — Telephone Encounter (Signed)
Please call patient: Her Lyme titers are negative for any acute infection or chronic infection. If she gets on her my chart, she will see there are  2 positive marks for IgG, this is not to be confused with a diagnosis of Lyme's disease. There is a criteria that has to be met for diagnoses, and the positive for just two of  the IgG is not a true diagnosis, and are commonly positive in noninfected individuals.

## 2017-02-09 ENCOUNTER — Encounter: Payer: Self-pay | Admitting: Family Medicine

## 2017-02-09 ENCOUNTER — Ambulatory Visit: Payer: 59 | Admitting: Family Medicine

## 2017-02-09 ENCOUNTER — Ambulatory Visit (INDEPENDENT_AMBULATORY_CARE_PROVIDER_SITE_OTHER): Payer: 59 | Admitting: Family Medicine

## 2017-02-09 ENCOUNTER — Encounter: Payer: 59 | Admitting: Family Medicine

## 2017-02-09 VITALS — BP 114/76 | HR 68 | Temp 98.8°F | Resp 20 | Ht 64.0 in | Wt 140.2 lb

## 2017-02-09 DIAGNOSIS — Z1159 Encounter for screening for other viral diseases: Secondary | ICD-10-CM

## 2017-02-09 DIAGNOSIS — R51 Headache: Secondary | ICD-10-CM | POA: Diagnosis not present

## 2017-02-09 DIAGNOSIS — M2550 Pain in unspecified joint: Secondary | ICD-10-CM | POA: Diagnosis not present

## 2017-02-09 DIAGNOSIS — R5383 Other fatigue: Secondary | ICD-10-CM

## 2017-02-09 DIAGNOSIS — Z114 Encounter for screening for human immunodeficiency virus [HIV]: Secondary | ICD-10-CM | POA: Diagnosis not present

## 2017-02-09 DIAGNOSIS — R519 Headache, unspecified: Secondary | ICD-10-CM

## 2017-02-09 DIAGNOSIS — Z0001 Encounter for general adult medical examination with abnormal findings: Secondary | ICD-10-CM

## 2017-02-09 LAB — C-REACTIVE PROTEIN: CRP: 0.3 mg/dL — AB (ref 0.5–20.0)
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
## 2017-02-09 NOTE — Patient Instructions (Signed)
I will call you with lab results once available.  Those results will guide [place] on further management/referrals.   We will check on neuro referral for you, and if appt to far off would want to consider ordering MRI.    I hope we can figure this out for you, if not we will get you to the right person that can help.    Health Maintenance, Female Adopting a healthy lifestyle and getting preventive care can go a long way to promote health and wellness. Talk with your health care provider about what schedule of regular examinations is right for you. This is a good chance for you to check in with your provider about disease prevention and staying healthy. In between checkups, there are plenty of things you can do on your own. Experts have done a lot of research about which lifestyle changes and preventive measures are most likely to keep you healthy. Ask your health care provider for more information. Weight and diet Eat a healthy diet  Be sure to include plenty of vegetables, fruits, low-fat dairy products, and lean protein.  Do not eat a lot of foods high in solid fats, added sugars, or salt.  Get regular exercise. This is one of the most important things you can do for your health. ? Most adults should exercise for at least 150 minutes each week. The exercise should increase your heart rate and make you sweat (moderate-intensity exercise). ? Most adults should also do strengthening exercises at least twice a week. This is in addition to the moderate-intensity exercise.  Maintain a healthy weight  Body mass index (BMI) is a measurement that can be used to identify possible weight problems. It estimates body fat based on height and weight. Your health care provider can help determine your BMI and help you achieve or maintain a healthy weight.  For females 61 years of age and older: ? A BMI below 18.5 is considered underweight. ? A BMI of 18.5 to 24.9 is normal. ? A BMI of 25 to 29.9 is  considered overweight. ? A BMI of 30 and above is considered obese.  Watch levels of cholesterol and blood lipids  You should start having your blood tested for lipids and cholesterol at 42 years of age, then have this test every 5 years.  You may need to have your cholesterol levels checked more often if: ? Your lipid or cholesterol levels are high. ? You are older than 42 years of age. ? You are at high risk for heart disease.  Cancer screening Lung Cancer  Lung cancer screening is recommended for adults 47-1 years old who are at high risk for lung cancer because of a history of smoking.  A yearly low-dose CT scan of the lungs is recommended for people who: ? Currently smoke. ? Have quit within the past 15 years. ? Have at least a 30-pack-year history of smoking. A pack year is smoking an average of one pack of cigarettes a day for 1 year.  Yearly screening should continue until it has been 15 years since you quit.  Yearly screening should stop if you develop a health problem that would prevent you from having lung cancer treatment.  Breast Cancer  Practice breast self-awareness. This means understanding how your breasts normally appear and feel.  It also means doing regular breast self-exams. Let your health care provider know about any changes, no matter how small.  If you are in your 20s or 30s, you should  have a clinical breast exam (CBE) by a health care provider every 1-3 years as part of a regular health exam.  If you are 80 or older, have a CBE every year. Also consider having a breast X-ray (mammogram) every year.  If you have a family history of breast cancer, talk to your health care provider about genetic screening.  If you are at high risk for breast cancer, talk to your health care provider about having an MRI and a mammogram every year.  Breast cancer gene (BRCA) assessment is recommended for women who have family members with BRCA-related cancers.  BRCA-related cancers include: ? Breast. ? Ovarian. ? Tubal. ? Peritoneal cancers.  Results of the assessment will determine the need for genetic counseling and BRCA1 and BRCA2 testing.  Cervical Cancer Your health care provider may recommend that you be screened regularly for cancer of the pelvic organs (ovaries, uterus, and vagina). This screening involves a pelvic examination, including checking for microscopic changes to the surface of your cervix (Pap test). You may be encouraged to have this screening done every 3 years, beginning at age 59.  For women ages 47-65, health care providers may recommend pelvic exams and Pap testing every 3 years, or they may recommend the Pap and pelvic exam, combined with testing for human papilloma virus (HPV), every 5 years. Some types of HPV increase your risk of cervical cancer. Testing for HPV may also be done on women of any age with unclear Pap test results.  Other health care providers may not recommend any screening for nonpregnant women who are considered low risk for pelvic cancer and who do not have symptoms. Ask your health care provider if a screening pelvic exam is right for you.  If you have had past treatment for cervical cancer or a condition that could lead to cancer, you need Pap tests and screening for cancer for at least 20 years after your treatment. If Pap tests have been discontinued, your risk factors (such as having a new sexual partner) need to be reassessed to determine if screening should resume. Some women have medical problems that increase the chance of getting cervical cancer. In these cases, your health care provider may recommend more frequent screening and Pap tests.  Colorectal Cancer  This type of cancer can be detected and often prevented.  Routine colorectal cancer screening usually begins at 42 years of age and continues through 42 years of age.  Your health care provider may recommend screening at an earlier age if  you have risk factors for colon cancer.  Your health care provider may also recommend using home test kits to check for hidden blood in the stool.  A small camera at the end of a tube can be used to examine your colon directly (sigmoidoscopy or colonoscopy). This is done to check for the earliest forms of colorectal cancer.  Routine screening usually begins at age 99.  Direct examination of the colon should be repeated every 5-10 years through 42 years of age. However, you may need to be screened more often if early forms of precancerous polyps or small growths are found.  Skin Cancer  Check your skin from head to toe regularly.  Tell your health care provider about any new moles or changes in moles, especially if there is a change in a mole's shape or color.  Also tell your health care provider if you have a mole that is larger than the size of a pencil eraser.  Always  use sunscreen. Apply sunscreen liberally and repeatedly throughout the day.  Protect yourself by wearing long sleeves, pants, a wide-brimmed hat, and sunglasses whenever you are outside.  Heart disease, diabetes, and high blood pressure  High blood pressure causes heart disease and increases the risk of stroke. High blood pressure is more likely to develop in: ? People who have blood pressure in the high end of the normal range (130-139/85-89 mm Hg). ? People who are overweight or obese. ? People who are African American.  If you are 18-27 years of age, have your blood pressure checked every 3-5 years. If you are 32 years of age or older, have your blood pressure checked every year. You should have your blood pressure measured twice-once when you are at a hospital or clinic, and once when you are not at a hospital or clinic. Record the average of the two measurements. To check your blood pressure when you are not at a hospital or clinic, you can use: ? An automated blood pressure machine at a pharmacy. ? A home blood  pressure monitor.  If you are between 72 years and 79 years old, ask your health care provider if you should take aspirin to prevent strokes.  Have regular diabetes screenings. This involves taking a blood sample to check your fasting blood sugar level. ? If you are at a normal weight and have a low risk for diabetes, have this test once every three years after 42 years of age. ? If you are overweight and have a high risk for diabetes, consider being tested at a younger age or more often. Preventing infection Hepatitis B  If you have a higher risk for hepatitis B, you should be screened for this virus. You are considered at high risk for hepatitis B if: ? You were born in a country where hepatitis B is common. Ask your health care provider which countries are considered high risk. ? Your parents were born in a high-risk country, and you have not been immunized against hepatitis B (hepatitis B vaccine). ? You have HIV or AIDS. ? You use needles to inject street drugs. ? You live with someone who has hepatitis B. ? You have had sex with someone who has hepatitis B. ? You get hemodialysis treatment. ? You take certain medicines for conditions, including cancer, organ transplantation, and autoimmune conditions.  Hepatitis C  Blood testing is recommended for: ? Everyone born from [birth year] through 1966. ? Anyone with known risk factors for hepatitis C.  Sexually transmitted infections (STIs)  You should be screened for sexually transmitted infections (STIs) including gonorrhea and chlamydia if: ? You are sexually active and are younger than 42 years of age. ? You are older than 42 years of age and your health care provider tells you that you are at risk for this type of infection. ? Your sexual activity has changed since you were last screened and you are at an increased risk for chlamydia or gonorrhea. Ask your health care provider if you are at risk.  If you do not have HIV, but are at risk,  it may be recommended that you take a prescription medicine daily to prevent HIV infection. This is called pre-exposure prophylaxis (PrEP). You are considered at risk if: ? You are sexually active and do not regularly use condoms or know the HIV status of your partner(s). ? You take drugs by injection. ? You are sexually active with a partner who has HIV.  Talk with your  health care provider about whether you are at high risk of being infected with HIV. If you choose to begin PrEP, you should first be tested for HIV. You should then be tested every 3 months for as long as you are taking PrEP. Pregnancy  If you are premenopausal and you may become pregnant, ask your health care provider about preconception counseling.  If you may become pregnant, take 400 to 800 micrograms (mcg) of folic acid every day.  If you want to prevent pregnancy, talk to your health care provider about birth control (contraception). Osteoporosis and menopause  Osteoporosis is a disease in which the bones lose minerals and strength with aging. This can result in serious bone fractures. Your risk for osteoporosis can be identified using a bone density scan.  If you are 41 years of age or older, or if you are at risk for osteoporosis and fractures, ask your health care provider if you should be screened.  Ask your health care provider whether you should take a calcium or vitamin D supplement to lower your risk for osteoporosis.  Menopause may have certain physical symptoms and risks.  Hormone replacement therapy may reduce some of these symptoms and risks. Talk to your health care provider about whether hormone replacement therapy is right for you. Follow these instructions at home:  Schedule regular health, dental, and eye exams.  Stay current with your immunizations.  Do not use any tobacco products including cigarettes, chewing tobacco, or electronic cigarettes.  If you are pregnant, do not drink  alcohol.  If you are breastfeeding, limit how much and how often you drink alcohol.  Limit alcohol intake to no more than 1 drink per day for nonpregnant women. One drink equals 12 ounces of beer, 5 ounces of wine, or 1 ounces of hard liquor.  Do not use street drugs.  Do not share needles.  Ask your health care provider for help if you need support or information about quitting drugs.  Tell your health care provider if you often feel depressed.  Tell your health care provider if you have ever been abused or do not feel safe at home. This information is not intended to replace advice given to you by your health care provider. Make sure you discuss any questions you have with your health care provider. Document Released: 03/09/2011 Document Revised: 01/30/2016 Document Reviewed: 05/28/2015 Elsevier Interactive Patient Education  [employer].

## 2017-02-10 LAB — HIV ANTIBODY (ROUTINE TESTING W REFLEX): HIV: NONREACTIVE

## 2017-02-10 LAB — HEPATITIS C ANTIBODY: HCV Ab: NEGATIVE

## 2017-02-10 LAB — RHEUMATOID FACTOR: Rhuematoid fact SerPl-aCnc: <14 IU/mL (ref <14)

## 2017-02-11 ENCOUNTER — Telehealth: Payer: Self-pay | Admitting: Family Medicine

## 2017-02-11 ENCOUNTER — Encounter: Payer: Self-pay | Admitting: Family Medicine

## 2017-02-11 DIAGNOSIS — R51 Headache: Secondary | ICD-10-CM

## 2017-02-11 DIAGNOSIS — R519 Headache, unspecified: Secondary | ICD-10-CM | POA: Insufficient documentation

## 2017-02-11 LAB — ANTISTREPTOLYSIN O TITER: ASO: 73 IU/mL (ref <200)

## 2017-02-11 LAB — CYCLIC CITRUL PEPTIDE ANTIBODY, IGG: Cyclic Citrullin Peptide Ab: <16 Units

## 2017-02-11 NOTE — Telephone Encounter (Signed)
Ma Hillock, DO  Dorna Bloom         Transfer is ok.  This needs to be in the record though. Staff messages are not appropriate forms of communication for any referrals, order request or transfers, they need to be apart of the patients medical record in the event it needs to be referenced.  Staff has been better at getting the transfers in the records (typically). It is their responsiblity to check before scheduling pts they have been approved by both physicians before scheduling.  Referral messages/ cancelations and order request/changes still come via message, instead of phone note, frequently.  Dr. Raliegh Ip   Previous Messages    ----- Message -----  From: Dorna Bloom I  Sent: 02/10/2017  2:04 PM  To: [name], MD, Ma Hillock, DO  Subject: FW: patient to switch to DR K            Dr. Raoul Pitch and Charlett Blake,   This patient was seen previously by Dr. Raoul Pitch and at check out she told Lattie Haw that she had been approved to change her care from [facility] to [facility]. Lattie Haw took the patients word for it since she knew that the change had to approved and scheduled her CPE with [facility].   I have talked with staff to make sure they check for a phone note or message to confirm that the change has been approved going forward. At this point is it ok to change her PCP or no?   Thanks,  Estill Bamberg    ----- Message -----  From: Ralph Dowdy, CMA  Sent: 02/09/2017  3:51 PM  To: Conception Oms  Subject: patient to switch to DR K             This is the patient that told me that she had already asked about permission to be transferring to Dr Raoul Pitch but apparently she did not.   This patient thinks she is now a patient of Dr Raoul Pitch. Please address what are my next steps.    Thank you!

## 2017-02-12 ENCOUNTER — Telehealth: Payer: Self-pay | Admitting: Family Medicine

## 2017-02-12 DIAGNOSIS — M2550 Pain in unspecified joint: Secondary | ICD-10-CM

## 2017-02-12 DIAGNOSIS — R51 Headache: Secondary | ICD-10-CM

## 2017-02-12 DIAGNOSIS — R5383 Other fatigue: Secondary | ICD-10-CM

## 2017-02-12 DIAGNOSIS — R519 Headache, unspecified: Secondary | ICD-10-CM

## 2017-02-12 LAB — CYTOMEGALOVIRUS ANTIBODY, IGG: CYTOMEGALOVIRUS AB-IGG: 9 U/mL — AB (ref <0.60)

## 2017-02-12 LAB — CMV IGM

## 2017-02-12 NOTE — Telephone Encounter (Signed)
Please call pt: - her labs are in normal range and without cause of her fatigue/arthralgia/headaches etc.  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  - She has expressed desire for further evaluation, given her symptoms. I have placed a referral for Infectious disease for her.  - please have her ask her neurologist to send OV notes here if we are to be her PCP (transferring from [place]). She has an appt with neuro coming up, maybe today.

## 2017-02-12 NOTE — Telephone Encounter (Signed)
Patient notified and verbalized understanding. 

## 2017-03-08 ENCOUNTER — Ambulatory Visit: Payer: 59 | Admitting: Diagnostic Neuroimaging

## 2017-03-12 ENCOUNTER — Other Ambulatory Visit: Payer: Self-pay | Admitting: Family Medicine

## 2017-03-12 ENCOUNTER — Telehealth: Payer: Self-pay | Admitting: Family Medicine

## 2017-03-12 DIAGNOSIS — Z203 Contact with and (suspected) exposure to rabies: Secondary | ICD-10-CM

## 2017-03-12 NOTE — Telephone Encounter (Signed)
OK to have her come in for nurse visit for Td and can run a rabies virus antibody titer with [facility] for code z20.3, possible exposure to rabies

## 2017-03-12 NOTE — Telephone Encounter (Signed)
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

## 2017-03-12 NOTE — Telephone Encounter (Signed)
Order entered Called the patient to schedule both a lab appt/nurse visit for a TD She was unable to schedule and will call back when able to do so.

## 2017-03-15 ENCOUNTER — Ambulatory Visit: Payer: 59 | Admitting: Neurology

## 2017-03-17 ENCOUNTER — Ambulatory Visit (INDEPENDENT_AMBULATORY_CARE_PROVIDER_SITE_OTHER): Payer: 59 | Admitting: Neurology

## 2017-03-17 ENCOUNTER — Telehealth: Payer: Self-pay | Admitting: Neurology

## 2017-03-17 ENCOUNTER — Encounter: Payer: Self-pay | Admitting: Neurology

## 2017-03-17 ENCOUNTER — Ambulatory Visit
Admission: RE | Admit: 2017-03-17 | Discharge: 2017-03-17 | Disposition: A | Discharge status: Home / Self Care | Payer: 59 | Source: Ambulatory Visit | Attending: Neurology | Admitting: Neurology

## 2017-03-17 VITALS — BP 127/86 | HR 67 | Ht 64.0 in | Wt 147.0 lb

## 2017-03-17 DIAGNOSIS — R4781 Slurred speech: Secondary | ICD-10-CM

## 2017-03-17 DIAGNOSIS — H905 Unspecified sensorineural hearing loss: Secondary | ICD-10-CM

## 2017-03-17 DIAGNOSIS — G43009 Migraine without aura, not intractable, without status migrainosus: Secondary | ICD-10-CM

## 2017-03-17 DIAGNOSIS — R51 Headache with orthostatic component, not elsewhere classified: Secondary | ICD-10-CM

## 2017-03-17 DIAGNOSIS — H5462 Unqualified visual loss, left eye, normal vision right eye: Secondary | ICD-10-CM | POA: Diagnosis not present

## 2017-03-17 DIAGNOSIS — R519 Headache, unspecified: Secondary | ICD-10-CM

## 2017-03-17 DIAGNOSIS — H9190 Unspecified hearing loss, unspecified ear: Secondary | ICD-10-CM

## 2017-03-17 MED ORDER — FLUOXETINE HCL 20 MG PO CAPS: 20.0000 mg | ORAL_CAPSULE | Freq: Every day | ORAL | 11 refills | Status: DC

## 2017-03-17 NOTE — Telephone Encounter (Signed)
Patient is schedule to have her MRI at [place] today 03/17/17 this afternoon. She is right now schedule her follow up with Dr. Jaynee Eagles for 04/07/17. But she is wondering if she can be seen sooner now that she is having her MRI today.

## 2017-03-17 NOTE — Telephone Encounter (Signed)
I called the patient back and scheduled her follow up for 03/24/17 at 9:30 am.
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
## 2017-03-17 NOTE — Addendum Note (Signed)
Addended by: Sarina Ill B on: 03/17/2017 09:26 AM   Modules accepted: Orders

## 2017-03-19 ENCOUNTER — Telehealth: Payer: Self-pay

## 2017-03-19 NOTE — Telephone Encounter (Signed)
-----   Message from Melvenia Beam, MD sent at 03/19/2017  8:10 AM EDT ----- MRI brain normal thanks

## 2017-03-19 NOTE — Telephone Encounter (Signed)
Called pt w/ normal MRI results. Verbalized understanding and appreciation for call.

## 2017-03-24 ENCOUNTER — Encounter: Payer: Self-pay | Admitting: Neurology

## 2017-03-24 ENCOUNTER — Other Ambulatory Visit: Payer: 59

## 2017-03-24 ENCOUNTER — Ambulatory Visit (INDEPENDENT_AMBULATORY_CARE_PROVIDER_SITE_OTHER): Payer: 59 | Admitting: Neurology

## 2017-03-24 VITALS — BP 123/82 | HR 69 | Ht 64.0 in | Wt 144.8 lb

## 2017-03-24 DIAGNOSIS — G43009 Migraine without aura, not intractable, without status migrainosus: Secondary | ICD-10-CM | POA: Diagnosis not present

## 2017-03-24 MED ORDER — BUTALBITAL-ACETAMINOPHEN 50-300 MG PO TABS: 1.0000 | ORAL_TABLET | Freq: Two times a day (BID) | ORAL | 3 refills | Status: DC | PRN

## 2017-03-24 NOTE — Patient Instructions (Signed)
Remember to drink plenty of fluid, eat healthy meals and do not skip any meals. Try to eat protein with a every meal and eat a healthy snack such as fruit or nuts in between meals. Try to keep a regular sleep-wake schedule and try to exercise daily, particularly in the form of walking, 20-30 minutes a day, if you can.   As far as your medications are concerned, I would like to suggest: Continue the Fluoxetine  I would like to see you back in 6 months, sooner if we need to. Please call us with any interim questions, concerns, problems, updates or refill requests.   Our phone number is [phone]. We also have an after hours call service for urgent matters and there is a physician on-call for urgent questions. For any emergencies you know to call 911 or go to the nearest emergency room
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
## 2017-04-07 ENCOUNTER — Ambulatory Visit: Payer: 59 | Admitting: Neurology

## 2017-04-11 ENCOUNTER — Other Ambulatory Visit: Payer: Self-pay | Admitting: Family Medicine

## 2017-04-13 NOTE — Telephone Encounter (Signed)
Patient scheduled for nurse visit and labs for 05/13/17

## 2017-05-01 DIAGNOSIS — E559 Vitamin D deficiency, unspecified: Secondary | ICD-10-CM | POA: Diagnosis not present

## 2017-05-01 DIAGNOSIS — M84374A Stress fracture, right foot, initial encounter for fracture: Secondary | ICD-10-CM | POA: Diagnosis not present

## 2017-05-06 DIAGNOSIS — H1789 Other corneal scars and opacities: Secondary | ICD-10-CM | POA: Diagnosis not present

## 2017-05-13 ENCOUNTER — Other Ambulatory Visit (INDEPENDENT_AMBULATORY_CARE_PROVIDER_SITE_OTHER): Payer: 59

## 2017-05-13 ENCOUNTER — Ambulatory Visit (INDEPENDENT_AMBULATORY_CARE_PROVIDER_SITE_OTHER): Payer: 59

## 2017-05-13 DIAGNOSIS — Z23 Encounter for immunization: Secondary | ICD-10-CM | POA: Diagnosis not present

## 2017-05-13 DIAGNOSIS — Z203 Contact with and (suspected) exposure to rabies: Secondary | ICD-10-CM | POA: Diagnosis not present

## 2017-05-13 NOTE — Progress Notes (Signed)
Pre visit review using our clinic tool,if applicable. No additional management support is needed unless otherwise documented below in the visit note.   Patient in for Tdap immunization per order from Dr. Frederik Pear dated 03/12/17.   No complaints voiced this visit. Given 0.1m IM left deltoid. Patient tolerated well.

## 2017-05-14 DIAGNOSIS — E559 Vitamin D deficiency, unspecified: Secondary | ICD-10-CM | POA: Diagnosis not present

## 2017-05-14 DIAGNOSIS — M84374A Stress fracture, right foot, initial encounter for fracture: Secondary | ICD-10-CM | POA: Diagnosis not present

## 2017-05-18 ENCOUNTER — Other Ambulatory Visit: Payer: Self-pay | Admitting: Family Medicine

## 2017-05-27 LAB — RABIES VIRUS ANTIBODY TITER: RABIES RESPONSE: 0.7 [IU]/mL
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
## 2017-06-03 ENCOUNTER — Encounter: Payer: Self-pay | Admitting: Obstetrics and Gynecology

## 2017-06-03 ENCOUNTER — Ambulatory Visit (INDEPENDENT_AMBULATORY_CARE_PROVIDER_SITE_OTHER): Payer: 59 | Admitting: Obstetrics and Gynecology

## 2017-06-03 VITALS — BP 102/60 | HR 70 | Resp 14 | Ht 63.5 in | Wt 143.0 lb

## 2017-06-03 DIAGNOSIS — Z01419 Encounter for gynecological examination (general) (routine) without abnormal findings: Secondary | ICD-10-CM

## 2017-06-03 NOTE — Patient Instructions (Signed)

## 2017-06-09 ENCOUNTER — Other Ambulatory Visit: Payer: Self-pay | Admitting: Neurology

## 2017-06-09 ENCOUNTER — Telehealth: Payer: Self-pay | Admitting: Neurology

## 2017-06-09 ENCOUNTER — Encounter: Payer: Self-pay | Admitting: Neurology

## 2017-06-09 DIAGNOSIS — G43009 Migraine without aura, not intractable, without status migrainosus: Secondary | ICD-10-CM

## 2017-06-09 DIAGNOSIS — E559 Vitamin D deficiency, unspecified: Secondary | ICD-10-CM | POA: Diagnosis not present

## 2017-06-09 DIAGNOSIS — M84374D Stress fracture, right foot, subsequent encounter for fracture with routine healing: Secondary | ICD-10-CM | POA: Diagnosis not present

## 2017-06-09 MED ORDER — FLUOXETINE HCL 40 MG PO CAPS: 40.0000 mg | ORAL_CAPSULE | Freq: Every day | ORAL | 4 refills | Status: DC

## 2017-06-09 NOTE — Telephone Encounter (Signed)
Pt call she is wanting to touch base reg FLUoxetine (PROZAC) 20 MG capsule . Pt is not having any problems. Just to update Dr Jaynee Eagles

## 2017-06-11 ENCOUNTER — Ambulatory Visit (INDEPENDENT_AMBULATORY_CARE_PROVIDER_SITE_OTHER): Payer: BLUE CROSS/BLUE SHIELD

## 2017-06-11 DIAGNOSIS — Z23 Encounter for immunization: Secondary | ICD-10-CM

## 2017-07-14 ENCOUNTER — Telehealth: Payer: Self-pay | Admitting: Obstetrics and Gynecology

## 2017-07-14 NOTE — Telephone Encounter (Signed)
Spoke with patient. Patient states that she found a mass in her left breast that is tender to the touch. Denies any redness, swelling, or warmth to the breast. Advised she will need to be seen for further evaluation. Patient is agreeable. Offered appointment tomorrow but patient declines. Declines appointment for Friday and Monday. Appointment scheduled for 07/21/2017 at 10:45 am with Dr.Silva. Patient is agreeable to date and time. Will return call to move appointment forward if symptoms worsen or would like to be seen earlier.  Routing to provider for final review. Patient agreeable to disposition. Will close encounter.

## 2017-07-14 NOTE — Telephone Encounter (Signed)
Patient thinks she has found a lump in her left breast.

## 2017-07-19 ENCOUNTER — Ambulatory Visit: Payer: Self-pay | Admitting: Obstetrics and Gynecology

## 2017-07-20 ENCOUNTER — Other Ambulatory Visit: Payer: Self-pay

## 2017-07-20 ENCOUNTER — Encounter: Payer: Self-pay | Admitting: Obstetrics and Gynecology

## 2017-07-20 ENCOUNTER — Ambulatory Visit (INDEPENDENT_AMBULATORY_CARE_PROVIDER_SITE_OTHER): Payer: BLUE CROSS/BLUE SHIELD | Admitting: Obstetrics and Gynecology

## 2017-07-20 VITALS — BP 118/70 | HR 68 | Resp 16 | Wt 137.0 lb

## 2017-07-20 DIAGNOSIS — N6320 Unspecified lump in the left breast, unspecified quadrant: Secondary | ICD-10-CM | POA: Diagnosis not present

## 2017-07-20 NOTE — Progress Notes (Signed)
Scheduled patient while in office for left breast diagnostic [facility]22[facility]2017 at 12:30 pm. Patient is agreeable to date and time. Placed in mammogram hold.
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
## 2017-07-21 ENCOUNTER — Ambulatory Visit: Payer: 59 | Admitting: Obstetrics and Gynecology

## 2017-07-22 ENCOUNTER — Ambulatory Visit
Admission: RE | Admit: 2017-07-22 | Discharge: 2017-07-22 | Disposition: A | Discharge status: Home / Self Care | Payer: BLUE CROSS/BLUE SHIELD | Source: Ambulatory Visit | Attending: Obstetrics and Gynecology | Admitting: Obstetrics and Gynecology

## 2017-07-22 DIAGNOSIS — N6320 Unspecified lump in the left breast, unspecified quadrant: Secondary | ICD-10-CM

## 2017-07-22 DIAGNOSIS — R922 Inconclusive mammogram: Secondary | ICD-10-CM | POA: Diagnosis not present

## 2017-07-22 DIAGNOSIS — N6489 Other specified disorders of breast: Secondary | ICD-10-CM | POA: Diagnosis not present

## 2017-07-26 ENCOUNTER — Telehealth: Payer: Self-pay

## 2017-07-26 NOTE — Telephone Encounter (Signed)
-----   Message from Nunzio Cobbs, MD sent at 07/22/2017  5:07 PM EST ----- Ok to remove patient from mammogram hold.  She had a normal dx mammogram and [place].  She does need a breast recheck in 6 weeks with me.  She will return to routine mammogram in Feb. 2019.

## 2017-07-26 NOTE — Telephone Encounter (Signed)
Spoke with patient. Advised of message as seen below. Patient is agreeable. Recheck scheduled on 09/09/2016 at 2:30 pm with Dr.Silva. Patient is agreeable to date and time. Encounter closed.

## 2017-07-26 NOTE — Telephone Encounter (Signed)
Left message to call [place] at [phone].  Patient removed from hold.

## 2017-08-12 ENCOUNTER — Ambulatory Visit: Payer: 59 | Admitting: Family Medicine

## 2017-08-12 ENCOUNTER — Encounter: Payer: Self-pay | Admitting: Family Medicine

## 2017-08-12 DIAGNOSIS — M2550 Pain in unspecified joint: Secondary | ICD-10-CM

## 2017-08-12 DIAGNOSIS — J45909 Unspecified asthma, uncomplicated: Secondary | ICD-10-CM

## 2017-08-12 DIAGNOSIS — F419 Anxiety disorder, unspecified: Secondary | ICD-10-CM | POA: Diagnosis not present

## 2017-08-12 DIAGNOSIS — E559 Vitamin D deficiency, unspecified: Secondary | ICD-10-CM | POA: Diagnosis not present

## 2017-08-12 DIAGNOSIS — G43009 Migraine without aura, not intractable, without status migrainosus: Secondary | ICD-10-CM

## 2017-08-12 NOTE — Patient Instructions (Signed)
Migraine Headache A migraine headache is a very strong throbbing pain on one side or both sides of your head. Migraines can also cause other symptoms. Talk with your doctor about what things may bring on (trigger) your migraine headaches. Follow these instructions at home: Medicines  Take over-the-counter and prescription medicines only as told by your doctor.  Do not drive or use heavy machinery while taking prescription pain medicine.  To prevent or treat constipation while you are taking prescription pain medicine, your doctor may recommend that you: ? Drink enough fluid to keep your pee (urine) clear or pale yellow. ? Take over-the-counter or prescription medicines. ? Eat foods that are high in fiber. These include fresh fruits and vegetables, whole grains, and beans. ? Limit foods that are high in fat and processed sugars. These include fried and sweet foods. Lifestyle  Avoid alcohol.  Do not use any products that contain nicotine or tobacco, such as cigarettes and e-cigarettes. If you need help quitting, ask your doctor.  Get at least 8 hours of sleep every night.  Limit your stress. General instructions   Keep a journal to find out what may bring on your migraines. For example, write down: ? What you eat and drink. ? How much sleep you get. ? Any change in what you eat or drink. ? Any change in your medicines.  If you have a migraine: ? Avoid things that make your symptoms worse, such as bright lights. ? It may help to lie down in a dark, quiet room. ? Do not drive or use heavy machinery. ? Ask your doctor what activities are safe for you.  Keep all follow-up visits as told by your doctor. This is important. Contact a doctor if:  You get a migraine that is different or worse than your usual migraines. Get help right away if:  Your migraine gets very bad.  You have a fever.  You have a stiff neck.  You have trouble seeing.  Your muscles feel weak or like you  cannot control them.  You start to lose your balance a lot.  You start to have trouble walking.  You pass out (faint). This information is not intended to replace advice given to you by your health care provider. Make sure you discuss any questions you have with your health care provider. Document Released: 06/02/2008 Document Revised: 03/13/2016 Document Reviewed: 02/10/2016 Elsevier Interactive Patient Education  2017 [employer].

## 2017-08-12 NOTE — Assessment & Plan Note (Addendum)
Encouraged daily supplements at 2000 IU daily

## 2017-08-12 NOTE — Assessment & Plan Note (Signed)
Is doing well on Prozac.
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
## 2017-08-12 NOTE — Assessment & Plan Note (Addendum)
Had diffuse Arthralgias in the spring when she was ill over all. Since then her right knee has flared and got swollen and painful. That started to improve but then her foot started to swell and hurt and was diagnosed with a stress fracture. With immobility it has heealed. Now her knee is acting up again. 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 Is referred to sports medicine for further evaluation and care. Try Lidocaine gel prn, ice and referred to sports med for further consideration.

## 2017-08-12 NOTE — Assessment & Plan Note (Signed)
Has been following with Dr Jaynee Eagles at [facility], was given Prozac 40 mg daily and it has helped her feel calmer and have less headaches. Is needing butalbital much less frequently.

## 2017-08-12 NOTE — Assessment & Plan Note (Signed)
Much better is now on Flovent 110 bid and is considering switching to once daily.

## 2017-08-18 ENCOUNTER — Ambulatory Visit: Payer: BLUE CROSS/BLUE SHIELD | Admitting: Family Medicine

## 2017-08-18 ENCOUNTER — Encounter: Payer: Self-pay | Admitting: Family Medicine

## 2017-08-18 ENCOUNTER — Other Ambulatory Visit: Payer: Self-pay | Admitting: Family Medicine

## 2017-08-18 ENCOUNTER — Ambulatory Visit (HOSPITAL_BASED_OUTPATIENT_CLINIC_OR_DEPARTMENT_OTHER)
Admission: RE | Admit: 2017-08-18 | Discharge: 2017-08-18 | Disposition: A | Discharge status: Home / Self Care | Payer: BLUE CROSS/BLUE SHIELD | Source: Ambulatory Visit | Attending: Family Medicine | Admitting: Family Medicine

## 2017-08-18 VITALS — BP 125/87 | HR 73 | Ht 64.0 in | Wt 135.0 lb

## 2017-08-18 DIAGNOSIS — M25561 Pain in right knee: Secondary | ICD-10-CM | POA: Insufficient documentation

## 2017-08-18 DIAGNOSIS — R937 Abnormal findings on diagnostic imaging of other parts of musculoskeletal system: Secondary | ICD-10-CM | POA: Diagnosis not present

## 2017-08-18 NOTE — Patient Instructions (Signed)
Your x-rays are reassuring. This is consistent with patellofemoral syndrome. Avoid painful activities when possible (often deep squats, lunges bother this). I would rest from zumba for 2 weeks then return but avoid squats, lunges Cross train with swimming, cycling with low resistance, elliptical if needed. Straight leg raise, hip side raises, straight leg raises with foot turned outwards, knee extensions 3 sets of 10 once a day. Add ankle weight if these become too easy. Consider formal physical therapy. Correct foot breakdown with something like dr. Zoe Lan active series, spencos. If feels more supportive with knee brace wear this during day and with exercise. Avoid flat shoes, barefoot walking as much as possible. Icing 15 minutes at a time 3-4 times a day as needed. Tylenol or ibuprofen as needed for pain. Follow up with me in 1 month to 6 weeks.
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
## 2017-08-19 ENCOUNTER — Encounter: Payer: Self-pay | Admitting: Family Medicine

## 2017-08-19 DIAGNOSIS — M25561 Pain in right knee: Secondary | ICD-10-CM | POA: Insufficient documentation

## 2017-08-19 NOTE — Assessment & Plan Note (Signed)
independently reviewed radiographs and no evidence OCD, only mild arthritis.  Consistent with patellofemoral syndrome, sprain of medial retinaculum.  Shown home exercises to do daily.  Icing, tylenol or ibuprofen if needed.  Avoid squats, lunges.  Consider formal PT, inserts.  F/u in 1 month to 6 weeks.

## 2017-08-27 ENCOUNTER — Telehealth: Payer: Self-pay

## 2017-08-27 NOTE — Telephone Encounter (Signed)
Spoke with patient to reschedule 6 week breast recheck since Dr.Silva out of office with family emergency. Offered to reschedule with another physician and urged patient to do so, but she states she would wait on Dr.Silva and she has repeat mammogram in February. She is not having any new issues. Placed on waitlist.

## 2017-09-02 ENCOUNTER — Telehealth: Payer: Self-pay

## 2017-09-02 NOTE — Telephone Encounter (Signed)
I received a prior auth request for butalbital/apap. I have completed and submitted the PA and should have a determination within 48-72 hours.  Key: [id number]

## 2017-09-05 ENCOUNTER — Encounter: Payer: Self-pay | Admitting: Neurology

## 2017-09-08 ENCOUNTER — Other Ambulatory Visit: Payer: Self-pay

## 2017-09-08 ENCOUNTER — Other Ambulatory Visit: Payer: Self-pay | Admitting: Neurology

## 2017-09-08 MED ORDER — BUTALBITAL-APAP-CAFFEINE 50-325-40 MG PO TABS: ORAL_TABLET | ORAL | 3 refills | Status: DC

## 2017-09-08 MED ORDER — FLUTICASONE PROPIONATE HFA 110 MCG/ACT IN AERO: INHALATION_SPRAY | RESPIRATORY_TRACT | 3 refills | Status: DC

## 2017-09-08 NOTE — Telephone Encounter (Signed)
Received notification by fax that Bulalbital-Acetaminophen 50-300 mg tabs has been denied due to not on the formulary. It is approved only when two alternative medications on the patient's formulary have been tried and did not work. A list of formulary drugs was not included in the notification.

## 2017-09-08 NOTE — Telephone Encounter (Signed)
Would you tell me what the formulary drugs were, there was a list?

## 2017-09-09 ENCOUNTER — Ambulatory Visit: Payer: BLUE CROSS/BLUE SHIELD | Admitting: Obstetrics and Gynecology

## 2017-09-09 MED ORDER — BUTALBITAL-ACETAMINOPHEN 50-325 MG PO TABS: ORAL_TABLET | ORAL | 3 refills | Status: DC

## 2017-09-09 NOTE — Telephone Encounter (Signed)
Called and spoke with Vaughan Basta from [place]. The patient is required to try and fail two drugs listed on her formulary. The patient has tried Fioricet. Another alternative that patient would need to try and fail first is Butalbital-Acetaminophen 50-325.

## 2017-09-09 NOTE — Addendum Note (Signed)
Addended by: Gildardo Griffes on: 09/09/2017 12:21 PM   Modules accepted: Orders

## 2017-09-09 NOTE — Telephone Encounter (Addendum)
Fioricet with caffeine prescription NOT sent to pharmacy. Ordered pt's formulary drug per Dr. Jaynee Eagles-  Butalbital-Acetaminophen 50-325 mg e-scribed to pharmacy.

## 2017-09-28 ENCOUNTER — Telehealth: Payer: Self-pay | Admitting: Obstetrics and Gynecology

## 2017-09-28 NOTE — Telephone Encounter (Signed)
Spoke with patient. Reports vaginal itching started 1 wk ago. Burning with urination, white vag d/c with odor and painful intercourse developed 1/21. Nausea for the past 2 weeks. Reports she always experiences urinary frequency as she drink a lot of water. Has noted some urine leakage at night.   LMP "1st wk of January".   Denies lower back pain, fever/chills.   OV recommended for further evaluation. Advised patient Dr. Quincy Simmonds is on LOA. Patient request to schedule with first available physician. OV scheduled for 1/23 at 8:30 am with Dr. Sabra Heck. Patient states she is scheduled for a breast recheck with Dr. Quincy Simmonds on 1/30, request to cancel this appointment and have left breast recheck on 1/23. Advised patient Dr. Sabra Heck will review, I will return call with any additional recommendations. Patient is agreeable.   Routing to provider for final review. Patient is agreeable to disposition. Will close encounter.

## 2017-09-28 NOTE — Telephone Encounter (Signed)
Patient thinks she may have an infection.

## 2017-09-29 ENCOUNTER — Other Ambulatory Visit: Payer: Self-pay

## 2017-09-29 ENCOUNTER — Ambulatory Visit (INDEPENDENT_AMBULATORY_CARE_PROVIDER_SITE_OTHER): Payer: BLUE CROSS/BLUE SHIELD | Admitting: Obstetrics & Gynecology

## 2017-09-29 ENCOUNTER — Encounter: Payer: Self-pay | Admitting: Obstetrics & Gynecology

## 2017-09-29 VITALS — BP 120/70 | HR 76 | Temp 98.7°F | Resp 16 | Wt 140.0 lb

## 2017-09-29 DIAGNOSIS — R300 Dysuria: Secondary | ICD-10-CM

## 2017-09-29 DIAGNOSIS — R5382 Chronic fatigue, unspecified: Secondary | ICD-10-CM | POA: Diagnosis not present

## 2017-09-29 DIAGNOSIS — N6320 Unspecified lump in the left breast, unspecified quadrant: Secondary | ICD-10-CM | POA: Diagnosis not present

## 2017-09-29 DIAGNOSIS — R61 Generalized hyperhidrosis: Secondary | ICD-10-CM | POA: Diagnosis not present

## 2017-09-29 DIAGNOSIS — N898 Other specified noninflammatory disorders of vagina: Secondary | ICD-10-CM

## 2017-09-29 DIAGNOSIS — R238 Other skin changes: Secondary | ICD-10-CM | POA: Diagnosis not present

## 2017-09-29 DIAGNOSIS — M2550 Pain in unspecified joint: Secondary | ICD-10-CM

## 2017-09-29 DIAGNOSIS — F5101 Primary insomnia: Secondary | ICD-10-CM | POA: Diagnosis not present

## 2017-09-29 DIAGNOSIS — R233 Spontaneous ecchymoses: Secondary | ICD-10-CM

## 2017-09-29 LAB — POCT URINALYSIS DIPSTICK
Bilirubin, UA: NEGATIVE
Blood, UA: NEGATIVE
Glucose, UA: NEGATIVE
Ketones, UA: NEGATIVE
Nitrite, UA: NEGATIVE
PH UA: 7 (ref 5.0–8.0)
PROTEIN UA: NEGATIVE
UROBILINOGEN UA: 0.2 U/dL

## 2017-09-29 MED ORDER — TINIDAZOLE 500 MG PO TABS: ORAL_TABLET | ORAL | 0 refills | Status: DC

## 2017-09-29 NOTE — Progress Notes (Signed)
Scheduled patient while in office for bilateral diagnostic mammogram [facility] at 9:30 am. Unable to do screening with ultrasound. Patient is agreeable. Placed in mammogram hold.
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
## 2017-09-30 ENCOUNTER — Ambulatory Visit: Payer: BLUE CROSS/BLUE SHIELD | Admitting: Family Medicine

## 2017-09-30 LAB — CBC
HEMATOCRIT: 40.9 % (ref 34.0–46.6)
HEMOGLOBIN: 13.3 g/dL (ref 11.1–15.9)
MCH: 29.9 pg (ref 26.6–33.0)
MCHC: 32.5 g/dL (ref 31.5–35.7)
MCV: 92 fL (ref 79–97)
Platelets: 331 10*3/uL (ref 150–379)
RBC: 4.45 x10E6/uL (ref 3.77–5.28)
RDW: 14.3 % (ref 12.3–15.4)
WBC: 5.3 10*3/uL (ref 3.4–10.8)

## 2017-09-30 LAB — COMPREHENSIVE METABOLIC PANEL
ALBUMIN: 4.7 g/dL (ref 3.5–5.5)
ALT: 23 IU/L (ref 0–32)
AST: 22 IU/L (ref 0–40)
Albumin/Globulin Ratio: 2 (ref 1.2–2.2)
Alkaline Phosphatase: 64 IU/L (ref 39–117)
BUN / CREAT RATIO: 11 (ref 9–23)
BUN: 9 mg/dL (ref 6–24)
Bilirubin Total: 0.3 mg/dL (ref 0.0–1.2)
CO2: 24 mmol/L (ref 20–29)
CREATININE: 0.83 mg/dL (ref 0.57–1.00)
Calcium: 9.5 mg/dL (ref 8.7–10.2)
Chloride: 102 mmol/L (ref 96–106)
GFR calc non Af Amer: 87 mL/min/{1.73_m2} (ref >59)
GFR, EST AFRICAN AMERICAN: 100 mL/min/{1.73_m2} (ref >59)
GLOBULIN, TOTAL: 2.3 g/dL (ref 1.5–4.5)
GLUCOSE: 95 mg/dL (ref 65–99)
Potassium: 4.5 mmol/L (ref 3.5–5.2)
SODIUM: 142 mmol/L (ref 134–144)
TOTAL PROTEIN: 7 g/dL (ref 6.0–8.5)

## 2017-09-30 LAB — BARTONELLA ANTIBODY PANEL
B henselae IgG: NEGATIVE titer
B henselae IgM: NEGATIVE titer
B quintana IgG: NEGATIVE titer

## 2017-09-30 LAB — FOLLICLE STIMULATING HORMONE: FSH: 5.3 m[IU]/mL

## 2017-09-30 LAB — VAGINITIS/VAGINOSIS, DNA PROBE
CANDIDA SPECIES: NEGATIVE
Gardnerella vaginalis: POSITIVE — AB
TRICHOMONAS VAG: NEGATIVE

## 2017-09-30 LAB — BARTONELLA ANITBODY PANEL: B QUINTANA IGM: NEGATIVE {titer}

## 2017-09-30 LAB — TSH: TSH: 2.63 u[IU]/mL (ref 0.450–4.500)

## 2017-10-01 LAB — URINE CULTURE: ORGANISM ID, BACTERIA: NO GROWTH

## 2017-10-06 ENCOUNTER — Ambulatory Visit: Payer: BLUE CROSS/BLUE SHIELD | Admitting: Obstetrics and Gynecology

## 2017-10-06 ENCOUNTER — Ambulatory Visit
Admission: RE | Admit: 2017-10-06 | Discharge: 2017-10-06 | Disposition: A | Discharge status: Home / Self Care | Payer: BLUE CROSS/BLUE SHIELD | Source: Ambulatory Visit | Attending: Obstetrics & Gynecology | Admitting: Obstetrics & Gynecology

## 2017-10-06 DIAGNOSIS — N6320 Unspecified lump in the left breast, unspecified quadrant: Secondary | ICD-10-CM

## 2017-10-06 DIAGNOSIS — N6012 Diffuse cystic mastopathy of left breast: Secondary | ICD-10-CM | POA: Diagnosis not present

## 2017-10-06 DIAGNOSIS — R922 Inconclusive mammogram: Secondary | ICD-10-CM | POA: Diagnosis not present

## 2017-10-07 ENCOUNTER — Telehealth: Payer: Self-pay | Admitting: *Deleted

## 2017-10-07 NOTE — Telephone Encounter (Signed)
-----   Message from Megan Salon, MD sent at 10/07/2017  2:46 PM EST ----- Please let pt know her testing for BV was positive.  She was treated with tindamax.  Her urine culture was negative.  Her FSH was normal.  Everything else was negative--CBC, TSH, CMP, and bartonella antibody testing.  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  I think, for now, we need to monitor and see what persists and what changes to determine if and when additional testing is appropriate.  Thanks.

## 2017-10-07 NOTE — Telephone Encounter (Signed)
Dr. Sabra Heck -please review and advise?    Notes recorded by Burnice Logan, RN on 10/07/2017 at 4:06 PM EST Spoke with patient, advised as seen below per Dr. Sabra Heck. Requesting to update Dr. Sabra Heck. Patient states she felt better initially after tindamax, symptoms of vaginal odor and discomfort returned. No vaginal d/c. Experienced spotting after OV on 1/23 and menses started on 1/28. States she also experienced spotting prior to menses last month. Vasectomy for contraceptive. Attempted intercourse 3 days ago and was very painful. Burning with urination has resolved. Advised patient will update Dr. Sabra Heck and return call with recommendations. Patient verbalizes understanding and is agreeable. See telephone encounter dated 10/07/17 to review with provider. ------

## 2017-10-08 ENCOUNTER — Other Ambulatory Visit: Payer: Self-pay | Admitting: Obstetrics & Gynecology

## 2017-10-08 MED ORDER — METRONIDAZOLE 0.75 % VA GEL: 1.0000 | Freq: Every day | VAGINAL | 0 refills | Status: DC

## 2017-10-08 NOTE — Telephone Encounter (Signed)
Reviewed with Dr. Sabra Heck. RX for Metrogel to [facility]. If symptoms do not resolve, OV recommended.   Call returned to patient, advised per Dr. Sabra Heck. If in last days of menses, advised not to use metrogel until after cycle is over. Abstain from intercourse while using metrogel and 2 hrs after completion.  Patient verbalizes understanding and is agreeable.   Routing to provider for final review. Patient is agreeable to disposition. Will close encounter.

## 2017-10-14 ENCOUNTER — Encounter: Payer: Self-pay | Admitting: Family Medicine

## 2017-10-14 ENCOUNTER — Ambulatory Visit: Payer: BLUE CROSS/BLUE SHIELD | Admitting: Family Medicine

## 2017-10-14 VITALS — BP 124/87 | HR 74 | Ht 64.0 in | Wt 137.0 lb

## 2017-10-14 DIAGNOSIS — M25561 Pain in right knee: Secondary | ICD-10-CM | POA: Diagnosis not present

## 2017-10-14 NOTE — Patient Instructions (Signed)
Continue with the home exercises for now. Hold off on running though while we go ahead with the MRI. I will call you with the MRI results and next steps.

## 2017-10-16 ENCOUNTER — Encounter: Payer: Self-pay | Admitting: Family Medicine

## 2017-10-16 NOTE — Assessment & Plan Note (Signed)
not improving as expected with relative rest, home exercise program.  Will go ahead with MRI to assess for medial meniscus tear or OCD.  Radiographs were negative.  Icing, tylenol, ibuprofen if needed.
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
## 2017-10-22 NOTE — Addendum Note (Signed)
Addended by: Sherrie George F on: 10/22/2017 08:54 AM   Modules accepted: Orders

## 2017-10-27 ENCOUNTER — Other Ambulatory Visit: Payer: Self-pay | Admitting: Obstetrics and Gynecology

## 2017-10-27 DIAGNOSIS — Z1231 Encounter for screening mammogram for malignant neoplasm of breast: Secondary | ICD-10-CM

## 2017-11-03 DIAGNOSIS — G43909 Migraine, unspecified, not intractable, without status migrainosus: Secondary | ICD-10-CM | POA: Diagnosis not present

## 2017-11-04 ENCOUNTER — Ambulatory Visit
Admission: RE | Admit: 2017-11-04 | Discharge: 2017-11-04 | Disposition: A | Discharge status: Home / Self Care | Payer: BLUE CROSS/BLUE SHIELD | Source: Ambulatory Visit | Attending: Family Medicine | Admitting: Family Medicine

## 2017-11-04 ENCOUNTER — Encounter: Payer: Self-pay | Admitting: Neurology

## 2017-11-04 DIAGNOSIS — M25561 Pain in right knee: Secondary | ICD-10-CM

## 2017-11-04 DIAGNOSIS — S83241A Other tear of medial meniscus, current injury, right knee, initial encounter: Secondary | ICD-10-CM | POA: Diagnosis not present

## 2017-11-05 DIAGNOSIS — M25561 Pain in right knee: Secondary | ICD-10-CM | POA: Diagnosis not present

## 2017-11-05 DIAGNOSIS — M542 Cervicalgia: Secondary | ICD-10-CM | POA: Diagnosis not present

## 2017-11-05 DIAGNOSIS — M545 Low back pain: Secondary | ICD-10-CM | POA: Diagnosis not present

## 2017-11-05 DIAGNOSIS — G43909 Migraine, unspecified, not intractable, without status migrainosus: Secondary | ICD-10-CM | POA: Diagnosis not present

## 2017-11-09 ENCOUNTER — Ambulatory Visit: Payer: BLUE CROSS/BLUE SHIELD | Admitting: Neurology

## 2017-11-09 ENCOUNTER — Encounter: Payer: Self-pay | Admitting: Neurology

## 2017-11-09 VITALS — BP 110/73 | HR 78 | Ht 64.0 in | Wt 140.0 lb

## 2017-11-09 DIAGNOSIS — IMO0002 Reserved for concepts with insufficient information to code with codable children: Secondary | ICD-10-CM

## 2017-11-09 DIAGNOSIS — M25561 Pain in right knee: Secondary | ICD-10-CM | POA: Diagnosis not present

## 2017-11-09 DIAGNOSIS — G43711 Chronic migraine without aura, intractable, with status migrainosus: Secondary | ICD-10-CM | POA: Diagnosis not present

## 2017-11-09 DIAGNOSIS — G43709 Chronic migraine without aura, not intractable, without status migrainosus: Secondary | ICD-10-CM

## 2017-11-09 DIAGNOSIS — M545 Low back pain: Secondary | ICD-10-CM | POA: Diagnosis not present

## 2017-11-09 DIAGNOSIS — G43909 Migraine, unspecified, not intractable, without status migrainosus: Secondary | ICD-10-CM | POA: Diagnosis not present

## 2017-11-09 DIAGNOSIS — M542 Cervicalgia: Secondary | ICD-10-CM | POA: Diagnosis not present

## 2017-11-09 MED ORDER — FREMANEZUMAB-VFRM 225 MG/1.5ML ~~LOC~~ SOSY: 1.0000 | PREFILLED_SYRINGE | SUBCUTANEOUS | 0 refills | Status: DC

## 2017-11-09 MED ORDER — FREMANEZUMAB-VFRM 225 MG/1.5ML ~~LOC~~ SOSY: 225.0000 mg | PREFILLED_SYRINGE | SUBCUTANEOUS | 11 refills | Status: DC
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
## 2017-11-09 NOTE — Patient Instructions (Signed)
Fremanezumab: Drug information [employer] here. Copyright [id number] [employer] All rights reserved. (For additional information see "Fremanezumab: Patient drug information")  For abbreviations and symbols that may be used in [employer] (show table) Brand Names: [place]  Ajovy  Pharmacologic Category  Calcitonin Gene-Related Peptide (CGRP) Receptor Antagonist;  Monoclonal Antibody, Calcitonin Gene-Related Peptide (CGRP) Receptor Antagonist  Dosing: Adult Migraine prophylaxis: SubQ: 225 mg monthly or 675 mg every 3 months. When switching dosage options, administer the first dose of the new regimen on the next scheduled date of administration. Dosing: Renal Impairment: Adult There are no dosage adjustments provided in the manufacturer's labeling. Renal impairment is not expected to affect the pharmacokinetics of fremanezumab-vfrm. Dosing: Hepatic Impairment: Adult There are no dosage adjustments provided in the manufacturer's labeling. Hepatic impairment is not expected to affect the pharmacokinetics of fremanezumab-vfrm. Dosing: Geriatric Refer to adult dosing. Dosage Forms Excipient information presented when available (limited, particularly for generics); consult specific product labeling. Solution Prefilled Syringe, Subcutaneous [preservative free]:  Ajovy: 225 mg/1.5 mL (1.5 mL) [contains edetate disodium dihydrate, polysorbate 80] Generic Equivalent Available ([place]) No Administration SubQ: For subcutaneous use only. Keep out of direct sunlight and allow prefilled syringe to come to room temperature for 30 minutes before administration. Do not warm by using a heat source (eg, hot water, microwave). Do not shake. Administer in the abdomen, thigh, or upper arm, avoiding areas that are tender, bruised, red, or indurated. The 675 mg dose should be administered as 3 consecutive 225 mg injections. For multiple injections, use the same body site, but not the exact location of the  previous injection. Use Migraine prophylaxis: Preventive treatment of migraine in adults Adverse Reactions >10%: Local: Injection site reaction (43% to 45%) 1% to 10%: Immunologic: Antibody development (?2%; neutralizing <1%) Frequency not defined: Hypersensitivity: Hypersensitivity reaction Contraindications Serious hypersensitivity to fremanezumab-vfrm or any component of the formulation Warnings/Precautions Concerns related to adverse effects: Marland Kitchen Hypersensitivity: Hypersensitivity reactions, including rash, pruritus, drug hypersensitivity, and urticaria, have been reported. Most reactions were mild to moderate and were reported from within hours to 1 month after administration. If a hypersensitivity reaction occurs, consider discontinuing treatment and institute appropriate therapy. Disease-related concerns: . Cardiovascular disease: Patients with a history of significant cardiovascular disease, vascular ischemia, or thrombotic events, such as cerebrovascular accident, transient ischemic attacks, deep vein thrombosis, or pulmonary embolism were excluded from clinical trials; use with caution in these patients. Dosage form specific issues:  . Polysorbate 80: Some dosage forms may contain polysorbate 80 (also known as Tweens). Hypersensitivity reactions, usually a delayed reaction, have been reported following exposure to pharmaceutical products containing polysorbate 80 in certain individuals (Isaksson 2002; Lucente 2000; [place]). Thrombocytopenia, ascites, pulmonary deterioration, and renal and hepatic failure have been reported in premature neonates after receiving parenteral products containing polysorbate 80 (Alade 1986; CDC 1984). See manufacturer's labeling. Other warnings/precautions:  . Immunogenicity: Anti-fremanezumab-vfrm antibodies and neutralizing antibodies may develop. Metabolism/Transport Effects None known. Drug Interactions   (For additional information: Launch drug  interactions program)   There are no known significant interactions. Pregnancy Implications Adverse events were not observed in animal reproduction studies. Fremanezumab-vfrm is a monoclonal antibody; consider the long half-life prior to use in females who are or may become pregnant until information related to pregnancy is available (Tepper 2018). Breast-Feeding Considerations It is not known if fremanezumab-vfrm is present in breast milk. According to the manufacturer, the decision to breastfeed during therapy should consider the risk of infant exposure, the benefits of breastfeeding to the infant, and  benefits of treatment to the mother. Monitoring Parameters Number of monthly migraine days. Mechanism of Action Fremanezumab-vfrm is a human monoclonal antibody that antagonizes calcitonin gene-related peptide (CGRP) receptor function. Pharmacodynamics/Kinetics Distribution: Vd: ~6 L Metabolism: Degraded by enzymatic proteolysis into small peptides and amino acids. Half-life elimination: ~31 days Time to peak: 5 to 7 days Excretion: Clearance: 0.141 L/day Pricing: [place] Solution Prefilled Syringe (Ajovy Subcutaneous) 225 mg/1.5 mL (per mL): $460.00 Disclaimer: A representative AWP (Average Wholesale Price) price or price range is provided as reference price only. A range is provided when more than one manufacturer's AWP price is available and uses the low and high price reported by the manufacturers to determine the range. The pricing data should be used for benchmarking purposes only, and as such should not be used alone to set or adjudicate any prices for reimbursement or purchasing functions or considered to be an exact price for a single product and/or manufacturer. [employer] expressly disclaims all warranties of any kind or nature, whether express or implied, and assumes no liability with respect to accuracy of price or price range data published in its solutions. In no event shall [employer]  be liable for special, indirect, incidental, or consequential damages arising from use of price or price range data. Pricing data is updated monthly. Use of UpToDate is subject to the Subscription and License Agreement

## 2017-11-11 DIAGNOSIS — M542 Cervicalgia: Secondary | ICD-10-CM | POA: Diagnosis not present

## 2017-11-11 DIAGNOSIS — G43909 Migraine, unspecified, not intractable, without status migrainosus: Secondary | ICD-10-CM | POA: Diagnosis not present

## 2017-11-11 DIAGNOSIS — M545 Low back pain: Secondary | ICD-10-CM | POA: Diagnosis not present

## 2017-11-11 DIAGNOSIS — M25561 Pain in right knee: Secondary | ICD-10-CM | POA: Diagnosis not present

## 2017-11-16 DIAGNOSIS — M545 Low back pain: Secondary | ICD-10-CM | POA: Diagnosis not present

## 2017-11-16 DIAGNOSIS — M542 Cervicalgia: Secondary | ICD-10-CM | POA: Diagnosis not present

## 2017-11-16 DIAGNOSIS — M25561 Pain in right knee: Secondary | ICD-10-CM | POA: Diagnosis not present

## 2017-11-16 DIAGNOSIS — G43909 Migraine, unspecified, not intractable, without status migrainosus: Secondary | ICD-10-CM | POA: Diagnosis not present

## 2017-11-18 ENCOUNTER — Other Ambulatory Visit: Payer: Self-pay | Admitting: *Deleted

## 2017-11-18 ENCOUNTER — Ambulatory Visit
Admission: RE | Admit: 2017-11-18 | Discharge: 2017-11-18 | Disposition: A | Discharge status: Home / Self Care | Payer: BLUE CROSS/BLUE SHIELD | Source: Ambulatory Visit | Attending: Obstetrics and Gynecology | Admitting: Obstetrics and Gynecology

## 2017-11-18 DIAGNOSIS — Z1231 Encounter for screening mammogram for malignant neoplasm of breast: Secondary | ICD-10-CM

## 2017-11-18 MED ORDER — FLUOXETINE HCL 20 MG PO CAPS: 20.0000 mg | ORAL_CAPSULE | Freq: Every day | ORAL | 3 refills | Status: DC

## 2017-11-18 NOTE — Telephone Encounter (Signed)
Received refill request for 90 day supply of Fluoxetine 20 mg, e-scribed to pharmacy.

## 2017-11-19 DIAGNOSIS — M25561 Pain in right knee: Secondary | ICD-10-CM | POA: Diagnosis not present

## 2017-11-19 DIAGNOSIS — G43909 Migraine, unspecified, not intractable, without status migrainosus: Secondary | ICD-10-CM | POA: Diagnosis not present

## 2017-11-19 DIAGNOSIS — M542 Cervicalgia: Secondary | ICD-10-CM | POA: Diagnosis not present

## 2017-11-19 DIAGNOSIS — M545 Low back pain: Secondary | ICD-10-CM | POA: Diagnosis not present

## 2017-11-23 DIAGNOSIS — M25561 Pain in right knee: Secondary | ICD-10-CM | POA: Diagnosis not present

## 2017-11-23 DIAGNOSIS — M545 Low back pain: Secondary | ICD-10-CM | POA: Diagnosis not present

## 2017-11-23 DIAGNOSIS — G43909 Migraine, unspecified, not intractable, without status migrainosus: Secondary | ICD-10-CM | POA: Diagnosis not present

## 2017-11-23 DIAGNOSIS — M542 Cervicalgia: Secondary | ICD-10-CM | POA: Diagnosis not present

## 2017-11-24 DIAGNOSIS — S83241A Other tear of medial meniscus, current injury, right knee, initial encounter: Secondary | ICD-10-CM | POA: Diagnosis not present

## 2017-11-24 DIAGNOSIS — M545 Low back pain: Secondary | ICD-10-CM | POA: Diagnosis not present

## 2017-11-24 DIAGNOSIS — M542 Cervicalgia: Secondary | ICD-10-CM | POA: Diagnosis not present

## 2017-11-24 DIAGNOSIS — G43909 Migraine, unspecified, not intractable, without status migrainosus: Secondary | ICD-10-CM | POA: Diagnosis not present

## 2017-11-24 DIAGNOSIS — M1711 Unilateral primary osteoarthritis, right knee: Secondary | ICD-10-CM | POA: Diagnosis not present

## 2017-11-24 DIAGNOSIS — M25561 Pain in right knee: Secondary | ICD-10-CM | POA: Diagnosis not present

## 2017-12-02 DIAGNOSIS — M545 Low back pain: Secondary | ICD-10-CM | POA: Diagnosis not present

## 2017-12-02 DIAGNOSIS — G43909 Migraine, unspecified, not intractable, without status migrainosus: Secondary | ICD-10-CM | POA: Diagnosis not present

## 2017-12-02 DIAGNOSIS — M25561 Pain in right knee: Secondary | ICD-10-CM | POA: Diagnosis not present

## 2017-12-02 DIAGNOSIS — M542 Cervicalgia: Secondary | ICD-10-CM | POA: Diagnosis not present

## 2017-12-06 DIAGNOSIS — M545 Low back pain: Secondary | ICD-10-CM | POA: Diagnosis not present

## 2017-12-06 DIAGNOSIS — M542 Cervicalgia: Secondary | ICD-10-CM | POA: Diagnosis not present

## 2017-12-06 DIAGNOSIS — M25561 Pain in right knee: Secondary | ICD-10-CM | POA: Diagnosis not present

## 2017-12-06 DIAGNOSIS — G43909 Migraine, unspecified, not intractable, without status migrainosus: Secondary | ICD-10-CM | POA: Diagnosis not present

## 2017-12-06 HISTORY — PX: OTHER SURGICAL HISTORY: SHX169

## 2017-12-07 DIAGNOSIS — X58XXXA Exposure to other specified factors, initial encounter: Secondary | ICD-10-CM | POA: Diagnosis not present

## 2017-12-07 DIAGNOSIS — S83231A Complex tear of medial meniscus, current injury, right knee, initial encounter: Secondary | ICD-10-CM | POA: Diagnosis not present

## 2017-12-07 DIAGNOSIS — Y999 Unspecified external cause status: Secondary | ICD-10-CM | POA: Diagnosis not present

## 2017-12-14 DIAGNOSIS — M25561 Pain in right knee: Secondary | ICD-10-CM | POA: Diagnosis not present

## 2017-12-20 DIAGNOSIS — M25561 Pain in right knee: Secondary | ICD-10-CM | POA: Diagnosis not present

## 2017-12-27 DIAGNOSIS — M25561 Pain in right knee: Secondary | ICD-10-CM | POA: Diagnosis not present

## 2018-01-02 ENCOUNTER — Other Ambulatory Visit: Payer: Self-pay | Admitting: Neurology

## 2018-01-03 DIAGNOSIS — M25561 Pain in right knee: Secondary | ICD-10-CM | POA: Diagnosis not present

## 2018-01-03 NOTE — Telephone Encounter (Signed)
Faxed Fioricet prescription to [facility] pharmacy. Received a receipt of confirmation.

## 2018-01-10 DIAGNOSIS — M25561 Pain in right knee: Secondary | ICD-10-CM | POA: Diagnosis not present

## 2018-01-18 DIAGNOSIS — M25561 Pain in right knee: Secondary | ICD-10-CM | POA: Diagnosis not present

## 2018-01-20 ENCOUNTER — Encounter: Payer: Self-pay | Admitting: Medical

## 2018-01-20 ENCOUNTER — Ambulatory Visit: Payer: BLUE CROSS/BLUE SHIELD | Admitting: Medical

## 2018-01-20 VITALS — BP 134/83 | HR 94 | Temp 98.2°F | Resp 16 | Ht 64.0 in | Wt 144.0 lb

## 2018-01-20 DIAGNOSIS — J0100 Acute maxillary sinusitis, unspecified: Secondary | ICD-10-CM

## 2018-01-20 DIAGNOSIS — R0981 Nasal congestion: Secondary | ICD-10-CM | POA: Diagnosis not present

## 2018-01-20 DIAGNOSIS — R059 Cough, unspecified: Secondary | ICD-10-CM

## 2018-01-20 DIAGNOSIS — R05 Cough: Secondary | ICD-10-CM

## 2018-01-20 MED ORDER — FLUTICASONE PROPIONATE 50 MCG/ACT NA SUSP: 2.0000 | Freq: Every day | NASAL | 1 refills | Status: DC

## 2018-01-20 MED ORDER — HYDROCODONE-HOMATROPINE 5-1.5 MG/5ML PO SYRP: ORAL_SOLUTION | ORAL | 0 refills | Status: DC

## 2018-01-20 MED ORDER — DOXYCYCLINE HYCLATE 100 MG PO TABS: 100.0000 mg | ORAL_TABLET | Freq: Two times a day (BID) | ORAL | 0 refills | Status: DC

## 2018-01-20 NOTE — Patient Instructions (Signed)
With your recent persisting nasal congestion for 3 weeks, I do think you have had a recent allergy flare.  Also some sinusitis.  I am prescribing Flonase for nasal congestion and doxycycline antibiotic.  Rx advisement given.  For moderate to severe cough, I prescribed Hycodan.  You do report some chronic slight nasal irritation.  This might represent chronic minimal staph infection.(Possible MRSA.).  If after 10 days he still has slight irritation could extend course of antibiotic for additional 4 days.  Follow-up in 10 to 14 days or as needed.
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
## 2018-01-24 DIAGNOSIS — M25561 Pain in right knee: Secondary | ICD-10-CM | POA: Diagnosis not present

## 2018-01-28 ENCOUNTER — Telehealth: Payer: Self-pay | Admitting: Medical

## 2018-01-28 ENCOUNTER — Encounter: Payer: Self-pay | Admitting: Medical

## 2018-01-28 MED ORDER — DOXYCYCLINE HYCLATE 100 MG PO TABS: 100.0000 mg | ORAL_TABLET | Freq: Two times a day (BID) | ORAL | 0 refills | Status: DC

## 2018-01-28 NOTE — Telephone Encounter (Signed)
Addition 4 days doxy given at pt request.

## 2018-01-31 ENCOUNTER — Other Ambulatory Visit: Payer: Self-pay | Admitting: Family Medicine

## 2018-01-31 MED ORDER — DOXYCYCLINE HYCLATE 100 MG PO TABS: 100.0000 mg | ORAL_TABLET | Freq: Two times a day (BID) | ORAL | 0 refills | Status: DC

## 2018-02-11 ENCOUNTER — Other Ambulatory Visit: Payer: Self-pay | Admitting: Medical

## 2018-02-17 ENCOUNTER — Encounter: Payer: BLUE CROSS/BLUE SHIELD | Admitting: Family Medicine

## 2018-03-17 ENCOUNTER — Other Ambulatory Visit: Payer: Self-pay | Admitting: Family Medicine

## 2018-04-08 ENCOUNTER — Other Ambulatory Visit: Payer: Self-pay | Admitting: Occupational Medicine

## 2018-04-08 ENCOUNTER — Ambulatory Visit
Admission: RE | Admit: 2018-04-08 | Discharge: 2018-04-08 | Disposition: A | Discharge status: Home / Self Care | Payer: No Typology Code available for payment source | Source: Ambulatory Visit | Attending: Occupational Medicine | Admitting: Occupational Medicine

## 2018-04-08 DIAGNOSIS — R52 Pain, unspecified: Secondary | ICD-10-CM

## 2018-04-30 ENCOUNTER — Other Ambulatory Visit: Payer: Self-pay | Admitting: Neurology

## 2018-05-17 ENCOUNTER — Ambulatory Visit (INDEPENDENT_AMBULATORY_CARE_PROVIDER_SITE_OTHER): Payer: 59 | Admitting: Family Medicine

## 2018-05-17 ENCOUNTER — Other Ambulatory Visit: Payer: Self-pay | Admitting: Family Medicine

## 2018-05-17 ENCOUNTER — Encounter: Payer: Self-pay | Admitting: Family Medicine

## 2018-05-17 VITALS — BP 115/81 | HR 67 | Temp 98.4°F | Resp 16 | Ht 64.0 in | Wt 141.0 lb

## 2018-05-17 DIAGNOSIS — Z23 Encounter for immunization: Secondary | ICD-10-CM | POA: Diagnosis not present

## 2018-05-17 DIAGNOSIS — E559 Vitamin D deficiency, unspecified: Secondary | ICD-10-CM

## 2018-05-17 DIAGNOSIS — Z0001 Encounter for general adult medical examination with abnormal findings: Secondary | ICD-10-CM | POA: Diagnosis not present

## 2018-05-17 DIAGNOSIS — K219 Gastro-esophageal reflux disease without esophagitis: Secondary | ICD-10-CM

## 2018-05-17 DIAGNOSIS — M25561 Pain in right knee: Secondary | ICD-10-CM | POA: Diagnosis not present

## 2018-05-17 DIAGNOSIS — J45909 Unspecified asthma, uncomplicated: Secondary | ICD-10-CM

## 2018-05-17 DIAGNOSIS — J3489 Other specified disorders of nose and nasal sinuses: Secondary | ICD-10-CM

## 2018-05-17 LAB — COMPREHENSIVE METABOLIC PANEL
ALBUMIN: 4.4 g/dL (ref 3.5–5.2)
ALT: 16 U/L (ref 0–35)
AST: 17 U/L (ref 0–37)
Alkaline Phosphatase: 61 U/L (ref 39–117)
BILIRUBIN TOTAL: 0.4 mg/dL (ref 0.2–1.2)
BUN: 8 mg/dL (ref 6–23)
CALCIUM: 9.4 mg/dL (ref 8.4–10.5)
CHLORIDE: 103 meq/L (ref 96–112)
CO2: 30 mEq/L (ref 19–32)
Creatinine, Ser: 0.75 mg/dL (ref 0.40–1.20)
GFR: 89.37 mL/min (ref >60.00)
Glucose, Bld: 86 mg/dL (ref 70–99)
POTASSIUM: 3.9 meq/L (ref 3.5–5.1)
Sodium: 140 mEq/L (ref 135–145)
TOTAL PROTEIN: 6.8 g/dL (ref 6.0–8.3)

## 2018-05-17 LAB — CBC WITH DIFFERENTIAL/PLATELET
BASOS PCT: 1.2 % (ref 0.0–3.0)
Basophils Absolute: 0.1 10*3/uL (ref 0.0–0.1)
EOS PCT: 2.4 % (ref 0.0–5.0)
Eosinophils Absolute: 0.1 10*3/uL (ref 0.0–0.7)
HEMATOCRIT: 37 % (ref 36.0–46.0)
Hemoglobin: 12.5 g/dL (ref 12.0–15.0)
LYMPHS PCT: 28.6 % (ref 12.0–46.0)
Lymphs Abs: 1.4 10*3/uL (ref 0.7–4.0)
MCHC: 33.7 g/dL (ref 30.0–36.0)
MCV: 87.1 fl (ref 78.0–100.0)
Monocytes Absolute: 0.4 10*3/uL (ref 0.1–1.0)
Monocytes Relative: 8.5 % (ref 3.0–12.0)
Neutro Abs: 3 10*3/uL (ref 1.4–7.7)
Neutrophils Relative %: 59.3 % (ref 43.0–77.0)
Platelets: 321 10*3/uL (ref 150.0–400.0)
RBC: 4.25 Mil/uL (ref 3.87–5.11)
RDW: 15.2 % (ref 11.5–15.5)
WBC: 5 10*3/uL (ref 4.0–10.5)

## 2018-05-17 LAB — VITAMIN D 25 HYDROXY (VIT D DEFICIENCY, FRACTURES): VITD: 37.95 ng/mL (ref 30.00–100.00)

## 2018-05-17 LAB — LIPID PANEL
CHOL/HDL RATIO: 2
Cholesterol: 222 mg/dL — ABNORMAL HIGH (ref 0–200)
HDL: 93.8 mg/dL (ref >39.00)
LDL Cholesterol: 111 mg/dL — ABNORMAL HIGH (ref 0–99)
NONHDL: 127.79
Triglycerides: 86 mg/dL (ref 0.0–149.0)
VLDL: 17.2 mg/dL (ref 0.0–40.0)

## 2018-05-17 LAB — TSH: TSH: 1.76 u[IU]/mL (ref 0.35–4.50)

## 2018-05-17 MED ORDER — BUTALBITAL-ACETAMINOPHEN 50-325 MG PO TABS: ORAL_TABLET | ORAL | 3 refills | Status: DC

## 2018-05-17 NOTE — Assessment & Plan Note (Signed)
Improved greatly after surgery with Dr Sydnee Cabal at [facility] back in April to repair a medial meniscus.

## 2018-05-17 NOTE — Assessment & Plan Note (Signed)
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 Always recurs in same spot on right, responds to treatment ultimately but then recurs. Active infection appears contained presently but the bump is still present just no pain currently

## 2018-05-17 NOTE — Assessment & Plan Note (Signed)
supplement and monitor

## 2018-05-17 NOTE — Patient Instructions (Signed)
Preventive Care 18-39 Years, Female Preventive care refers to lifestyle choices and visits with your health care provider that can promote health and wellness. What does preventive care include?  A yearly physical exam. This is also called an annual well check.  Dental exams once or twice a year.  Routine eye exams. Ask your health care provider how often you should have your eyes checked.  Personal lifestyle choices, including: ? Daily care of your teeth and gums. ? Regular physical activity. ? Eating a healthy diet. ? Avoiding tobacco and drug use. ? Limiting alcohol use. ? Practicing safe sex. ? Taking vitamin and mineral supplements as recommended by your health care provider. What happens during an annual well check? The services and screenings done by your health care provider during your annual well check will depend on your age, overall health, lifestyle risk factors, and family history of disease. Counseling Your health care provider may ask you questions about your:  Alcohol use.  Tobacco use.  Drug use.  Emotional well-being.  Home and relationship well-being.  Sexual activity.  Eating habits.  Work and work Statistician.  Method of birth control.  Menstrual cycle.  Pregnancy history.  Screening You may have the following tests or measurements:  Height, weight, and BMI.  Diabetes screening. This is done by checking your blood sugar (glucose) after you have not eaten for a while (fasting).  Blood pressure.  Lipid and cholesterol levels. These may be checked every 5 years starting at age 66.  Skin check.  Hepatitis C blood test.  Hepatitis B blood test.  Sexually transmitted disease (STD) testing.  BRCA-related cancer screening. This may be done if you have a family history of breast, ovarian, tubal, or peritoneal cancers.  Pelvic exam and Pap test. This may be done every 3 years starting at age 40. Starting at age 59, this may be done every 5  years if you have a Pap test in combination with an HPV test.  Discuss your test results, treatment options, and if necessary, the need for more tests with your health care provider. Vaccines Your health care provider may recommend certain vaccines, such as:  Influenza vaccine. This is recommended every year.  Tetanus, diphtheria, and acellular pertussis (Tdap, Td) vaccine. You may need a Td booster every 10 years.  Varicella vaccine. You may need this if you have not been vaccinated.  HPV vaccine. If you are 69 or younger, you may need three doses over 6 months.  Measles, mumps, and rubella (MMR) vaccine. You may need at least one dose of MMR. You may also need a second dose.  Pneumococcal 13-valent conjugate (PCV13) vaccine. You may need this if you have certain conditions and were not previously vaccinated.  Pneumococcal polysaccharide (PPSV23) vaccine. You may need one or two doses if you smoke cigarettes or if you have certain conditions.  Meningococcal vaccine. One dose is recommended if you are age 27-21 years and a first-year college student living in a residence hall, or if you have one of several medical conditions. You may also need additional booster doses.  Hepatitis A vaccine. You may need this if you have certain conditions or if you travel or work in places where you may be exposed to hepatitis A.  Hepatitis B vaccine. You may need this if you have certain conditions or if you travel or work in places where you may be exposed to hepatitis B.  Haemophilus influenzae type b (Hib) vaccine. You may need this if  you have certain risk factors.  Talk to your health care provider about which screenings and vaccines you need and how often you need them. This information is not intended to replace advice given to you by your health care provider. Make sure you discuss any questions you have with your health care provider. Document Released: 10/20/2001 Document Revised: 05/13/2016  Document Reviewed: 06/25/2015 Elsevier Interactive Patient Education  [employer].
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
## 2018-05-17 NOTE — Assessment & Plan Note (Signed)
Patient encouraged to maintain heart healthy diet, regular exercise, adequate sleep. Consider daily probiotics. Take medications as prescribed. Labs reviewed

## 2018-05-17 NOTE — Assessment & Plan Note (Signed)
Doing better on Flovent 110 bid and Allegra. Not needing Albuterol regularly

## 2018-05-17 NOTE — Assessment & Plan Note (Signed)
Better since quitting dairy. Avoid offending foods, start probiotics. Do not eat large meals in late evening and consider raising head of bed.

## 2018-05-18 ENCOUNTER — Other Ambulatory Visit: Payer: Self-pay | Admitting: Family Medicine

## 2018-05-18 DIAGNOSIS — H1789 Other corneal scars and opacities: Secondary | ICD-10-CM | POA: Diagnosis not present

## 2018-05-18 MED ORDER — BUTALBITAL-ACETAMINOPHEN 50-325 MG PO TABS: 1.0000 | ORAL_TABLET | Freq: Two times a day (BID) | ORAL | 1 refills | Status: DC | PRN

## 2018-05-18 NOTE — Telephone Encounter (Signed)
rx was sent yesterday no directions.  Please put in sig and resend

## 2018-06-16 ENCOUNTER — Other Ambulatory Visit: Payer: Self-pay

## 2018-06-16 ENCOUNTER — Encounter: Payer: Self-pay | Admitting: Obstetrics and Gynecology

## 2018-06-16 ENCOUNTER — Ambulatory Visit (INDEPENDENT_AMBULATORY_CARE_PROVIDER_SITE_OTHER): Payer: 59 | Admitting: Obstetrics and Gynecology

## 2018-06-16 VITALS — BP 102/56 | HR 70 | Resp 16 | Ht 64.5 in | Wt 144.8 lb

## 2018-06-16 DIAGNOSIS — Z01419 Encounter for gynecological examination (general) (routine) without abnormal findings: Secondary | ICD-10-CM

## 2018-06-16 NOTE — Patient Instructions (Signed)
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
## 2018-08-23 ENCOUNTER — Encounter: Payer: Self-pay | Admitting: Neurology

## 2018-08-23 ENCOUNTER — Ambulatory Visit: Payer: 59 | Admitting: Neurology

## 2018-08-23 VITALS — BP 112/72 | HR 79 | Ht 64.0 in | Wt 149.0 lb

## 2018-08-23 DIAGNOSIS — G43709 Chronic migraine without aura, not intractable, without status migrainosus: Secondary | ICD-10-CM | POA: Diagnosis not present

## 2018-08-23 MED ORDER — ERENUMAB-AOOE 140 MG/ML ~~LOC~~ SOAJ: 140.0000 mg | SUBCUTANEOUS | 11 refills | Status: DC

## 2018-08-23 NOTE — Patient Instructions (Signed)
Follow up 3 months Continue prozac Start Aimovig (do not get pregnant on this medication)  Erenumab: Patient drug information [employer] here. Copyright [phone] [employer] All rights reserved. (For additional information see "Erenumab: Drug information") Brand Names: [place]  Aimovig;  Aimovig (140 MG Dose) [DSC]  Brand Names: [place]  Aimovig  What is this drug used for?   It is used to prevent migraine headaches.  What do I need to tell my doctor BEFORE I take this drug?   If you are allergic to this drug; any part of this drug; or any other drugs, foods, or substances. Tell your doctor about the allergy and what signs you had.   This drug may interact with other drugs or health problems.   Tell your doctor and pharmacist about all of your drugs (prescription or OTC, natural products, vitamins) and health problems. You must check to make sure that it is safe for you to take this drug with all of your drugs and health problems. Do not start, stop, or change the dose of any drug without checking with your doctor.  What are some things I need to know or do while I take this drug?   Tell all of your health care providers that you take this drug. This includes your doctors, nurses, pharmacists, and dentists.   If you have a latex allergy, talk with your doctor.   Tell your doctor if you are pregnant, plan on getting pregnant, or are breast-feeding. You will need to talk about the benefits and risks to you and the baby.  What are some side effects that I need to call my doctor about right away?   WARNING/CAUTION: Even though it may be rare, some people may have very bad and sometimes deadly side effects when taking a drug. Tell your doctor or get medical help right away if you have any of the following signs or symptoms that may be related to a very bad side effect:   Signs of an allergic reaction, like rash; hives; itching; red, swollen, blistered, or peeling skin with or  without fever; wheezing; tightness in the chest or throat; trouble breathing, swallowing, or talking; unusual hoarseness; or swelling of the mouth, face, lips, tongue, or throat.  What are some other side effects of this drug?   All drugs may cause side effects. However, many people have no side effects or only have minor side effects. Call your doctor or get medical help if any of these side effects or any other side effects bother you or do not go away:   Pain, redness, or swelling where the shot was given.   Constipation is common with this drug. In some people, severe constipation led to treatment in a hospital or surgery. Call your doctor right away if you have constipation that is severe or does not go away.   These are not all of the side effects that may occur. If you have questions about side effects, call your doctor. Call your doctor for medical advice about side effects.   You may report side effects to your national health agency.  How is this drug best taken?   Use this drug as ordered by your doctor. Read all information given to you. Follow all instructions closely.   It is given as a shot into the fatty part of the skin on the top of the thigh, belly area, or upper arm.   If you will be giving yourself the shot, your doctor  or nurse will teach you how to give the shot.   If stored in a refrigerator, let this drug come to room temperature before using it. Leave it at room temperature for at least 30 minutes. Do not heat this drug.   Protect from heat and sunlight.   Do not shake.   Do not give into skin within 2 inches of the belly button.   Do not give into skin that is irritated, tender, bruised, red, scaly, hard, scarred, or has stretch marks.   Do not use if the solution is cloudy, leaking, or has particles.   This drug is colorless to a faint yellow. Do not use if the solution changes color.   Throw away after using. Do not use the device more than 1 time.   Throw away  needles in a needle/sharp disposal box. Do not reuse needles or other items. When the box is full, follow all local rules for getting rid of it. Talk with a doctor or pharmacist if you have any questions.  What do I do if I miss a dose?   Take a missed dose as soon as you think about it.   After taking a missed dose, start a new schedule based on when the dose is taken.  How do I store and/or throw out this drug?   Store in a refrigerator. Do not freeze.   Store in the original container to protect from light.   Do not use if it has been frozen.   If you drop this drug on a hard surface, do not use it.   If needed, you may store at room temperature for up to 7 days. Write down the date you take this drug out of the refrigerator. If stored at room temperature and not used within 7 days, throw this drug away.   Do not put this drug back in the refrigerator after it has been stored at room temperature.   Keep all drugs in a safe place. Keep all drugs out of the reach of children and pets.   Throw away unused or expired drugs. Do not flush down a toilet or pour down a drain unless you are told to do so. Check with your pharmacist if you have questions about the best way to throw out drugs. There may be drug take-back programs in your area.  General drug facts   If your symptoms or health problems do not get better or if they become worse, call your doctor.   Do not share your drugs with others and do not take anyone else's drugs.   Some drugs may have another patient information leaflet. If you have any questions about this drug, please talk with your doctor, nurse, pharmacist, or other health care provider.   If you think there has been an overdose, call your poison control center or get medical care right away. Be ready to tell or show what was taken, how much, and when it happened.  Use of [employer] is subject to the Subscription and License Agreement. Topic [id number] Version 11.0
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
## 2018-09-09 IMAGING — MR MR KNEE*R* W/O CM
4 of 7 series · 19 of 40 positions shown · non-contrast
Comparison: Plain films right knee 08/07/2017.

CLINICAL DATA: The patient felt a pop in the anterior aspect of the
right knee 2 weeks ago squatting. Swelling and pain began a few days
later.

EXAM:
MRI OF THE RIGHT KNEE WITHOUT CONTRAST
TECHNIQUE: Multiplanar, multisequence MR imaging of the knee was performed. No
intravenous contrast was administered.

[Series 3: PD fat-sat · axial · 4.0mm · 0.31mm/px · z∈[-32,+82]mm · 6 of 27 slices shown (1 of 4)]
[im 1/27]
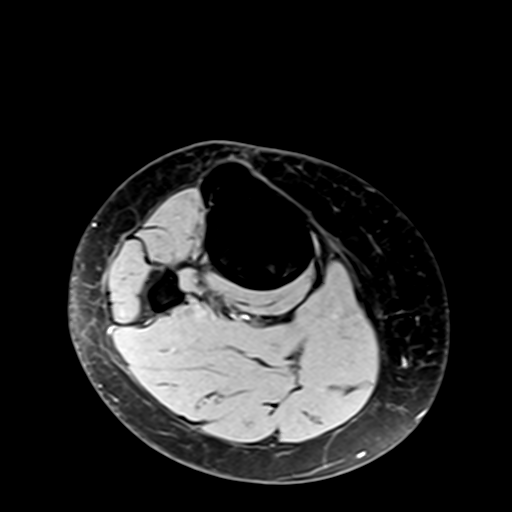
[im 6/27]
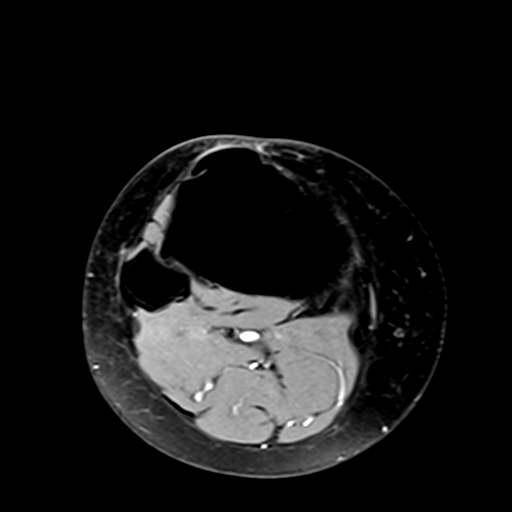
[im 11/27]
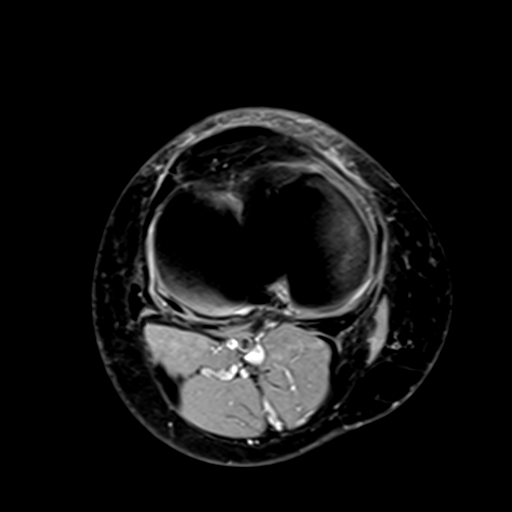
[im 16/27]
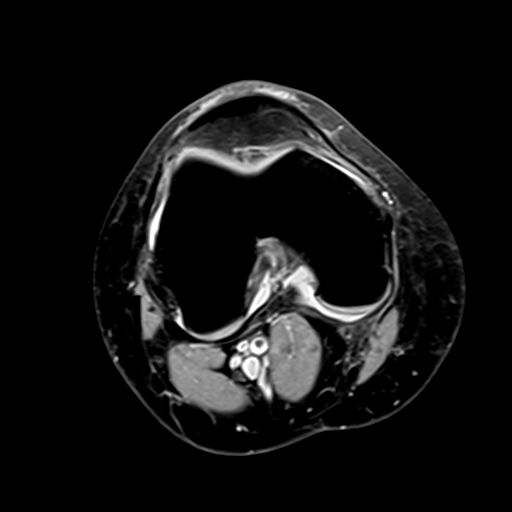
[im 21/27]
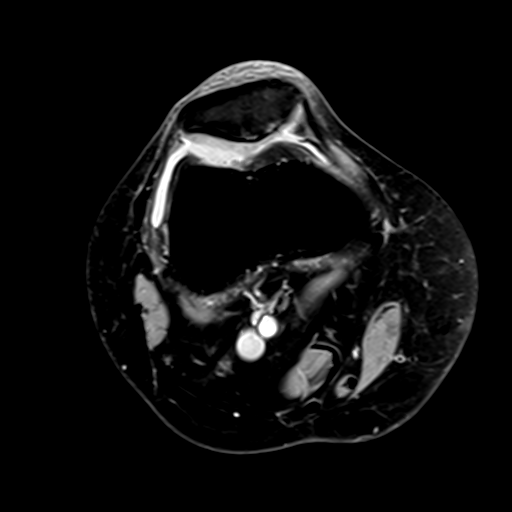
[im 27/27]
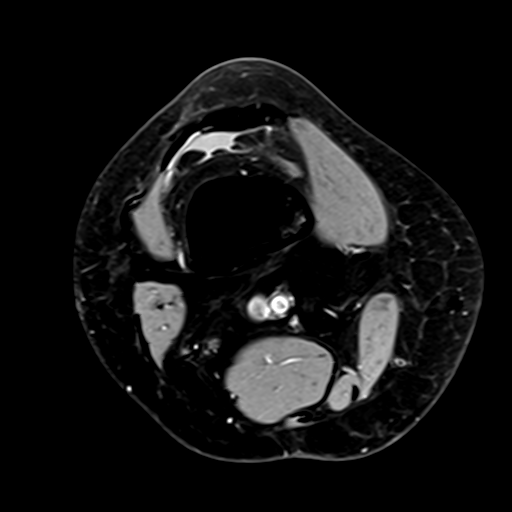

[Series 6: PD fat-sat · coronal · 3.5mm · 0.31mm/px · 6 of 24 slices shown (2 of 4)]
[im 1/24]
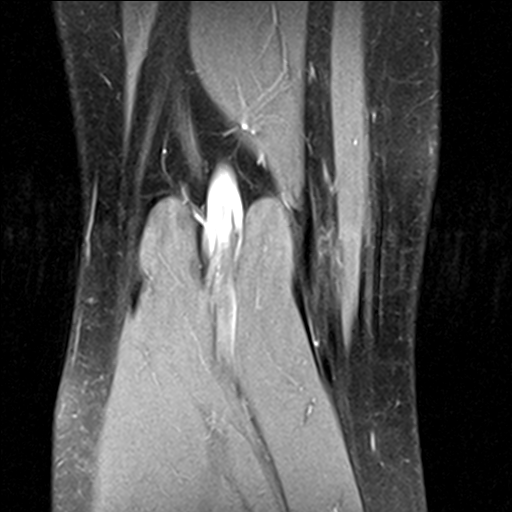
[im 5/24]
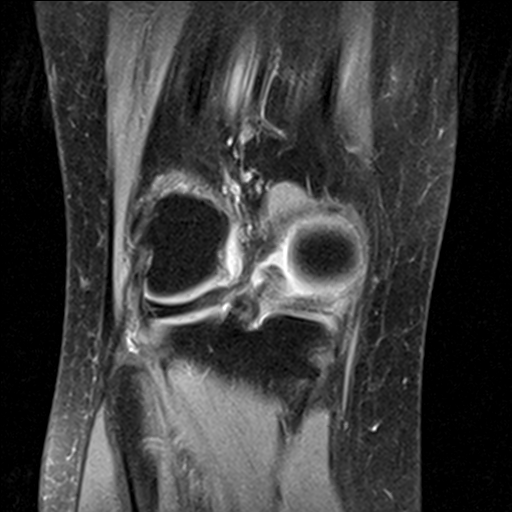
[im 10/24]
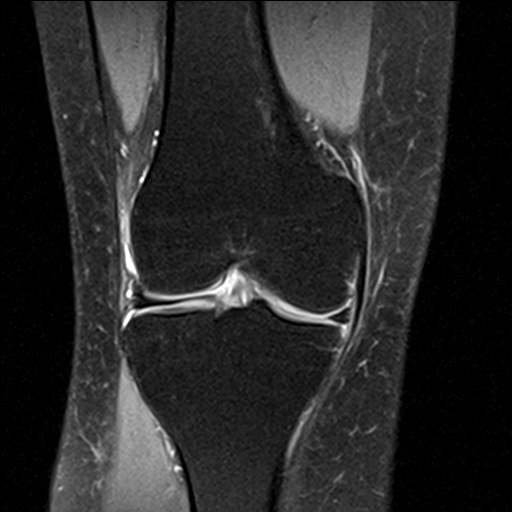
[im 14/24]
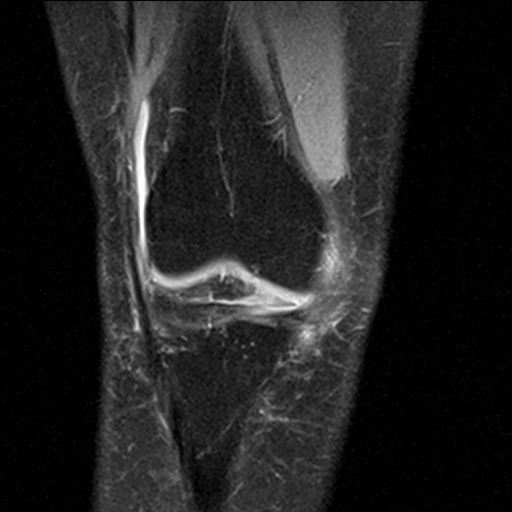
[im 19/24]
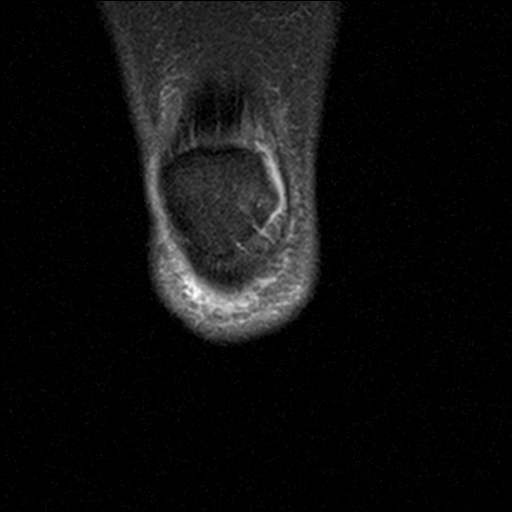
[im 24/24]
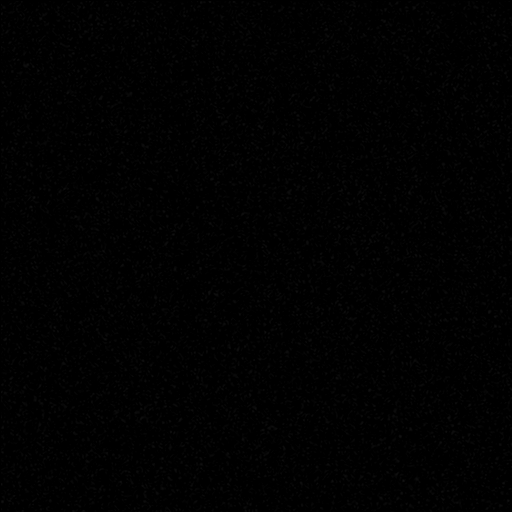

[Series 7: PD fat-sat · sagittal · 3.5mm · 0.31mm/px · 4 of 25 slices shown (3 of 4)]
[im 1/25]
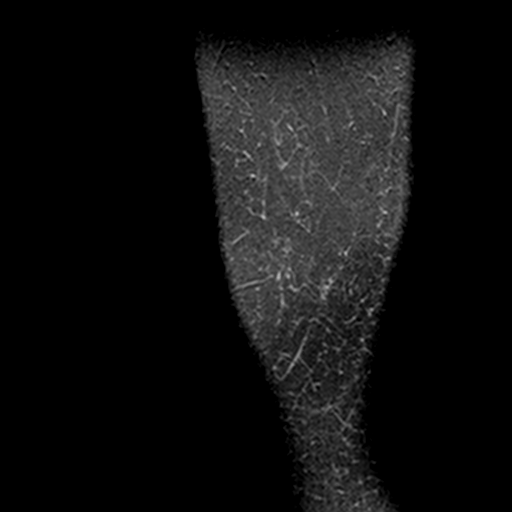
[im 5/25]
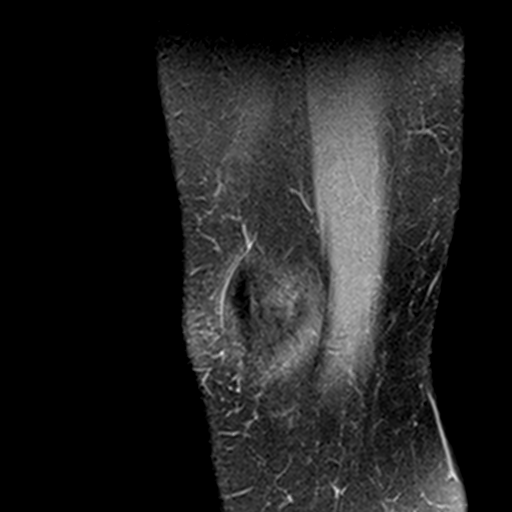
[im 15/25]
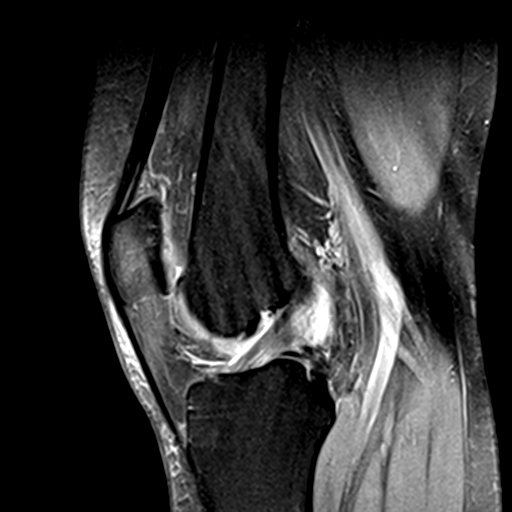
[im 25/25]
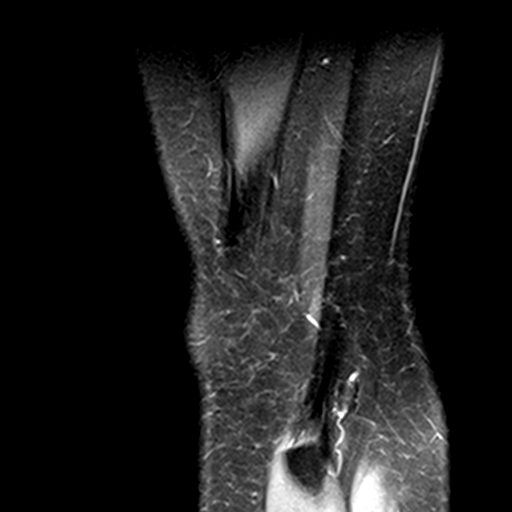

[Series 8: PD fat-sat · coronal · 2.0mm · 0.29mm/px · 3 of 15 slices shown (4 of 4)]
[im 1/15]
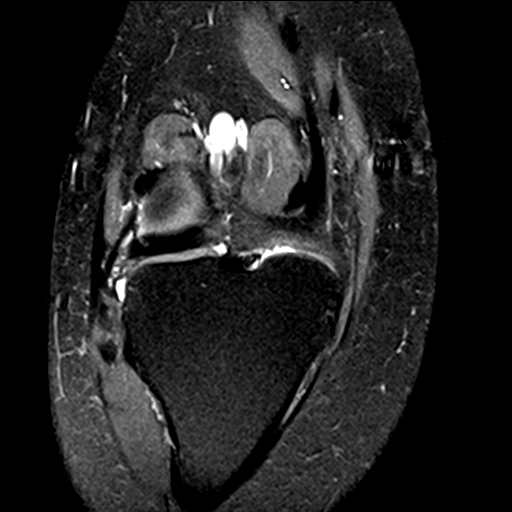
[im 10/15]
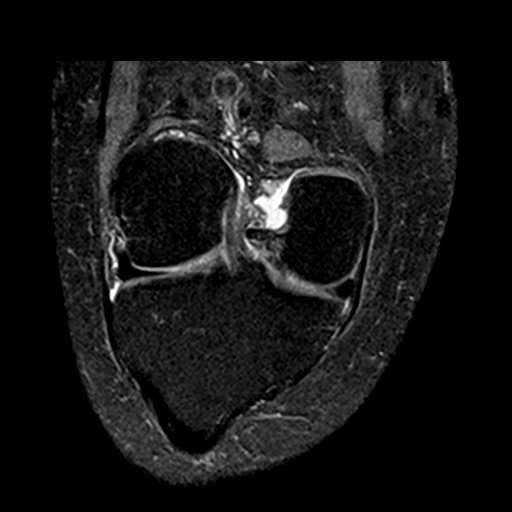
[im 15/15]
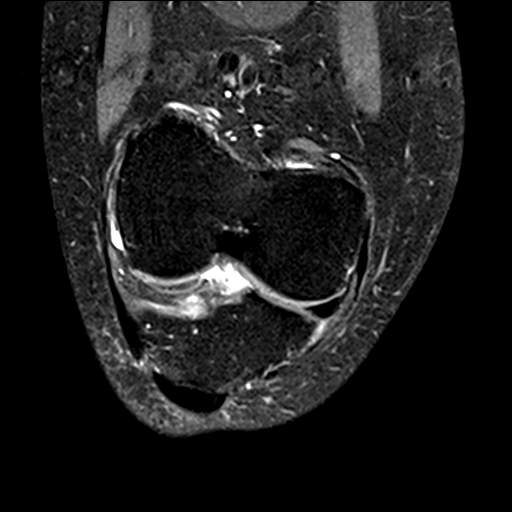

[19 of 40 positions shown; findings below may reference images not displayed]

FINDINGS: MENISCI

Medial meniscus: The patient has a horizontal tear in the posterior
horn reaching the meniscal undersurface. Horizontal tear scratch the
tear extends into the mid meniscal body. No centrally displaced
fragment.

Lateral meniscus:  Intact.

LIGAMENTS

Cruciates:  Intact.

Collaterals:  Intact.

CARTILAGE

Patellofemoral: Cartilage thinning is most notable at the patellar
apex and along the medial facet in the superior pole where there is
minimal underlying subchondral edema.

Medial:  Preserved.

Lateral:  Preserved.

Joint:  Small effusion.

Popliteal Fossa:  No Baker's cyst.

Extensor Mechanism:  Intact.

Bones:  Normal marrow signal throughout.

Other: None.
IMPRESSION: Horizontal tear posterior horn and body of the medial meniscus.

Mild patellofemoral degenerative disease.

## 2018-09-26 ENCOUNTER — Other Ambulatory Visit: Payer: Self-pay | Admitting: Family Medicine
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
## 2018-11-23 ENCOUNTER — Ambulatory Visit: Payer: 59 | Admitting: Family Medicine

## 2018-11-26 ENCOUNTER — Other Ambulatory Visit: Payer: Self-pay | Admitting: Neurology

## 2018-12-08 ENCOUNTER — Other Ambulatory Visit: Payer: Self-pay | Admitting: Family Medicine

## 2018-12-08 NOTE — Telephone Encounter (Signed)
Requesting:butalbital  Contract: UDS: Last OV: Next OV: Last Refill: Database:   Please advise

## 2018-12-22 ENCOUNTER — Other Ambulatory Visit: Payer: Self-pay | Admitting: Neurology

## 2019-03-24 ENCOUNTER — Other Ambulatory Visit: Payer: Self-pay | Admitting: Obstetrics and Gynecology

## 2019-03-24 DIAGNOSIS — Z1231 Encounter for screening mammogram for malignant neoplasm of breast: Secondary | ICD-10-CM

## 2019-05-19 ENCOUNTER — Encounter: Payer: Self-pay | Admitting: Family Medicine

## 2019-05-26 ENCOUNTER — Other Ambulatory Visit: Payer: Self-pay

## 2019-05-26 ENCOUNTER — Ambulatory Visit
Admission: RE | Admit: 2019-05-26 | Discharge: 2019-05-26 | Disposition: A | Discharge status: Home / Self Care | Payer: 59 | Source: Ambulatory Visit | Attending: Obstetrics and Gynecology | Admitting: Obstetrics and Gynecology

## 2019-05-26 DIAGNOSIS — Z1231 Encounter for screening mammogram for malignant neoplasm of breast: Secondary | ICD-10-CM

## 2019-06-01 ENCOUNTER — Encounter: Payer: Self-pay | Admitting: Family Medicine

## 2019-06-01 ENCOUNTER — Other Ambulatory Visit: Payer: Self-pay

## 2019-06-01 ENCOUNTER — Ambulatory Visit (INDEPENDENT_AMBULATORY_CARE_PROVIDER_SITE_OTHER): Payer: 59 | Admitting: Family Medicine

## 2019-06-01 VITALS — BP 100/92 | HR 73 | Temp 97.0°F | Resp 18 | Ht 64.0 in | Wt 152.2 lb

## 2019-06-01 DIAGNOSIS — Z0001 Encounter for general adult medical examination with abnormal findings: Secondary | ICD-10-CM

## 2019-06-01 DIAGNOSIS — G43009 Migraine without aura, not intractable, without status migrainosus: Secondary | ICD-10-CM

## 2019-06-01 DIAGNOSIS — Z203 Contact with and (suspected) exposure to rabies: Secondary | ICD-10-CM

## 2019-06-01 DIAGNOSIS — Z23 Encounter for immunization: Secondary | ICD-10-CM | POA: Diagnosis not present

## 2019-06-01 DIAGNOSIS — E559 Vitamin D deficiency, unspecified: Secondary | ICD-10-CM

## 2019-06-01 LAB — COMPREHENSIVE METABOLIC PANEL
ALT: 20 U/L (ref 0–35)
AST: 23 U/L (ref 0–37)
Albumin: 4.4 g/dL (ref 3.5–5.2)
Alkaline Phosphatase: 67 U/L (ref 39–117)
BUN: 12 mg/dL (ref 6–23)
CO2: 28 mEq/L (ref 19–32)
Calcium: 9.8 mg/dL (ref 8.4–10.5)
Chloride: 102 mEq/L (ref 96–112)
Creatinine, Ser: 0.72 mg/dL (ref 0.40–1.20)
GFR: 87.72 mL/min (ref >60.00)
Glucose, Bld: 76 mg/dL (ref 70–99)
Potassium: 4.1 mEq/L (ref 3.5–5.1)
Sodium: 139 mEq/L (ref 135–145)
Total Bilirubin: 0.5 mg/dL (ref 0.2–1.2)
Total Protein: 6.9 g/dL (ref 6.0–8.3)

## 2019-06-01 LAB — CBC
HCT: 39 % (ref 36.0–46.0)
Hemoglobin: 13.1 g/dL (ref 12.0–15.0)
MCHC: 33.7 g/dL (ref 30.0–36.0)
MCV: 86.8 fl (ref 78.0–100.0)
Platelets: 363 10*3/uL (ref 150.0–400.0)
RBC: 4.49 Mil/uL (ref 3.87–5.11)
RDW: 16.3 % — ABNORMAL HIGH (ref 11.5–15.5)
WBC: 6.2 10*3/uL (ref 4.0–10.5)

## 2019-06-01 LAB — LIPID PANEL
Cholesterol: 197 mg/dL (ref 0–200)
HDL: 100.9 mg/dL (ref >39.00)
LDL Cholesterol: 84 mg/dL (ref 0–99)
NonHDL: 95.62
Total CHOL/HDL Ratio: 2
Triglycerides: 56 mg/dL (ref 0.0–149.0)
VLDL: 11.2 mg/dL (ref 0.0–40.0)

## 2019-06-01 LAB — VITAMIN D 25 HYDROXY (VIT D DEFICIENCY, FRACTURES): VITD: 43.48 ng/mL (ref 30.00–100.00)

## 2019-06-01 LAB — TSH: TSH: 1.58 u[IU]/mL (ref 0.35–4.50)

## 2019-06-01 MED ORDER — MUPIROCIN 2 % EX OINT: 1.0000 "application " | TOPICAL_OINTMENT | Freq: Every evening | CUTANEOUS | 1 refills | Status: DC | PRN

## 2019-06-01 MED ORDER — FLOVENT HFA 110 MCG/ACT IN AERO: INHALATION_SPRAY | RESPIRATORY_TRACT | 3 refills | Status: DC

## 2019-06-01 NOTE — Assessment & Plan Note (Addendum)
Patient encouraged to maintain heart healthy diet, regular exercise, adequate sleep. Consider daily probiotics. Take medications as prescribed. Labs ordered and reviewed. 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 She follows with Dr Quincy Simmonds of OB/GYN for her paps and Mammograms, no concerns

## 2019-06-01 NOTE — Patient Instructions (Signed)
Pulse oximeter   Weekly vitals   Multivitamin with minerals selenium,  zinc, vitamin C and Vitamin d   Preventive Care 16-44 Years Old, Female Preventive care refers to visits with your health care provider and lifestyle choices that can promote health and wellness. This includes:  A yearly physical exam. This may also be called an annual well check.  Regular dental visits and eye exams.  Immunizations.  Screening for certain conditions.  Healthy lifestyle choices, such as eating a healthy diet, getting regular exercise, not using drugs or products that contain nicotine and tobacco, and limiting alcohol use. What can I expect for my preventive care visit? Physical exam Your health care provider will check your:  Height and weight. This may be used to calculate body mass index (BMI), which tells if you are at a healthy weight.  Heart rate and blood pressure.  Skin for abnormal spots. Counseling Your health care provider may ask you questions about your:  Alcohol, tobacco, and drug use.  Emotional well-being.  Home and relationship well-being.  Sexual activity.  Eating habits.  Work and work Statistician.  Method of birth control.  Menstrual cycle.  Pregnancy history. What immunizations do I need?  Influenza (flu) vaccine  This is recommended every year. Tetanus, diphtheria, and pertussis (Tdap) vaccine  You may need a Td booster every 10 years. Varicella (chickenpox) vaccine  You may need this if you have not been vaccinated. Human papillomavirus (HPV) vaccine  If recommended by your health care provider, you may need three doses over 6 months. Measles, mumps, and rubella (MMR) vaccine  You may need at least one dose of MMR. You may also need a second dose. Meningococcal conjugate (MenACWY) vaccine  One dose is recommended if you are age 97-21 years and a first-year college student living in a residence hall, or if you have one of several medical  conditions. You may also need additional booster doses. Pneumococcal conjugate (PCV13) vaccine  You may need this if you have certain conditions and were not previously vaccinated. Pneumococcal polysaccharide (PPSV23) vaccine  You may need one or two doses if you smoke cigarettes or if you have certain conditions. Hepatitis A vaccine  You may need this if you have certain conditions or if you travel or work in places where you may be exposed to hepatitis A. Hepatitis B vaccine  You may need this if you have certain conditions or if you travel or work in places where you may be exposed to hepatitis B. Haemophilus influenzae type b (Hib) vaccine  You may need this if you have certain conditions. You may receive vaccines as individual doses or as more than one vaccine together in one shot (combination vaccines). Talk with your health care provider about the risks and benefits of combination vaccines. What tests do I need?  Blood tests  Lipid and cholesterol levels. These may be checked every 5 years starting at age 1.  Hepatitis C test.  Hepatitis B test. Screening  Diabetes screening. This is done by checking your blood sugar (glucose) after you have not eaten for a while (fasting).  Sexually transmitted disease (STD) testing.  BRCA-related cancer screening. This may be done if you have a family history of breast, ovarian, tubal, or peritoneal cancers.  Pelvic exam and Pap test. This may be done every 3 years starting at age 39. Starting at age 74, this may be done every 5 years if you have a Pap test in combination with an HPV  test. Talk with your health care provider about your test results, treatment options, and if necessary, the need for more tests. Follow these instructions at home: Eating and drinking   Eat a diet that includes fresh fruits and vegetables, whole grains, lean protein, and low-fat dairy.  Take vitamin and mineral supplements as recommended by your health  care provider.  Do not drink alcohol if: ? Your health care provider tells you not to drink. ? You are pregnant, may be pregnant, or are planning to become pregnant.  If you drink alcohol: ? Limit how much you have to 0-1 drink a day. ? Be aware of how much alcohol is in your drink. In the [place] one drink equals one 12 oz bottle of beer (355 mL), one 5 oz glass of wine (148 mL), or one 1 oz glass of hard liquor (44 mL). Lifestyle  Take daily care of your teeth and gums.  Stay active. Exercise for at least 30 minutes on 5 or more days each week.  Do not use any products that contain nicotine or tobacco, such as cigarettes, e-cigarettes, and chewing tobacco. If you need help quitting, ask your health care provider.  If you are sexually active, practice safe sex. Use a condom or other form of birth control (contraception) in order to prevent pregnancy and STIs (sexually transmitted infections). If you plan to become pregnant, see your health care provider for a preconception visit. What's next?  Visit your health care provider once a year for a well check visit.  Ask your health care provider how often you should have your eyes and teeth checked.  Stay up to date on all vaccines. This information is not intended to replace advice given to you by your health care provider. Make sure you discuss any questions you have with your health care provider. Document Released: 10/20/2001 Document Revised: 05/05/2018 Document Reviewed: 05/05/2018 Elsevier Patient Education  2020 [employer].

## 2019-06-01 NOTE — Assessment & Plan Note (Signed)
Tried Aimovig which helped the headaches but caused conistipation so she stopped it. She is taking Vitamin B complex and vitamin D more regularly and she thinks that helps

## 2019-06-01 NOTE — Assessment & Plan Note (Signed)
Supplement and monitor 
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
## 2019-06-21 ENCOUNTER — Ambulatory Visit: Payer: 59 | Admitting: Obstetrics and Gynecology

## 2019-06-21 ENCOUNTER — Encounter: Payer: Self-pay | Admitting: Family Medicine

## 2019-06-21 NOTE — Telephone Encounter (Signed)
I spoke with Tami Mcdonald at [facility] and he states that the test is only set up on Monday, Wednesday and Thursday. The TAT is 30 days from the setup day for results.

## 2019-06-22 ENCOUNTER — Other Ambulatory Visit: Payer: Self-pay

## 2019-06-26 ENCOUNTER — Other Ambulatory Visit: Payer: Self-pay

## 2019-06-26 ENCOUNTER — Encounter: Payer: Self-pay | Admitting: Obstetrics and Gynecology

## 2019-06-26 ENCOUNTER — Ambulatory Visit (INDEPENDENT_AMBULATORY_CARE_PROVIDER_SITE_OTHER): Payer: 59 | Admitting: Obstetrics and Gynecology

## 2019-06-26 VITALS — BP 110/60 | HR 84 | Temp 97.4°F | Resp 12 | Ht 64.0 in | Wt 150.0 lb

## 2019-06-26 DIAGNOSIS — G43809 Other migraine, not intractable, without status migrainosus: Secondary | ICD-10-CM

## 2019-06-26 DIAGNOSIS — Z01419 Encounter for gynecological examination (general) (routine) without abnormal findings: Secondary | ICD-10-CM

## 2019-06-26 NOTE — Patient Instructions (Signed)
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
## 2019-06-27 ENCOUNTER — Ambulatory Visit: Payer: 59 | Admitting: Neurology

## 2019-06-27 ENCOUNTER — Encounter: Payer: Self-pay | Admitting: Neurology

## 2019-06-27 VITALS — BP 115/75 | HR 76 | Ht 64.0 in | Wt 156.4 lb

## 2019-06-27 DIAGNOSIS — G43109 Migraine with aura, not intractable, without status migrainosus: Secondary | ICD-10-CM

## 2019-06-27 LAB — RABIES VIRUS ANTIBODY TITER: Rabies Response: 3.6 IU/mL

## 2019-06-27 MED ORDER — TOPIRAMATE 50 MG PO TABS: 50.0000 mg | ORAL_TABLET | Freq: Every day | ORAL | 3 refills | Status: DC

## 2019-06-27 MED ORDER — UBRELVY 100 MG PO TABS: 1.0000 | ORAL_TABLET | Freq: Every day | ORAL | 0 refills | Status: DC | PRN
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
## 2019-06-27 NOTE — Patient Instructions (Signed)
1.  Start topiramate 3m tablet.  Take 1/2 tablet at bedtime for one week, then increase to 1 tablet at bedtime.  If headaches not improved in 5 weeks, contact me and I will increase dose. 2.  Earliest onset of migraine, take Ubrelvy 1019m  May repeat after 2 hours if needed (maximum 2 tablets in 24 hours).  If effective, [place] prescription. 3.  Limit use of pain relievers to no more than 2 days out of week to prevent risk of rebound or medication-overuse headache. 4.  Keep headache diary 5.  Follow up in 3 to 4 months (virtual)

## 2019-07-05 ENCOUNTER — Telehealth: Payer: Self-pay | Admitting: Neurology

## 2019-07-05 MED ORDER — UBRELVY 100 MG PO TABS: 1.0000 | ORAL_TABLET | Freq: Every day | ORAL | 3 refills | Status: DC | PRN

## 2019-07-05 NOTE — Telephone Encounter (Signed)
Patient left msg with after hours about needing medication refill Tami Mcdonald- works great for her. No other info than that. Thanks!

## 2019-07-05 NOTE — Telephone Encounter (Signed)
Requested Prescriptions   Pending Prescriptions Disp Refills  . Ubrogepant (UBRELVY) 100 MG TABS 16 tablet 3    Sig: Take 1 tablet by mouth daily as needed (at onset of headache may take a extra within 2 hours no more then a max of 200 mg within 24 hours).   Rx sent to pharmacy listed on file

## 2019-09-18 NOTE — Progress Notes (Signed)
Sent request for Key [id number], sent to to be scan. Sent it to cover my meds

## 2019-09-21 NOTE — Progress Notes (Signed)
Request denied.  Must have one month trial of one of the below and fail: Axert Relpax Frova Amerge Maxalt/Maxalt MLT Imitrex Zomig
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
## 2019-10-05 ENCOUNTER — Other Ambulatory Visit: Payer: Self-pay

## 2019-10-05 ENCOUNTER — Telehealth (INDEPENDENT_AMBULATORY_CARE_PROVIDER_SITE_OTHER): Payer: 59 | Admitting: Neurology

## 2019-10-05 ENCOUNTER — Encounter: Payer: Self-pay | Admitting: Neurology

## 2019-10-05 VITALS — Ht 64.0 in | Wt 145.0 lb

## 2019-10-05 DIAGNOSIS — G43109 Migraine with aura, not intractable, without status migrainosus: Secondary | ICD-10-CM

## 2019-10-05 DIAGNOSIS — G43709 Chronic migraine without aura, not intractable, without status migrainosus: Secondary | ICD-10-CM

## 2019-10-05 MED ORDER — NORTRIPTYLINE HCL 10 MG PO CAPS: ORAL_CAPSULE | ORAL | 0 refills | Status: DC

## 2019-10-05 MED ORDER — ONDANSETRON 4 MG PO TBDP: 4.0000 mg | ORAL_TABLET | Freq: Three times a day (TID) | ORAL | 3 refills | Status: DC | PRN

## 2019-10-05 MED ORDER — NURTEC 75 MG PO TBDP: 75.0000 mg | ORAL_TABLET | Freq: Every day | ORAL | 11 refills | Status: DC | PRN

## 2019-10-05 NOTE — Addendum Note (Signed)
Addended byTomi Likens, Dawson Hollman R on: 10/05/2019 08:52 AM   Modules accepted: Orders

## 2019-10-09 ENCOUNTER — Telehealth: Payer: Self-pay | Admitting: Neurology

## 2019-10-09 NOTE — Telephone Encounter (Signed)
Patient is calling in about having an allergic reaction to a medication. She has a very large itchy rash on her arm and is wanting to speak with the nurse about this. Thanks!

## 2019-10-10 ENCOUNTER — Other Ambulatory Visit: Payer: Self-pay

## 2019-10-10 ENCOUNTER — Encounter (HOSPITAL_BASED_OUTPATIENT_CLINIC_OR_DEPARTMENT_OTHER): Payer: Self-pay | Admitting: Emergency Medicine

## 2019-10-10 ENCOUNTER — Emergency Department (HOSPITAL_BASED_OUTPATIENT_CLINIC_OR_DEPARTMENT_OTHER)
Admission: EM | Admit: 2019-10-10 | Discharge: 2019-10-10 | Disposition: A | Discharge status: Home / Self Care | Payer: 59 | Attending: Emergency Medicine | Admitting: Emergency Medicine

## 2019-10-10 ENCOUNTER — Telehealth: Payer: Self-pay

## 2019-10-10 DIAGNOSIS — Z880 Allergy status to penicillin: Secondary | ICD-10-CM | POA: Diagnosis not present

## 2019-10-10 DIAGNOSIS — Z79899 Other long term (current) drug therapy: Secondary | ICD-10-CM | POA: Insufficient documentation

## 2019-10-10 DIAGNOSIS — Z888 Allergy status to other drugs, medicaments and biological substances status: Secondary | ICD-10-CM | POA: Diagnosis not present

## 2019-10-10 DIAGNOSIS — R519 Headache, unspecified: Secondary | ICD-10-CM | POA: Diagnosis present

## 2019-10-10 DIAGNOSIS — Z91018 Allergy to other foods: Secondary | ICD-10-CM | POA: Diagnosis not present

## 2019-10-10 DIAGNOSIS — G43109 Migraine with aura, not intractable, without status migrainosus: Secondary | ICD-10-CM | POA: Diagnosis not present

## 2019-10-10 DIAGNOSIS — J45909 Unspecified asthma, uncomplicated: Secondary | ICD-10-CM | POA: Insufficient documentation

## 2019-10-10 DIAGNOSIS — Z882 Allergy status to sulfonamides status: Secondary | ICD-10-CM | POA: Diagnosis not present

## 2019-10-10 LAB — CBC WITH DIFFERENTIAL/PLATELET
Abs Immature Granulocytes: 0.01 10*3/uL (ref 0.00–0.07)
Basophils Absolute: 0 10*3/uL (ref 0.0–0.1)
Basophils Relative: 1 %
Eosinophils Absolute: 0.1 10*3/uL (ref 0.0–0.5)
Eosinophils Relative: 1 %
HCT: 42.3 % (ref 36.0–46.0)
Hemoglobin: 14 g/dL (ref 12.0–15.0)
Immature Granulocytes: 0 %
Lymphocytes Relative: 24 %
Lymphs Abs: 1.2 10*3/uL (ref 0.7–4.0)
MCH: 29.6 pg (ref 26.0–34.0)
MCHC: 33.1 g/dL (ref 30.0–36.0)
MCV: 89.4 fL (ref 80.0–100.0)
Monocytes Absolute: 0.5 10*3/uL (ref 0.1–1.0)
Monocytes Relative: 9 %
Neutro Abs: 3.2 10*3/uL (ref 1.7–7.7)
Neutrophils Relative %: 65 %
Platelets: 348 10*3/uL (ref 150–400)
RBC: 4.73 MIL/uL (ref 3.87–5.11)
RDW: 12.8 % (ref 11.5–15.5)
WBC: 4.9 10*3/uL (ref 4.0–10.5)
nRBC: 0 % (ref 0.0–0.2)

## 2019-10-10 LAB — PREGNANCY, URINE: Preg Test, Ur: NEGATIVE

## 2019-10-10 LAB — CBG MONITORING, ED: Glucose-Capillary: 77 mg/dL (ref 70–99)

## 2019-10-10 LAB — BASIC METABOLIC PANEL
Anion gap: 9 (ref 5–15)
BUN: 12 mg/dL (ref 6–20)
CO2: 23 mmol/L (ref 22–32)
Calcium: 9.6 mg/dL (ref 8.9–10.3)
Chloride: 105 mmol/L (ref 98–111)
Creatinine, Ser: 0.72 mg/dL (ref 0.44–1.00)
GFR calc Af Amer: >60 mL/min (ref >60)
GFR calc non Af Amer: >60 mL/min (ref >60)
Glucose, Bld: 95 mg/dL (ref 70–99)
Potassium: 4 mmol/L (ref 3.5–5.1)
Sodium: 137 mmol/L (ref 135–145)

## 2019-10-10 MED ORDER — SODIUM CHLORIDE 0.9 % IV BOLUS
1000.0000 mL | Freq: Once | INTRAVENOUS | Status: AC
  Administered 2019-10-10: 1000 mL via INTRAVENOUS

## 2019-10-10 MED ORDER — PROCHLORPERAZINE EDISYLATE 10 MG/2ML IJ SOLN
10.0000 mg | Freq: Once | INTRAMUSCULAR | Status: AC
  Administered 2019-10-10: 10 mg via INTRAVENOUS
  Filled 2019-10-10: qty 2

## 2019-10-10 MED ORDER — DIPHENHYDRAMINE HCL 50 MG/ML IJ SOLN
12.5000 mg | Freq: Once | INTRAMUSCULAR | Status: AC
  Administered 2019-10-10: 12.5 mg via INTRAVENOUS
  Filled 2019-10-10: qty 1

## 2019-10-10 MED ORDER — PREDNISONE 10 MG (21) PO TBPK: ORAL_TABLET | ORAL | 0 refills | Status: DC

## 2019-10-10 MED ORDER — MAGNESIUM SULFATE 2 GM/50ML IV SOLN
2.0000 g | Freq: Once | INTRAVENOUS | Status: AC
  Administered 2019-10-10: 2 g via INTRAVENOUS
  Filled 2019-10-10: qty 50

## 2019-10-10 MED ORDER — DEXAMETHASONE SODIUM PHOSPHATE 10 MG/ML IJ SOLN
8.0000 mg | Freq: Once | INTRAMUSCULAR | Status: AC
  Administered 2019-10-10: 8 mg via INTRAVENOUS
  Filled 2019-10-10: qty 1

## 2019-10-10 NOTE — ED Triage Notes (Signed)
Pt states she has had a migraine for 10 days.  Had some facial droop Thursday, went away, but back today for last hour.  Seen by Neurologist recently for same, called office today and was instructed to go to ED.  Woke up today 0430 with HA.

## 2019-10-10 NOTE — Discharge Instructions (Addendum)
Please follow closely with Dr. Tomi Likens regarding your visit today. Stay hydrated. Take your prescribed medications as directed. Return to the ED for new or concerning symptoms.

## 2019-10-10 NOTE — ED Notes (Signed)
Pt getting dressed, verbalized understanding d/c instructions

## 2019-10-10 NOTE — ED Notes (Signed)
ED Provider at bedside-EDP Dr Melina Copa

## 2019-10-10 NOTE — Telephone Encounter (Signed)
Recommend prednisone taper to break intractable headache.  She should be advised not to take NSAIDs while on the prednisone due to potential GI upset.
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
## 2019-10-10 NOTE — Telephone Encounter (Signed)
Lip droop and slurred speech, advised to go to ER.

## 2019-10-10 NOTE — ED Notes (Signed)
Pt reports pain decreased, 7/10, however pt now reports "aura of flashing light" that is typical with migraine. EDP [place] notified.

## 2019-10-10 NOTE — Telephone Encounter (Signed)
persisent headache this am ongoing for a week, any suggestions, please advise. [phone].

## 2019-10-18 ENCOUNTER — Telehealth: Payer: Self-pay | Admitting: Neurology

## 2019-10-18 NOTE — Telephone Encounter (Signed)
Patient called in regarding her Nortriptyline medication. She said that she does not feel that she will be able to continue taking the medication. She has had an elevated heart Rate increased BP and Nausea. Please Call. Thank you

## 2019-10-18 NOTE — Telephone Encounter (Signed)
Bloodpressure 135/85, normal 110/60 for her, not anxious or anything. Feels like the nortiptyline is feeling unemotional unstable. Doesn't feel comfortable with the meds. To call 10/19/2019

## 2019-10-19 NOTE — Telephone Encounter (Signed)
Patient refuses Emgality due to allergic reaction.  Patient does not want to try any other mind altering drugs.  She will process the idea of venlafaxine.  She does not feel comfortable doing botox due to allergies.  She feels like she is out of other safe options.  Patient is going to go back on KETO diet.  Any other ideas?

## 2019-10-19 NOTE — Telephone Encounter (Signed)
Not really.  Those are my options.  If she agrees to venlafaxine, she should let us know

## 2019-10-19 NOTE — Telephone Encounter (Signed)
Will wait for patients decision.

## 2019-10-19 NOTE — Telephone Encounter (Signed)
I agree that we should discontinue nortriptyline.  I would like to present her with these options:   If willing, we can try Emgality, which is one of the other monthly self-injections (perhaps she won't have an injection site reaction or constipation, as she did with Aimovig) We can submit for prior approval for Botox, which I perform in the office every 3 months We can try a different oral medication.  Recommendation would be venlafaxine XR.

## 2019-10-24 ENCOUNTER — Other Ambulatory Visit: Payer: Self-pay | Admitting: Neurology

## 2019-11-02 ENCOUNTER — Telehealth: Payer: Self-pay | Admitting: Neurology

## 2019-11-02 ENCOUNTER — Other Ambulatory Visit: Payer: Self-pay | Admitting: Neurology

## 2019-11-02 MED ORDER — BUTALBITAL-APAP-CAFFEINE 50-325-40 MG PO TABS: 1.0000 | ORAL_TABLET | Freq: Four times a day (QID) | ORAL | 3 refills | Status: DC | PRN

## 2019-11-02 NOTE — Telephone Encounter (Signed)
Patient has called and would like our office to call [facility] with a Verbal order so she can pick up her medication today. Please Call. Thank you

## 2019-11-02 NOTE — Telephone Encounter (Signed)
Pharmacy called in needing to get a Verbal order on this patient's Controlled substance medication. He said you can call [phone] for a Verbal order. Thank you

## 2019-11-02 NOTE — Telephone Encounter (Signed)
Pt called informed that medication was called into [facility]

## 2019-11-06 ENCOUNTER — Other Ambulatory Visit: Payer: Self-pay

## 2019-11-06 ENCOUNTER — Ambulatory Visit
Admission: RE | Admit: 2019-11-06 | Discharge: 2019-11-06 | Disposition: A | Discharge status: Home / Self Care | Payer: 59 | Source: Ambulatory Visit | Attending: Neurology | Admitting: Neurology

## 2019-11-06 DIAGNOSIS — G43109 Migraine with aura, not intractable, without status migrainosus: Secondary | ICD-10-CM

## 2019-11-06 MED ORDER — GADOBENATE DIMEGLUMINE 529 MG/ML IV SOLN
13.0000 mL | Freq: Once | INTRAVENOUS | Status: AC | PRN
  Administered 2019-11-06: 13 mL via INTRAVENOUS

## 2019-11-17 ENCOUNTER — Ambulatory Visit: Payer: 59

## 2019-12-30 ENCOUNTER — Other Ambulatory Visit: Payer: Self-pay | Admitting: Family Medicine
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
## 2020-02-01 ENCOUNTER — Encounter: Payer: Self-pay | Admitting: Neurology

## 2020-02-01 ENCOUNTER — Other Ambulatory Visit: Payer: Self-pay

## 2020-02-01 ENCOUNTER — Ambulatory Visit: Payer: 59 | Admitting: Neurology

## 2020-02-01 VITALS — BP 113/77 | HR 72 | Ht 64.0 in | Wt 155.4 lb

## 2020-02-01 DIAGNOSIS — G43109 Migraine with aura, not intractable, without status migrainosus: Secondary | ICD-10-CM | POA: Diagnosis not present

## 2020-02-01 NOTE — Patient Instructions (Signed)
1.  For rescue, try Reyvow 135m.  No more than 1 tablet in 24 hours.  Advised not to drive for 8 hours.  If effective, contact me for prescription 2.  May use butalbital but sparingly 3.  Limit use of pain relievers to no more than 2 days out of week to prevent risk of rebound or medication-overuse headache. 4.  Keep headache diary 5.  Follow up 4 months.

## 2020-02-26 ENCOUNTER — Other Ambulatory Visit: Payer: Self-pay | Admitting: Neurology

## 2020-02-26 NOTE — Telephone Encounter (Signed)
FYI

## 2020-04-11 ENCOUNTER — Other Ambulatory Visit: Payer: Self-pay | Admitting: Obstetrics and Gynecology

## 2020-04-11 DIAGNOSIS — Z1231 Encounter for screening mammogram for malignant neoplasm of breast: Secondary | ICD-10-CM

## 2020-05-29 ENCOUNTER — Ambulatory Visit
Admission: RE | Admit: 2020-05-29 | Discharge: 2020-05-29 | Disposition: A | Discharge status: Home / Self Care | Payer: 59 | Source: Ambulatory Visit | Attending: Obstetrics and Gynecology | Admitting: Obstetrics and Gynecology

## 2020-05-29 ENCOUNTER — Other Ambulatory Visit: Payer: Self-pay

## 2020-05-29 DIAGNOSIS — Z1231 Encounter for screening mammogram for malignant neoplasm of breast: Secondary | ICD-10-CM

## 2020-06-06 ENCOUNTER — Other Ambulatory Visit: Payer: Self-pay

## 2020-06-06 ENCOUNTER — Ambulatory Visit (INDEPENDENT_AMBULATORY_CARE_PROVIDER_SITE_OTHER): Payer: 59 | Admitting: Family Medicine

## 2020-06-06 ENCOUNTER — Encounter: Payer: Self-pay | Admitting: Family Medicine

## 2020-06-06 VITALS — BP 100/62 | HR 72 | Temp 98.0°F | Resp 12 | Ht 64.4 in | Wt 147.6 lb

## 2020-06-06 DIAGNOSIS — J3489 Other specified disorders of nose and nasal sinuses: Secondary | ICD-10-CM

## 2020-06-06 DIAGNOSIS — G43009 Migraine without aura, not intractable, without status migrainosus: Secondary | ICD-10-CM | POA: Diagnosis not present

## 2020-06-06 DIAGNOSIS — E559 Vitamin D deficiency, unspecified: Secondary | ICD-10-CM | POA: Diagnosis not present

## 2020-06-06 DIAGNOSIS — J45909 Unspecified asthma, uncomplicated: Secondary | ICD-10-CM

## 2020-06-06 DIAGNOSIS — Z23 Encounter for immunization: Secondary | ICD-10-CM | POA: Diagnosis not present

## 2020-06-06 DIAGNOSIS — Z0000 Encounter for general adult medical examination without abnormal findings: Secondary | ICD-10-CM
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
## 2020-06-06 NOTE — Assessment & Plan Note (Signed)
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 Referred to ENT

## 2020-06-06 NOTE — Assessment & Plan Note (Signed)
supplement and monitor

## 2020-06-06 NOTE — Patient Instructions (Signed)
Preventive Care 21-45 Years Old, Female Preventive care refers to visits with your health care provider and lifestyle choices that can promote health and wellness. This includes:  A yearly physical exam. This may also be called an annual well check.  Regular dental visits and eye exams.  Immunizations.  Screening for certain conditions.  Healthy lifestyle choices, such as eating a healthy diet, getting regular exercise, not using drugs or products that contain nicotine and tobacco, and limiting alcohol use. What can I expect for my preventive care visit? Physical exam Your health care provider will check your:  Height and weight. This may be used to calculate body mass index (BMI), which tells if you are at a healthy weight.  Heart rate and blood pressure.  Skin for abnormal spots. Counseling Your health care provider may ask you questions about your:  Alcohol, tobacco, and drug use.  Emotional well-being.  Home and relationship well-being.  Sexual activity.  Eating habits.  Work and work environment.  Method of birth control.  Menstrual cycle.  Pregnancy history. What immunizations do I need?  Influenza (flu) vaccine  This is recommended every year. Tetanus, diphtheria, and pertussis (Tdap) vaccine  You may need a Td booster every 10 years. Varicella (chickenpox) vaccine  You may need this if you have not been vaccinated. Human papillomavirus (HPV) vaccine  If recommended by your health care provider, you may need three doses over 6 months. Measles, mumps, and rubella (MMR) vaccine  You may need at least one dose of MMR. You may also need a second dose. Meningococcal conjugate (MenACWY) vaccine  One dose is recommended if you are age 19-21 years and a first-year college student living in a residence hall, or if you have one of several medical conditions. You may also need additional booster doses. Pneumococcal conjugate (PCV13) vaccine  You may need  this if you have certain conditions and were not previously vaccinated. Pneumococcal polysaccharide (PPSV23) vaccine  You may need one or two doses if you smoke cigarettes or if you have certain conditions. Hepatitis A vaccine  You may need this if you have certain conditions or if you travel or work in places where you may be exposed to hepatitis A. Hepatitis B vaccine  You may need this if you have certain conditions or if you travel or work in places where you may be exposed to hepatitis B. Haemophilus influenzae type b (Hib) vaccine  You may need this if you have certain conditions. You may receive vaccines as individual doses or as more than one vaccine together in one shot (combination vaccines). Talk with your health care provider about the risks and benefits of combination vaccines. What tests do I need?  Blood tests  Lipid and cholesterol levels. These may be checked every 5 years starting at age 20.  Hepatitis C test.  Hepatitis B test. Screening  Diabetes screening. This is done by checking your blood sugar (glucose) after you have not eaten for a while (fasting).  Sexually transmitted disease (STD) testing.  BRCA-related cancer screening. This may be done if you have a family history of breast, ovarian, tubal, or peritoneal cancers.  Pelvic exam and Pap test. This may be done every 3 years starting at age 21. Starting at age 30, this may be done every 5 years if you have a Pap test in combination with an HPV test. Talk with your health care provider about your test results, treatment options, and if necessary, the need for more tests.   Follow these instructions at home: Eating and drinking   Eat a diet that includes fresh fruits and vegetables, whole grains, lean protein, and low-fat dairy.  Take vitamin and mineral supplements as recommended by your health care provider.  Do not drink alcohol if: ? Your health care provider tells you not to drink. ? You are  pregnant, may be pregnant, or are planning to become pregnant.  If you drink alcohol: ? Limit how much you have to 0-1 drink a day. ? Be aware of how much alcohol is in your drink. In the [place] one drink equals one 12 oz bottle of beer (355 mL), one 5 oz glass of wine (148 mL), or one 1 oz glass of hard liquor (44 mL). Lifestyle  Take daily care of your teeth and gums.  Stay active. Exercise for at least 30 minutes on 5 or more days each week.  Do not use any products that contain nicotine or tobacco, such as cigarettes, e-cigarettes, and chewing tobacco. If you need help quitting, ask your health care provider.  If you are sexually active, practice safe sex. Use a condom or other form of birth control (contraception) in order to prevent pregnancy and STIs (sexually transmitted infections). If you plan to become pregnant, see your health care provider for a preconception visit. What's next?  Visit your health care provider once a year for a well check visit.  Ask your health care provider how often you should have your eyes and teeth checked.  Stay up to date on all vaccines. This information is not intended to replace advice given to you by your health care provider. Make sure you discuss any questions you have with your health care provider. Document Revised: 05/05/2018 Document Reviewed: 05/05/2018 Elsevier Patient Education  2020 [employer].

## 2020-06-06 NOTE — Assessment & Plan Note (Addendum)
Patient encouraged to maintain heart healthy diet, regular exercise, adequate sleep. Consider daily probiotics. Take medications as prescribed. Labs ordered and reviewed. Avoids dairy. Given flu shot today

## 2020-06-06 NOTE — Assessment & Plan Note (Addendum)
Encouraged increased hydration, 64 ounces of clear fluids daily. Minimize alcohol and caffeine. Eat small frequent meals with lean proteins and complex carbs. Avoid high and low blood sugars. Get adequate sleep, 7-8 hours a night. Needs exercise daily preferably in the morning. Follows with neurology they prompted her to quit her job at the [employer] and that has helped some. She has had less stroke like symptoms but is still having headaches.Her new job is 3 days a week and every 3 rd Saturday. Keto diet helped. She is taking numerous supplements. Her mother has migraines and they recommend checking homocysteine, vit b12 and folate, ordered

## 2020-06-06 NOTE — Assessment & Plan Note (Addendum)
Mild, intermittent. Using Flovent 110 mcg bid and has not needed albuterol for years will try dropping to 44 mcg dose 2 puffs po bid and monitor

## 2020-06-07 LAB — HOMOCYSTEINE: Homocysteine: 7.7 umol/L (ref <10.4)

## 2020-06-10 LAB — COMPREHENSIVE METABOLIC PANEL
AG Ratio: 2 (calc) (ref 1.0–2.5)
ALT: 25 U/L (ref 6–29)
AST: 20 U/L (ref 10–35)
Albumin: 4.6 g/dL (ref 3.6–5.1)
Alkaline phosphatase (APISO): 54 U/L (ref 31–125)
BUN: 8 mg/dL (ref 7–25)
CO2: 27 mmol/L (ref 20–32)
Calcium: 9.7 mg/dL (ref 8.6–10.2)
Chloride: 105 mmol/L (ref 98–110)
Creat: 0.85 mg/dL (ref 0.50–1.10)
Globulin: 2.3 g/dL (calc) (ref 1.9–3.7)
Glucose, Bld: 115 mg/dL — ABNORMAL HIGH (ref 65–99)
Potassium: 4.5 mmol/L (ref 3.5–5.3)
Sodium: 140 mmol/L (ref 135–146)
Total Bilirubin: 0.4 mg/dL (ref 0.2–1.2)
Total Protein: 6.9 g/dL (ref 6.1–8.1)

## 2020-06-10 LAB — LIPID PANEL
Cholesterol: 239 mg/dL — ABNORMAL HIGH (ref <200)
HDL: 102 mg/dL (ref >=50)
LDL Cholesterol (Calc): 119 mg/dL (calc) — ABNORMAL HIGH
Non-HDL Cholesterol (Calc): 137 mg/dL (calc) — ABNORMAL HIGH (ref <130)
Total CHOL/HDL Ratio: 2.3 (calc) (ref <5.0)
Triglycerides: 81 mg/dL (ref <150)

## 2020-06-10 LAB — CBC
HCT: 42.9 % (ref 35.0–45.0)
Hemoglobin: 13.9 g/dL (ref 11.7–15.5)
MCH: 29.4 pg (ref 27.0–33.0)
MCHC: 32.4 g/dL (ref 32.0–36.0)
MCV: 90.9 fL (ref 80.0–100.0)
MPV: 10.2 fL (ref 7.5–12.5)
Platelets: 394 10*3/uL (ref 140–400)
RBC: 4.72 10*6/uL (ref 3.80–5.10)
RDW: 13.6 % (ref 11.0–15.0)
WBC: 4.5 10*3/uL (ref 3.8–10.8)

## 2020-06-10 LAB — TEST AUTHORIZATION

## 2020-06-10 LAB — B12 AND FOLATE PANEL
Folate: 20.6 ng/mL
Vitamin B-12: 657 pg/mL (ref 200–1100)

## 2020-06-10 LAB — TSH: TSH: 1.74 mIU/L

## 2020-06-10 LAB — VITAMIN D 25 HYDROXY (VIT D DEFICIENCY, FRACTURES): Vit D, 25-Hydroxy: 35 ng/mL (ref 30–100)

## 2020-06-10 LAB — HEMOGLOBIN A1C W/OUT EAG: Hgb A1c MFr Bld: 4.9 % of total Hgb (ref <5.7)
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
## 2020-06-13 ENCOUNTER — Ambulatory Visit: Payer: 59 | Admitting: Neurology

## 2020-06-13 ENCOUNTER — Encounter: Payer: Self-pay | Admitting: Neurology

## 2020-06-13 ENCOUNTER — Other Ambulatory Visit: Payer: Self-pay

## 2020-06-13 VITALS — BP 119/78 | HR 67 | Ht 64.0 in | Wt 152.4 lb

## 2020-06-13 DIAGNOSIS — G43109 Migraine with aura, not intractable, without status migrainosus: Secondary | ICD-10-CM

## 2020-06-13 DIAGNOSIS — G43709 Chronic migraine without aura, not intractable, without status migrainosus: Secondary | ICD-10-CM | POA: Diagnosis not present

## 2020-06-13 NOTE — Patient Instructions (Signed)
  1. Limit use of pain relievers to no more than 2 days out of the week.  These medications include acetaminophen, NSAIDs (ibuprofen/Advil/Motrin, naproxen/Aleve, triptans (Imitrex/sumatriptan), Excedrin, and narcotics.  This will help reduce risk of rebound headaches. 2. Be aware of common food triggers:  - Caffeine:  coffee, black tea, cola, Mt. Dew  - Chocolate  - Dairy:  aged cheeses (brie, blue, cheddar, gouda, [place], provolone, [place], Swiss, etc), chocolate milk, buttermilk, sour cream, limit eggs and yogurt  - Nuts, peanut butter  - Alcohol  - Cereals/grains:  FRESH breads (fresh bagels, sourdough, doughnuts), yeast productions  - Processed/canned/aged/cured meats (pre-packaged deli meats, hotdogs)  - MSG/glutamate:  soy sauce, flavor enhancer, pickled/preserved/marinated foods  - Sweeteners:  aspartame (Equal, Nutrasweet).  Sugar and Splenda are okay  - Vegetables:  legumes (lima beans, lentils, snow peas, fava beans, pinto peans, peas, garbanzo beans), sauerkraut, onions, olives, pickles  - Fruit:  avocados, bananas, citrus fruit (orange, lemon, grapefruit), mango  - Other:  Frozen meals, macaroni and cheese 3. Routine exercise 4. Stay adequately hydrated (aim for 64 oz water daily) 5. Keep headache diary 6. Maintain proper stress management 7. Maintain proper sleep hygiene 8. Do not skip meals 9. Consider supplements:  magnesium citrate 484m daily, riboflavin 4063mdaily, coenzyme Q10 10046mhree times daily. 10. Follow up in 4 months

## 2020-06-14 ENCOUNTER — Encounter: Payer: Self-pay | Admitting: Family Medicine

## 2020-06-14 ENCOUNTER — Other Ambulatory Visit: Payer: Self-pay | Admitting: Family Medicine

## 2020-06-14 MED ORDER — FLOVENT HFA 44 MCG/ACT IN AERO: 2.0000 | INHALATION_SPRAY | Freq: Two times a day (BID) | RESPIRATORY_TRACT | 5 refills | Status: DC

## 2020-06-20 ENCOUNTER — Other Ambulatory Visit: Payer: Self-pay | Admitting: Family Medicine

## 2020-06-20 ENCOUNTER — Encounter (HOSPITAL_COMMUNITY): Payer: Self-pay | Admitting: Emergency Medicine

## 2020-06-20 ENCOUNTER — Ambulatory Visit (HOSPITAL_COMMUNITY)
Admission: EM | Admit: 2020-06-20 | Discharge: 2020-06-20 | Disposition: A | Discharge status: Home / Self Care | Payer: 59 | Attending: Family Medicine | Admitting: Family Medicine

## 2020-06-20 ENCOUNTER — Telehealth: Payer: Self-pay | Admitting: Family Medicine

## 2020-06-20 ENCOUNTER — Other Ambulatory Visit: Payer: Self-pay

## 2020-06-20 DIAGNOSIS — K645 Perianal venous thrombosis: Secondary | ICD-10-CM | POA: Diagnosis not present

## 2020-06-20 DIAGNOSIS — K649 Unspecified hemorrhoids: Secondary | ICD-10-CM
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
## 2020-06-20 NOTE — Telephone Encounter (Signed)
If the appointment takes too long we might also try general surgery. They might be able to help also

## 2020-06-20 NOTE — ED Triage Notes (Signed)
Pt c/o thrombosed hemorrhoid that has been bleeding and is painful x 2 days.

## 2020-06-20 NOTE — Discharge Instructions (Addendum)
Sitz baths, keep area clean.  Follow up as needed for continued or worsening symptoms

## 2020-06-20 NOTE — Telephone Encounter (Signed)
She needs to be referred to [facility] I will refer to Dr Silverio Decamp

## 2020-06-20 NOTE — Telephone Encounter (Signed)
Caller name: Yisell Call back number: [phone]  Patient states she has thrombosed hemorrhoid she's been calling different urgent care and they not able to help her. Patient would like suggestion where to go.

## 2020-06-21 NOTE — Telephone Encounter (Signed)
Called pt to f/u with concern (Thrombose hemrroid) Pt went to urgent care and got it taken care of. No other f/u needed at this time

## 2020-06-26 ENCOUNTER — Ambulatory Visit: Payer: Self-pay | Admitting: Obstetrics and Gynecology

## 2020-07-10 ENCOUNTER — Encounter: Payer: Self-pay | Admitting: Neurology

## 2020-07-10 NOTE — Progress Notes (Signed)
Needing patient's new insurance info so I can run benefit verification for PA.  11/1-LMOM for ins info; 11/2-sent MyChart msg; 11/3-LMOM

## 2020-07-14 DIAGNOSIS — Z03818 Encounter for observation for suspected exposure to other biological agents ruled out: Secondary | ICD-10-CM | POA: Diagnosis not present

## 2020-07-14 DIAGNOSIS — Z20822 Contact with and (suspected) exposure to covid-19: Secondary | ICD-10-CM | POA: Diagnosis not present

## 2020-07-16 NOTE — Progress Notes (Addendum)
Patient PA: Approved valid from 07/19/20 to 01/15/21 for 2 visits. Ref #: [id number]. 200 units.   Per BV: Patient SP is [facility] which is not taking new patients for Botox so we have to [facility]. PA is required.   11/9-submitted BV forms.

## 2020-07-19 ENCOUNTER — Ambulatory Visit: Payer: 59 | Admitting: Neurology

## 2020-07-26 ENCOUNTER — Encounter: Payer: Self-pay | Admitting: Neurology

## 2020-07-26 ENCOUNTER — Other Ambulatory Visit: Payer: Self-pay | Admitting: Neurology

## 2020-07-26 MED ORDER — VENLAFAXINE HCL ER 37.5 MG PO CP24: ORAL_CAPSULE | ORAL | 0 refills | Status: DC

## 2020-07-26 NOTE — Progress Notes (Signed)
Tami Mcdonald [name] - PA Case ID: [id number] Need help? Call us at [phone] Status Sent to [facility] Venlafaxine HCl ER 37.5MG er capsules Investment banker, operational PA Form [phone] NCPDP)

## 2020-07-27 ENCOUNTER — Other Ambulatory Visit: Payer: Self-pay | Admitting: Neurology

## 2020-07-30 ENCOUNTER — Other Ambulatory Visit: Payer: Self-pay | Admitting: Neurology

## 2020-07-30 ENCOUNTER — Telehealth: Payer: Self-pay | Admitting: Neurology

## 2020-07-30 MED ORDER — VENLAFAXINE HCL ER 75 MG PO CP24: 75.0000 mg | ORAL_CAPSULE | Freq: Every day | ORAL | 5 refills | Status: DC

## 2020-07-30 NOTE — Telephone Encounter (Signed)
Script for venlafaxine XR 81m daily [facility] to [facility]

## 2020-08-13 DIAGNOSIS — Z20822 Contact with and (suspected) exposure to covid-19: Secondary | ICD-10-CM | POA: Diagnosis not present

## 2020-08-13 DIAGNOSIS — Z03818 Encounter for observation for suspected exposure to other biological agents ruled out: Secondary | ICD-10-CM | POA: Diagnosis not present

## 2020-08-16 ENCOUNTER — Ambulatory Visit: Payer: BC Managed Care – PPO | Admitting: Neurology

## 2020-08-18 DIAGNOSIS — J40 Bronchitis, not specified as acute or chronic: Secondary | ICD-10-CM | POA: Diagnosis not present

## 2020-08-18 DIAGNOSIS — Z03818 Encounter for observation for suspected exposure to other biological agents ruled out: Secondary | ICD-10-CM | POA: Diagnosis not present

## 2020-08-18 DIAGNOSIS — J45909 Unspecified asthma, uncomplicated: Secondary | ICD-10-CM | POA: Diagnosis not present

## 2020-08-19 ENCOUNTER — Other Ambulatory Visit: Payer: Self-pay | Admitting: Neurology

## 2020-08-21 ENCOUNTER — Encounter: Payer: Self-pay | Admitting: Family Medicine

## 2020-08-25 ENCOUNTER — Other Ambulatory Visit: Payer: Self-pay | Admitting: Neurology
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
## 2020-11-14 ENCOUNTER — Other Ambulatory Visit: Payer: Self-pay

## 2020-11-14 ENCOUNTER — Encounter: Payer: Self-pay | Admitting: Neurology

## 2020-11-14 ENCOUNTER — Ambulatory Visit: Payer: BC Managed Care – PPO | Admitting: Neurology

## 2020-11-14 VITALS — BP 109/70 | HR 82 | Ht 64.0 in | Wt 154.0 lb

## 2020-11-14 DIAGNOSIS — G43109 Migraine with aura, not intractable, without status migrainosus: Secondary | ICD-10-CM | POA: Diagnosis not present

## 2020-11-14 MED ORDER — ELYXYB 120 MG/4.8ML PO SOLN: 120.0000 mg | Freq: Every day | ORAL | 5 refills | Status: DC | PRN

## 2020-11-14 MED ORDER — TIZANIDINE HCL 4 MG PO TABS: ORAL_TABLET | ORAL | 5 refills | Status: DC

## 2020-11-14 NOTE — Patient Instructions (Signed)
1.  Continue venlafaxine XR 16m daily 2.  For migraine rescue, try Elyxyb (maximum 1 per 24 hours).  If not effective, continue Fioricet 3.  Limit use of pain relievers to no more than 2 days out of week to prevent risk of rebound or medication-overuse headache. 4.  Keep headache diary 5.  Follow up 4 to 6 months.

## 2020-11-20 ENCOUNTER — Encounter: Payer: Self-pay | Admitting: Neurology

## 2020-11-20 NOTE — Progress Notes (Signed)
Received fax approval valid from 11/20/20 to 11/20/21.

## 2020-11-21 ENCOUNTER — Encounter: Payer: Self-pay | Admitting: Obstetrics and Gynecology

## 2020-11-21 ENCOUNTER — Other Ambulatory Visit: Payer: Self-pay

## 2020-11-21 ENCOUNTER — Ambulatory Visit: Payer: BC Managed Care – PPO | Admitting: Obstetrics and Gynecology

## 2020-11-21 VITALS — BP 118/78 | HR 78 | Ht 64.0 in | Wt 158.0 lb

## 2020-11-21 DIAGNOSIS — Z01419 Encounter for gynecological examination (general) (routine) without abnormal findings: Secondary | ICD-10-CM

## 2020-11-21 NOTE — Patient Instructions (Signed)
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
## 2020-12-05 DIAGNOSIS — H43813 Vitreous degeneration, bilateral: Secondary | ICD-10-CM | POA: Diagnosis not present

## 2020-12-05 DIAGNOSIS — D23121 Other benign neoplasm of skin of left upper eyelid, including canthus: Secondary | ICD-10-CM | POA: Diagnosis not present

## 2020-12-05 DIAGNOSIS — H5213 Myopia, bilateral: Secondary | ICD-10-CM | POA: Diagnosis not present

## 2020-12-05 DIAGNOSIS — H1789 Other corneal scars and opacities: Secondary | ICD-10-CM | POA: Diagnosis not present

## 2021-01-06 DIAGNOSIS — L820 Inflamed seborrheic keratosis: Secondary | ICD-10-CM | POA: Diagnosis not present

## 2021-01-06 DIAGNOSIS — D23121 Other benign neoplasm of skin of left upper eyelid, including canthus: Secondary | ICD-10-CM | POA: Diagnosis not present

## 2021-01-18 ENCOUNTER — Other Ambulatory Visit: Payer: Self-pay | Admitting: Neurology

## 2021-01-20 NOTE — Telephone Encounter (Signed)
Patient wants to continue taking Fioricet going forward.  She said after looking at the side effects of the Elyxyb, she does not want to take that.  [facility] [place]

## 2021-01-20 NOTE — Telephone Encounter (Signed)
LMOVM, Which medication is the pt taking. Received a Refill request for  Fioricet. Per last ov note pt taking Elyxyb.  Please give [place] a call back.

## 2021-03-19 ENCOUNTER — Other Ambulatory Visit: Payer: Self-pay | Admitting: Neurology

## 2021-03-24 ENCOUNTER — Other Ambulatory Visit: Payer: Self-pay | Admitting: Family Medicine

## 2021-04-30 ENCOUNTER — Telehealth: Payer: Self-pay | Admitting: Neurology

## 2021-04-30 NOTE — Telephone Encounter (Signed)
Pt called in and left a message. She stated her migraines have gotten much worse the last month or 2 and are pretty much all the time now. She is also having the same stroke signs that she did before with the migraines. She would like to revisit Botox.

## 2021-05-01 ENCOUNTER — Other Ambulatory Visit: Payer: Self-pay | Admitting: Neurology

## 2021-05-01 MED ORDER — VENLAFAXINE HCL ER 37.5 MG PO CP24: 112.5000 mg | ORAL_CAPSULE | Freq: Every day | ORAL | 5 refills | Status: DC

## 2021-05-01 NOTE — Telephone Encounter (Signed)
If the PA approval is still valid, we can schedule her.  Otherwise, we will need to send a new order but uncertain if it will be approved without a repeat evaluation/office visit first.  In the meantime, we can increase venlafaxine XR to 112.92m daily (take three 37.5102mcapsules daily).    Pt would like to schedule a visit to discuss botox. Ad add her new insurance.    Pt agree to increase the venlafaxine to 112.5 mg

## 2021-05-01 NOTE — Telephone Encounter (Signed)
[facility]
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
## 2021-05-06 ENCOUNTER — Other Ambulatory Visit: Payer: Self-pay

## 2021-05-06 ENCOUNTER — Encounter: Payer: Self-pay | Admitting: Neurology

## 2021-05-06 ENCOUNTER — Ambulatory Visit (INDEPENDENT_AMBULATORY_CARE_PROVIDER_SITE_OTHER): Payer: BC Managed Care – PPO | Admitting: Neurology

## 2021-05-06 VITALS — BP 136/87 | HR 80 | Ht 64.0 in | Wt 153.2 lb

## 2021-05-06 DIAGNOSIS — G43709 Chronic migraine without aura, not intractable, without status migrainosus: Secondary | ICD-10-CM | POA: Diagnosis not present

## 2021-05-06 DIAGNOSIS — G43109 Migraine with aura, not intractable, without status migrainosus: Secondary | ICD-10-CM | POA: Diagnosis not present

## 2021-05-06 MED ORDER — VENLAFAXINE HCL ER 150 MG PO CP24: 150.0000 mg | ORAL_CAPSULE | Freq: Every day | ORAL | 5 refills | Status: DC

## 2021-05-06 MED ORDER — TRAMADOL HCL 50 MG PO TABS: 50.0000 mg | ORAL_TABLET | Freq: Four times a day (QID) | ORAL | 5 refills | Status: DC | PRN

## 2021-05-06 NOTE — Patient Instructions (Signed)
Will start Botox Increase venlafaxine XR to 133m daily When you get a migraine, take tramadol 578mwith acetaminophen 65010m May repeat in 6 hours.  May still use Fioricet but remember that you only get 2 days out of week to take a pain reliever Keep headache diary Follow up for Botox Look into the Cefaly device.

## 2021-05-19 ENCOUNTER — Other Ambulatory Visit: Payer: Self-pay | Admitting: Family Medicine

## 2021-05-19 DIAGNOSIS — Z1231 Encounter for screening mammogram for malignant neoplasm of breast: Secondary | ICD-10-CM

## 2021-05-23 ENCOUNTER — Other Ambulatory Visit: Payer: Self-pay | Admitting: Neurology

## 2021-05-24 ENCOUNTER — Other Ambulatory Visit: Payer: Self-pay | Admitting: Neurology

## 2021-05-27 ENCOUNTER — Ambulatory Visit: Payer: BC Managed Care – PPO | Admitting: Neurology

## 2021-05-29 ENCOUNTER — Telehealth: Payer: Self-pay | Admitting: Neurology

## 2021-05-29 NOTE — Telephone Encounter (Signed)
LMOVM script faxed over to the pharmacy 05/28/21. Script may have been denied to the script was refilled and faxed over.

## 2021-05-30 ENCOUNTER — Telehealth: Payer: Self-pay

## 2021-05-30 NOTE — Telephone Encounter (Signed)
New message   SHABNAM LADD Key: [id number] help? Call us at [phone]  Status Sent to Plan today  Drug Botox 200UNIT solution  Form [employer]  Commercial Medical Benefit Electronic Request Form (CB)

## 2021-06-02 ENCOUNTER — Other Ambulatory Visit: Payer: Self-pay | Admitting: Neurology

## 2021-06-02 NOTE — Telephone Encounter (Signed)
F/u   Resubmit Botox claim     Outcome Denied on September 23  Drug Botox 200UNIT solution  Form [employer] [place] [employer] Benefit Electronic Request Form (CB)

## 2021-06-02 NOTE — Telephone Encounter (Signed)
F/U   [facility]otox[facility] benefit verification    ? Hoffman, Makaylie 06/02/2021 [id number] BV Full  [facility] Metta Clines In Progress None

## 2021-06-06 NOTE — Telephone Encounter (Signed)
F/u   The patient has an upcoming appointment for Botox injection on 06/13/21 with Dr. Tomi Likens.  Receive fax notification from [facility].  [facility] submitted the Prior Authorization to [facility] on 06/03/21.   [facility] contacted [employer] for a status update on 06/05/21 but was unsuccessful.    The patient was informed of [facility]otox[facility]'s additional information and [place] denial letter.    Call [employer] this morning per representative, and appeal is the next step appeal form will be faxed over to the office at [phone] the appeals process takes up to 30 days we will need to mark the appeal as an urgent request.

## 2021-06-06 NOTE — Telephone Encounter (Signed)
Pt advised what we can try,  Please contact patient to let her know we need to try another anti-CGRP injection.  We would need to start Emgality - the first dose requires 2 injections but then 1 injection every 28 days thereafter  2 prescriptions:  Emgality 220m once.  Loading Dose.  Quantity 2 mL, Refill 0  Emgality 1246mevery 28 days.  Quantity 1 mL.  Refills 5.  Per pt she tried calling [employer]BS she was advised that she did not have enough headaches in month for them to cover the Botox. Which is different information to the PA coordinator.  Pt states she do not feel comfortable taking the Emgality which is the same class as the Aimovig - Pt had a skin reaction to after injecting the medication, See Last ov notes.  Pt c/o the headache getting worse it has not went away since her last ov. She has tried the Venlafaxine 150 mg, as will as Fioricet as directed, tramadol will help some but not it away completely.   Pt will wait til the 30 days for the Appeal for Botox   / [facility] could not obtained authorization from [place]. [place] denied her botox due to failed therapy with a CGRP such as Emgality or Aimovig . tried  call this morning they were closed, will call them back. Wanted to give you a heads up

## 2021-06-11 NOTE — Telephone Encounter (Signed)
F/U   The patient came by the office to sign the BCBS appeal form.   BCBS form appeal department level 1  Fax # [phone]

## 2021-06-12 ENCOUNTER — Encounter: Payer: 59 | Admitting: Family Medicine

## 2021-06-13 ENCOUNTER — Ambulatory Visit: Payer: BC Managed Care – PPO | Admitting: Neurology

## 2021-06-18 ENCOUNTER — Other Ambulatory Visit: Payer: Self-pay

## 2021-06-18 MED ORDER — VENLAFAXINE HCL 37.5 MG PO TABS: 37.5000 mg | ORAL_TABLET | Freq: Every day | ORAL | 0 refills | Status: DC

## 2021-06-18 NOTE — Progress Notes (Signed)
Per Dr.Jaffe, At this point, I would recommend starting another CGRP inhibitor.  Aimovig is a CGRP inhibitor, but just because she had side effects to Aimovig does not mean she will with the other options.  We can try a different injection (Emgality or Ajovy).  There is also a daily pill called Lenoria Chime (which we would send to a mail order speciality pharmacy).   I would continue the Effexor 34m daily for another week.  Please send a 14 day supply of venlafaxine XR 37.5100mdaily (no refills)  - after she has been on 7583maily for a week, she should switch to the 37.5mg82mpsule and take for 2 weeks and then stop.

## 2021-06-19 ENCOUNTER — Other Ambulatory Visit: Payer: Self-pay | Admitting: Neurology

## 2021-06-19 MED ORDER — QULIPTA 60 MG PO TABS: 60.0000 mg | ORAL_TABLET | Freq: Every day | ORAL | 5 refills | Status: DC

## 2021-06-23 ENCOUNTER — Other Ambulatory Visit: Payer: Self-pay

## 2021-06-23 ENCOUNTER — Ambulatory Visit
Admission: RE | Admit: 2021-06-23 | Discharge: 2021-06-23 | Disposition: A | Discharge status: Home / Self Care | Payer: BC Managed Care – PPO | Source: Ambulatory Visit | Attending: Family Medicine | Admitting: Family Medicine

## 2021-06-23 DIAGNOSIS — Z1231 Encounter for screening mammogram for malignant neoplasm of breast: Secondary | ICD-10-CM

## 2021-06-27 ENCOUNTER — Other Ambulatory Visit: Payer: Self-pay | Admitting: Neurology

## 2021-07-02 NOTE — Telephone Encounter (Signed)
F/u   Member appeal form fax to  [phone] on 10.25.22 & 10.26.22

## 2021-07-09 ENCOUNTER — Telehealth: Payer: Self-pay | Admitting: Neurology

## 2021-07-09 MED ORDER — ONDANSETRON 8 MG PO TBDP: 8.0000 mg | ORAL_TABLET | Freq: Three times a day (TID) | ORAL | 3 refills | Status: DC | PRN

## 2021-07-09 NOTE — Telephone Encounter (Signed)
Pt advised of Dr.Jaffe note, I would really like her to give it another 2 weeks.  If no improvement in 2 weeks, then we definitely should change management.  For nausea, please send new prescription for Zofran ODT, increasing to 47m tablet.    Zofran 8 mg sent to the pharmacy.

## 2021-07-09 NOTE — Telephone Encounter (Signed)
Patient called and said she is not doing and better with her migraines and she is having trouble with nausea and eating. How long for Tami Mcdonald to work?

## 2021-07-15 ENCOUNTER — Encounter: Payer: Self-pay | Admitting: Family Medicine

## 2021-07-15 NOTE — Telephone Encounter (Signed)
On Friday, November 4th @ 7:44 am call was transferred over I spoke with the patient concerning her prior authorization and advised the patient I would be calling BCBS for a status update.   I called the phone number listed at [place] [facility]. [phone] and left a detailed message due to the high call volume.   I called @ 4:20 pm today [phone] and spoke with Tami Mcdonald appeal request was received on 07/01/21, Tami Mcdonald at [facility] advised me to resend the fax to [phone] over with the patient's signature and supporting documentation.

## 2021-07-15 NOTE — Telephone Encounter (Signed)
F/u   On Friday, November 4th @ 7:44 am call was transferred over I spoke with the patient concerning her prior authorization and advised the patient I would be calling BCBS for a status update.   I called the phone number listed at [place] and leave a detailed message due to the high call volume.   I called @ 4:20 pm to [phone] and spoke with Elmyra Ricks appeal request was received on 07/01/21, Elmyra Ricks at [facility] advised me to resend the fax to [phone] over with the patient's signature and supporting documentation.

## 2021-07-16 ENCOUNTER — Other Ambulatory Visit: Payer: Self-pay | Admitting: Family Medicine

## 2021-07-16 MED ORDER — FLUTICASONE PROPIONATE HFA 110 MCG/ACT IN AERO: 1.0000 | INHALATION_SPRAY | Freq: Two times a day (BID) | RESPIRATORY_TRACT | 5 refills | Status: DC

## 2021-07-16 NOTE — Telephone Encounter (Signed)
F/u   I called BCBS and spoke with Elmyra Ricks to request a new appeal form to [phone].    I called and left a VM on the patient's cell phone [phone].  I've spoken to someone at [facility] requesting an appeal form to be sent over. I will call the patient back when I have the forms in the office for her to sign.

## 2021-08-04 NOTE — Telephone Encounter (Signed)
F/u   Call [place] at [phone] spoke with Stacy H.   Fax received on 07/28/21   BCBS # [id number].   The process will take up to 30 days or less a letter will be mailed out before Dec 21st.   BCBS has all of the information from the office needed in the appeal.   Per [place] Board, if the appeal is denied turnaround time to appeal again will be 180 days.    I called the patient at  [phone] and left a detailed message on the voicemail informing her that I would be informing Dr. Tomi Likens / CMA of today's phone call with [place].

## 2021-08-11 ENCOUNTER — Encounter: Payer: Self-pay | Admitting: Family Medicine

## 2021-08-11 ENCOUNTER — Ambulatory Visit (INDEPENDENT_AMBULATORY_CARE_PROVIDER_SITE_OTHER): Payer: BC Managed Care – PPO | Admitting: Family Medicine

## 2021-08-11 VITALS — BP 112/68 | HR 78 | Temp 98.1°F | Resp 16 | Ht 64.0 in | Wt 151.2 lb

## 2021-08-11 DIAGNOSIS — Z0000 Encounter for general adult medical examination without abnormal findings: Secondary | ICD-10-CM | POA: Diagnosis not present

## 2021-08-11 DIAGNOSIS — J3489 Other specified disorders of nose and nasal sinuses: Secondary | ICD-10-CM

## 2021-08-11 DIAGNOSIS — G4700 Insomnia, unspecified: Secondary | ICD-10-CM | POA: Insufficient documentation

## 2021-08-11 DIAGNOSIS — Z203 Contact with and (suspected) exposure to rabies: Secondary | ICD-10-CM | POA: Diagnosis not present

## 2021-08-11 DIAGNOSIS — K219 Gastro-esophageal reflux disease without esophagitis: Secondary | ICD-10-CM

## 2021-08-11 DIAGNOSIS — R21 Rash and other nonspecific skin eruption: Secondary | ICD-10-CM | POA: Diagnosis not present

## 2021-08-11 DIAGNOSIS — M2550 Pain in unspecified joint: Secondary | ICD-10-CM

## 2021-08-11 DIAGNOSIS — E559 Vitamin D deficiency, unspecified: Secondary | ICD-10-CM

## 2021-08-11 DIAGNOSIS — G43009 Migraine without aura, not intractable, without status migrainosus: Secondary | ICD-10-CM | POA: Diagnosis not present

## 2021-08-11 DIAGNOSIS — J45909 Unspecified asthma, uncomplicated: Secondary | ICD-10-CM

## 2021-08-11 LAB — CBC WITH DIFFERENTIAL/PLATELET
Basophils Absolute: 0 10*3/uL (ref 0.0–0.1)
Basophils Relative: 1.2 % (ref 0.0–3.0)
Eosinophils Absolute: 0.1 10*3/uL (ref 0.0–0.7)
Eosinophils Relative: 2.4 % (ref 0.0–5.0)
HCT: 36.8 % (ref 36.0–46.0)
Hemoglobin: 12.2 g/dL (ref 12.0–15.0)
Lymphocytes Relative: 36.8 % (ref 12.0–46.0)
Lymphs Abs: 1.5 10*3/uL (ref 0.7–4.0)
MCHC: 33.1 g/dL (ref 30.0–36.0)
MCV: 85.2 fl (ref 78.0–100.0)
Monocytes Absolute: 0.3 10*3/uL (ref 0.1–1.0)
Monocytes Relative: 8.2 % (ref 3.0–12.0)
Neutro Abs: 2 10*3/uL (ref 1.4–7.7)
Neutrophils Relative %: 51.4 % (ref 43.0–77.0)
Platelets: 351 10*3/uL (ref 150.0–400.0)
RBC: 4.32 Mil/uL (ref 3.87–5.11)
RDW: 17.2 % — ABNORMAL HIGH (ref 11.5–15.5)
WBC: 4 10*3/uL (ref 4.0–10.5)

## 2021-08-11 LAB — COMPREHENSIVE METABOLIC PANEL
ALT: 11 U/L (ref 0–35)
AST: 15 U/L (ref 0–37)
Albumin: 4.4 g/dL (ref 3.5–5.2)
Alkaline Phosphatase: 59 U/L (ref 39–117)
BUN: 9 mg/dL (ref 6–23)
CO2: 30 mEq/L (ref 19–32)
Calcium: 9.7 mg/dL (ref 8.4–10.5)
Chloride: 103 mEq/L (ref 96–112)
Creatinine, Ser: 0.73 mg/dL (ref 0.40–1.20)
GFR: 98.3 mL/min (ref >60.00)
Glucose, Bld: 89 mg/dL (ref 70–99)
Potassium: 4.4 mEq/L (ref 3.5–5.1)
Sodium: 140 mEq/L (ref 135–145)
Total Bilirubin: 0.4 mg/dL (ref 0.2–1.2)
Total Protein: 6.8 g/dL (ref 6.0–8.3)

## 2021-08-11 LAB — LIPID PANEL
Cholesterol: 212 mg/dL — ABNORMAL HIGH (ref 0–200)
HDL: 80 mg/dL (ref >39.00)
LDL Cholesterol: 114 mg/dL — ABNORMAL HIGH (ref 0–99)
NonHDL: 131.68
Total CHOL/HDL Ratio: 3
Triglycerides: 87 mg/dL (ref 0.0–149.0)
VLDL: 17.4 mg/dL (ref 0.0–40.0)

## 2021-08-11 LAB — SEDIMENTATION RATE: Sed Rate: 1 mm/hr (ref 0–20)

## 2021-08-11 LAB — TSH: TSH: 2.19 u[IU]/mL (ref 0.35–5.50)

## 2021-08-11 LAB — HIGH SENSITIVITY CRP: CRP, High Sensitivity: 1.13 mg/L (ref 0.000–5.000)

## 2021-08-11 NOTE — Assessment & Plan Note (Signed)
Supplement and monitor 

## 2021-08-11 NOTE — Assessment & Plan Note (Signed)
No recent exacerbations, no changes

## 2021-08-11 NOTE — Assessment & Plan Note (Signed)
Gets a pustule roughly once a month that respond to Mupirocin has not gone to ENT but will worse if worsens.

## 2021-08-11 NOTE — Patient Instructions (Addendum)
Magnesium Glycinate 200-400 mg daily L Tryptophan capsule  Melatonin 2-5 mg at bedtime  Tami Mcdonald  MIndBodyGreen Sleep Support  Benadryl or Unisom  Encouraged good sleep hygiene such as dark, quiet room. No blue/green glowing lights such as computer screens in bedroom. No alcohol or stimulants in evening. Cut down on caffeine as able. Regular exercise is helpful but not just prior to bed time.     Get [place] a copy of your healthcare power of attorney and living will please  Preventive Care 63-5 Years Old, Female Preventive care refers to lifestyle choices and visits with your health care provider that can promote health and wellness. Preventive care visits are also called wellness exams. What can I expect for my preventive care visit? Counseling Your health care provider may ask you questions about your: Medical history, including: Past medical problems. Family medical history. Pregnancy history. Current health, including: Menstrual cycle. Method of birth control. Emotional well-being. Home life and relationship well-being. Sexual activity and sexual health. Lifestyle, including: Alcohol, nicotine or tobacco, and drug use. Access to firearms. Diet, exercise, and sleep habits. Work and work Statistician. Sunscreen use. Safety issues such as seatbelt and bike helmet use. Physical exam Your health care provider will check your: Height and weight. These may be used to calculate your BMI (body mass index). BMI is a measurement that tells if you are at a healthy weight. Waist circumference. This measures the distance around your waistline. This measurement also tells if you are at a healthy weight and may help predict your risk of certain diseases, such as type 2 diabetes and high blood pressure. Heart rate and blood pressure. Body temperature. Skin for abnormal spots. What immunizations do I need? Vaccines are usually given at various ages, according to a  schedule. Your health care provider will recommend vaccines for you based on your age, medical history, and lifestyle or other factors, such as travel or where you work. What tests do I need? Screening Your health care provider may recommend screening tests for certain conditions. This may include: Lipid and cholesterol levels. Diabetes screening. This is done by checking your blood sugar (glucose) after you have not eaten for a while (fasting). Pelvic exam and Pap test. Hepatitis B test. Hepatitis C test. HIV (human immunodeficiency virus) test. STI (sexually transmitted infection) testing, if you are at risk. Lung cancer screening. Colorectal cancer screening. Mammogram. Talk with your health care provider about when you should start having regular mammograms. This may depend on whether you have a family history of breast cancer. BRCA-related cancer screening. This may be done if you have a family history of breast, ovarian, tubal, or peritoneal cancers. Bone density scan. This is done to screen for osteoporosis. Talk with your health care provider about your test results, treatment options, and if necessary, the need for more tests. Follow these instructions at home: Eating and drinking  Eat a diet that includes fresh fruits and vegetables, whole grains, lean protein, and low-fat dairy products. Take vitamin and mineral supplements as recommended by your health care provider. Do not drink alcohol if: Your health care provider tells you not to drink. You are pregnant, may be pregnant, or are planning to become pregnant. If you drink alcohol: Limit how much you have to 0-1 drink a day. Know how much alcohol is in your drink. In the [place] one drink equals one 12 oz bottle of beer (355 mL), one 5 oz glass of wine (148 mL), or one  1 oz glass of hard liquor (44 mL). Lifestyle Brush your teeth every morning and night with fluoride toothpaste. Floss one time each day. Exercise for at least  30 minutes 5 or more days each week. Do not use any products that contain nicotine or tobacco. These products include cigarettes, chewing tobacco, and vaping devices, such as e-cigarettes. If you need help quitting, ask your health care provider. Do not use drugs. If you are sexually active, practice safe sex. Use a condom or other form of protection to prevent STIs. If you do not wish to become pregnant, use a form of birth control. If you plan to become pregnant, see your health care provider for a prepregnancy visit. Take aspirin only as told by your health care provider. Make sure that you understand how much to take and what form to take. Work with your health care provider to find out whether it is safe and beneficial for you to take aspirin daily. Find healthy ways to manage stress, such as: Meditation, yoga, or listening to music. Journaling. Talking to a trusted person. Spending time with friends and family. Minimize exposure to UV radiation to reduce your risk of skin cancer. Safety Always wear your seat belt while driving or riding in a vehicle. Do not drive: If you have been drinking alcohol. Do not ride with someone who has been drinking. When you are tired or distracted. While texting. If you have been using any mind-altering substances or drugs. Wear a helmet and other protective equipment during sports activities. If you have firearms in your house, make sure you follow all gun safety procedures. Seek help if you have been physically or sexually abused. What's next? Visit your health care provider once a year for an annual wellness visit. Ask your health care provider how often you should have your eyes and teeth checked. Stay up to date on all vaccines. This information is not intended to replace advice given to you by your health care provider. Make sure you discuss any questions you have with your health care provider. Document Revised: 02/19/2021 Document Reviewed:  02/19/2021 Elsevier Patient Education  [place].

## 2021-08-11 NOTE — Assessment & Plan Note (Signed)
Patient encouraged to maintain heart healthy diet, regular exercise, adequate sleep. Consider daily probiotics. Take medications as prescribed. Sees Dr Quincy Simmonds for pap and mgm results requested.

## 2021-08-11 NOTE — Assessment & Plan Note (Signed)
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

## 2021-08-11 NOTE — Assessment & Plan Note (Signed)
Encouraged increased hydration, 64 ounces of clear fluids daily. Minimize alcohol and caffeine. Eat small frequent meals with lean proteins and complex carbs. Avoid high and low blood sugars. Get adequate sleep, 7-8 hours a night. Needs exercise daily preferably in the morning but is following with neurology and is having a terrible time. She has tried ear piercing in July which gave her relief for 13 days but now headaches are daily again. She is now on Qulipta for a couple weeks and she is some better but she has also changed her diet and she is avoiding High Histamine diet and that has helped some. When she slips her headaches are worse. She take Butalbitol or Ultram infrequently with some results.
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
## 2021-08-11 NOTE — Assessment & Plan Note (Signed)
Magnesium Glycinate 200-400 mg daily L Tryptophan capsule  Melatonin 2-5 mg at bedtime  [facility] Sleep + medicine  MIndBodyGreen Sleep Support  Benadryl or Unisom  Encouraged good sleep hygiene such as dark, quiet room. No blue/green glowing lights such as computer screens in bedroom. No alcohol or stimulants in evening. Cut down on caffeine as able. Regular exercise is helpful but not just prior to bed time.

## 2021-08-11 NOTE — Assessment & Plan Note (Signed)
Avoid offending foods, start probiotics. Do not eat large meals in late evening and consider raising head of bed.  

## 2021-08-13 ENCOUNTER — Encounter: Payer: Self-pay | Admitting: Family Medicine

## 2021-08-19 ENCOUNTER — Other Ambulatory Visit: Payer: Self-pay | Admitting: Neurology

## 2021-08-19 MED ORDER — BUTALBITAL-ACETAMINOPHEN 50-325 MG PO TABS
ORAL_TABLET | ORAL | 5 refills | Status: DC
Start: 2021-08-19 — End: 2022-05-12

## 2021-08-21 NOTE — Telephone Encounter (Signed)
F/u   Received form from [employer] Number [id number]  I am writing to acknowledge receipt of an appeal by the member rights and appeal department on November 21,2022 , regarding a request for approval of Pharamcy not med Delma Post. This review could possible requires up to 30 days to complete from the date your request or authorization was received.   Contact person Henry Russel at  [phone]   I called the patient at  [phone] and read the letter from [facility] on voicemail. Also, informing the patient that I left a message for Colletta [place] to call me at  [phone], her recording stated she was out of the office.

## 2021-08-22 ENCOUNTER — Other Ambulatory Visit: Payer: Self-pay

## 2021-08-22 ENCOUNTER — Other Ambulatory Visit (HOSPITAL_COMMUNITY): Payer: Self-pay

## 2021-08-22 LAB — RABIES VIRUS ANTIBODY TITER: Rabies Response: 3.1 IU/mL

## 2021-08-22 LAB — HISTAMINE REL. (CHRONIC URTICARIA): Histamine Release: <16 % (ref <16)

## 2021-08-22 MED ORDER — BOTOX 200 UNITS IJ SOLR: INTRAMUSCULAR | 4 refills | Status: DC

## 2021-08-22 MED ORDER — BOTOX 200 UNITS IJ SOLR
INTRAMUSCULAR | 4 refills | Status: DC
  Filled 2021-08-22: qty 1, 30d supply, fill #0

## 2021-08-22 NOTE — Addendum Note (Signed)
Addended by: Venetia Night on: 08/22/2021 11:28 AM   Modules accepted: Orders

## 2021-08-22 NOTE — Progress Notes (Signed)
Botox approved.
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
## 2021-08-26 ENCOUNTER — Ambulatory Visit: Payer: BC Managed Care – PPO | Admitting: Allergy

## 2021-08-26 ENCOUNTER — Other Ambulatory Visit: Payer: Self-pay

## 2021-08-26 ENCOUNTER — Encounter: Payer: Self-pay | Admitting: Allergy

## 2021-08-26 VITALS — BP 128/72 | HR 79 | Resp 12 | Ht 64.0 in | Wt 151.0 lb

## 2021-08-26 DIAGNOSIS — J3089 Other allergic rhinitis: Secondary | ICD-10-CM

## 2021-08-26 DIAGNOSIS — L2089 Other atopic dermatitis: Secondary | ICD-10-CM

## 2021-08-26 DIAGNOSIS — J4540 Moderate persistent asthma, uncomplicated: Secondary | ICD-10-CM

## 2021-08-26 DIAGNOSIS — T781XXA Other adverse food reactions, not elsewhere classified, initial encounter: Secondary | ICD-10-CM | POA: Diagnosis not present

## 2021-08-26 DIAGNOSIS — G43009 Migraine without aura, not intractable, without status migrainosus: Secondary | ICD-10-CM

## 2021-08-26 DIAGNOSIS — H1013 Acute atopic conjunctivitis, bilateral: Secondary | ICD-10-CM

## 2021-08-26 DIAGNOSIS — Z872 Personal history of diseases of the skin and subcutaneous tissue: Secondary | ICD-10-CM | POA: Diagnosis not present

## 2021-08-26 DIAGNOSIS — T781XXD Other adverse food reactions, not elsewhere classified, subsequent encounter: Secondary | ICD-10-CM | POA: Insufficient documentation

## 2021-08-26 MED ORDER — RYALTRIS 665-25 MCG/ACT NA SUSP: 1.0000 | Freq: Two times a day (BID) | NASAL | 5 refills | Status: DC

## 2021-08-26 MED ORDER — MONTELUKAST SODIUM 10 MG PO TABS: 10.0000 mg | ORAL_TABLET | Freq: Every day | ORAL | 5 refills | Status: DC

## 2021-08-26 NOTE — Assessment & Plan Note (Addendum)
Worsening migraines. Followed by neurology. Concerned for food triggers and histamine issues. Noted issues with dairy and gluten flaring her asthma.   Today's skin testing showed: Positive to grass, trees, mold, cat, dog, horse and dust mites. Borderline to mixed feathers, mouse, hamster, [place] pig, rabbit, gerbil and rat. Negative to select foods. Discussed with patient at length that skin prick testing and bloodwork (food IgE levels) check for IgE mediated reactions which her clinical presentation does not support.   Continue to follow up with neurology.   Keep journal.  Limit gluten, milk and kiwi intake.   See list regarding migraine food triggers and histamine rich foods.  o May try to do an elimination diet and see if symptoms improve.   Get bloodwork to rule out mastocytosis which is low on the differential diagnosis based on clinical symptoms.

## 2021-08-26 NOTE — Assessment & Plan Note (Signed)
As a child with no triggers.  Monitor symptoms.

## 2021-08-26 NOTE — Assessment & Plan Note (Signed)
Perennial rhinoconjunctivitis symptoms for 40+ years.  2003 skin testing showed multiple positives and was on AIT for 4 to 5 years with good benefit.  Patient is a Psychologist, clinical.  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  Today's skin testing showed: Positive to grass, trees, mold, cat, dog, horse and dust mites. Borderline to mixed feathers, mouse, hamster, [place] pig, rabbit, gerbil and rat.  Possible that environmental allergies are contributing to her migraines.   Start environmental control measures as below.  Start Singulair (montelukast) 85m daily at night.  Cautioned that in some children/adults can experience behavioral changes including hyperactivity, agitation, depression, sleep disturbances and suicidal ideations. These side effects are rare, but if you notice them you should notify me and discontinue Singulair (montelukast).  Use over the counter antihistamines such as Zyrtec (cetirizine), Claritin (loratadine), Allegra (fexofenadine), or Xyzal (levocetirizine) daily as needed. May take twice a day during allergy flares. May switch antihistamines every few months.  Start Ryaltris (olopatadine + mometasone nasal spray combination) 1-2 sprays per nostril twice a day. Sample given.  Nasal saline spray (i.e., Simply Saline) or nasal saline lavage (i.e., NeilMed) is recommended as needed and prior to medicated nasal sprays.  Consider allergy injections for long term control if above medications do not help the symptoms - handout given.   Let u[place]ore[place]now when ready to start.

## 2021-08-26 NOTE — Patient Instructions (Addendum)
Today's skin testing showed: Positive to grass, trees, mold, cat, dog, horse and dust mites. Borderline to mixed feathers, mouse, hamster, [place] pig, rabbit, gerbil and rat. Negative to select foods. Results given.   Migraines/headaches: Continue to follow up with your neurologist Keep journal. Limit gluten, milk and kiwi intake.  See list regarding migraine food triggers and histamine rich foods.  You may try to do an elimination diet.   Get bloodwork We are ordering labs, so please allow 1-2 weeks for the results to come back. With the newly implemented Cures Act, the labs might be visible to you at the same time that they become visible to me. However, I will not address the results until all of the results are back, so please be patient.  In the meantime, continue recommendations in your patient instructions, including avoidance measures (if applicable), until you hear from me.  Asthma: Daily controller medication(s): Flovent 159mg 1 puff twice a day with spacer and rinse mouth afterwards During upper respiratory infections/asthma flares:  Start Flovent 1114m 2 puffs twice a day for 1-2 weeks until your breathing symptoms return to baseline.  Pretreat with albuterol 2 puffs. May use albuterol rescue inhaler 2 puffs every 4 to 6 hours as needed for shortness of breath, chest tightness, coughing, and wheezing. May use albuterol rescue inhaler 2 puffs 5 to 15 minutes prior to strenuous physical activities. Monitor frequency of use.  Asthma control goals:  Full participation in all desired activities (may need albuterol before activity) Albuterol use two times or less a week on average (not counting use with activity) Cough interfering with sleep two times or less a month Oral steroids no more than once a year No hospitalizations   Environmental allergies Start environmental control measures as below. Start Singulair (montelukast) 1043maily at night. Cautioned that in some  children/adults can experience behavioral changes including hyperactivity, agitation, depression, sleep disturbances and suicidal ideations. These side effects are rare, but if you notice them you should notify me and discontinue Singulair (montelukast). Use over the counter antihistamines such as Zyrtec (cetirizine), Claritin (loratadine), Allegra (fexofenadine), or Xyzal (levocetirizine) daily as needed. May take twice a day during allergy flares. May switch antihistamines every few months. Start Ryaltris (olopatadine + mometasone nasal spray combination) 1-2 sprays per nostril twice a day. Sample given. Nasal saline spray (i.e., Simply Saline) or nasal saline lavage (i.e., NeilMed) is recommended as needed and prior to medicated nasal sprays.  Consider allergy injections for long term control if above medications do not help the symptoms - handout given.  Let us [place] when ready to start.  Follow up in 3 months or sooner if needed.    Reducing Pollen Exposure Pollen seasons: trees (spring), grass (summer) and ragweed/weeds (fall). Keep windows closed in your home and car to lower pollen exposure.  Install air conditioning in the bedroom and throughout the house if possible.  Avoid going out in dry windy days - especially early morning. Pollen counts are highest between 5 - 10 AM and on dry, hot and windy days.  Save outside activities for late afternoon or after a heavy rain, when pollen levels are lower.  Avoid mowing of grass if you have grass pollen allergy. Be aware that pollen can also be transported indoors on people and pets.  Dry your clothes in an automatic dryer rather than hanging them outside where they might collect pollen.  Rinse hair and eyes before bedtime. Mold Control Mold and fungi can grow on a variety  of surfaces provided certain temperature and moisture conditions exist.  Outdoor molds grow on plants, decaying vegetation and soil. The major outdoor mold, Alternaria and  Cladosporium, are found in very high numbers during hot and dry conditions. Generally, a late summer - fall peak is seen for common outdoor fungal spores. Rain will temporarily lower outdoor mold spore count, but counts rise rapidly when the rainy period ends. The most important indoor molds are Aspergillus and Penicillium. Dark, humid and poorly ventilated basements are ideal sites for mold growth. The next most common sites of mold growth are the bathroom and the kitchen. Outdoor (Seasonal) Mold Control Use air conditioning and keep windows closed. Avoid exposure to decaying vegetation. Avoid leaf raking. Avoid grain handling. Consider wearing a face mask if working in moldy areas.  Indoor (Perennial) Mold Control  Maintain humidity below 50%. Get rid of mold growth on hard surfaces with water, detergent and, if necessary, 5% bleach (do not mix with other cleaners). Then dry the area completely. If mold covers an area more than 10 square feet, consider hiring an indoor environmental professional. For clothing, washing with soap and water is best. If moldy items cannot be cleaned and dried, throw them away. Remove sources e.g. contaminated carpets. Repair and seal leaking roofs or pipes. Using dehumidifiers in damp basements may be helpful, but empty the water and clean units regularly to prevent mildew from forming. All rooms, especially basements, bathrooms and kitchens, require ventilation and cleaning to deter mold and mildew growth. Avoid carpeting on concrete or damp floors, and storing items in damp areas. Control of House Dust Mite Allergen Dust mite allergens are a common trigger of allergy and asthma symptoms. While they can be found throughout the house, these microscopic creatures thrive in warm, humid environments such as bedding, upholstered furniture and carpeting. Because so much time is spent in the bedroom, it is essential to reduce mite levels there.  Encase pillows, mattresses,  and box springs in special allergen-proof fabric covers or airtight, zippered plastic covers.  Bedding should be washed weekly in hot water (130 F) and dried in a hot dryer. Allergen-proof covers are available for comforters and pillows that cant be regularly washed.  Wash the allergy-proof covers every few months. Minimize clutter in the bedroom. Keep pets out of the bedroom.  Keep humidity less than 50% by using a dehumidifier or air conditioning. You can buy a humidity measuring device called a hygrometer to monitor this.  If possible, replace carpets with hardwood, linoleum, or washable area rugs. If that's not possible, vacuum frequently with a vacuum that has a HEPA filter. Remove all upholstered furniture and non-washable window drapes from the bedroom. Remove all non-washable stuffed toys from the bedroom.  Wash stuffed toys weekly. Pet Allergen Avoidance: Contrary to popular opinion, there are no hypoallergenic breeds of dogs or cats. That is because people are not allergic to an animals hair, but to an allergen found in the animal's saliva, dander (dead skin flakes) or urine. Pet allergy symptoms typically occur within minutes. For some people, symptoms can build up and become most severe 8 to 12 hours after contact with the animal. People with severe allergies can experience reactions in public places if dander has been transported on the pet owners clothing. Keeping an animal outdoors is only a partial solution, since homes with pets in the yard still have higher concentrations of animal allergens. Before getting a pet, ask your allergist to determine if you are allergic to animals.  If your pet is already considered part of your family, try to minimize contact and keep the pet out of the bedroom and other rooms where you spend a great deal of time. As with dust mites, vacuum carpets often or replace carpet with a hardwood floor, tile or linoleum. High-efficiency particulate air (HEPA)  cleaners can reduce allergen levels over time. While dander and saliva are the source of cat and dog allergens, urine is the source of allergens from rabbits, hamsters, mice and [place] pigs; so ask a non-allergic family member to clean the animals cage. If you have a pet allergy, talk to your allergist about the potential for allergy immunotherapy (allergy shots). This strategy can often provide long-term relief.

## 2021-08-26 NOTE — Assessment & Plan Note (Signed)
Diagnosed with asthma 4 years ago.  Takes Flovent 110 1 puff twice a day and albuterol rare occasions with good benefit.  No prior COVID-19 infection.  Today's spirometry was normal with no improvement in FEV1 post bronchodilator treatment.  Clinically feeling improved.  Daily controller medication(s): Flovent 171mg 1 puff twice a day with spacer and rinse mouth afterwards  During upper respiratory infections/asthma flares:  o Start Flovent 1162m 2 puffs twice a day for 1-2 weeks until your breathing symptoms return to baseline.  o Pretreat with albuterol 2 puffs.  May use albuterol rescue inhaler 2 puffs every 4 to 6 hours as needed for shortness of breath, chest tightness, coughing, and wheezing. May use albuterol rescue inhaler 2 puffs 5 to 15 minutes prior to strenuous physical activities. Monitor frequency of use.   Get spirometry at next visit.

## 2021-08-26 NOTE — Assessment & Plan Note (Signed)
On the hands.  Continue proper skin care.

## 2021-08-26 NOTE — Assessment & Plan Note (Signed)
.   See assessment and plan as above. 

## 2021-08-27 DIAGNOSIS — Z872 Personal history of diseases of the skin and subcutaneous tissue: Secondary | ICD-10-CM | POA: Diagnosis not present

## 2021-08-28 ENCOUNTER — Telehealth: Payer: Self-pay | Admitting: Neurology

## 2021-08-28 NOTE — Telephone Encounter (Signed)
Patient stated she talked with her pharmacy regarding Botox. They took her payment and scheduled a delivery for 09/05/21. The pharmacy seemed unclear, so she wants to know if this is okay. Is 12/30 a good delivery date?

## 2021-09-05 DIAGNOSIS — G43709 Chronic migraine without aura, not intractable, without status migrainosus: Secondary | ICD-10-CM | POA: Diagnosis not present

## 2021-09-11 LAB — ALPHA-GAL PANEL
Allergen Lamb IgE: <0.1 kU/L
Beef IgE: <0.1 kU/L
IgE (Immunoglobulin E), Serum: 78 IU/mL (ref 6–495)
O215-IgE Alpha-Gal: <0.1 kU/L
Pork IgE: 0.17 kU/L — AB

## 2021-09-11 LAB — ANA W/REFLEX: Anti Nuclear Antibody (ANA): NEGATIVE

## 2021-09-11 LAB — CHRONIC URTICARIA: cu index: 2.1 (ref <10)

## 2021-09-11 LAB — TRYPTASE: Tryptase: 3.4 ug/L (ref 2.2–13.2)

## 2021-09-12 ENCOUNTER — Ambulatory Visit: Payer: BC Managed Care – PPO | Admitting: Neurology

## 2021-09-15 DIAGNOSIS — S39012A Strain of muscle, fascia and tendon of lower back, initial encounter: Secondary | ICD-10-CM | POA: Diagnosis not present

## 2021-09-16 ENCOUNTER — Encounter: Payer: Self-pay | Admitting: Allergy

## 2021-09-16 ENCOUNTER — Encounter: Payer: Self-pay | Admitting: Neurology

## 2021-09-22 ENCOUNTER — Other Ambulatory Visit: Payer: Self-pay

## 2021-09-22 ENCOUNTER — Ambulatory Visit: Payer: BC Managed Care – PPO | Admitting: Neurology

## 2021-09-22 DIAGNOSIS — G43709 Chronic migraine without aura, not intractable, without status migrainosus: Secondary | ICD-10-CM

## 2021-09-22 MED ORDER — ONABOTULINUMTOXINA 100 UNITS IJ SOLR
200.0000 [IU] | Freq: Once | INTRAMUSCULAR | Status: AC
  Administered 2021-09-22: 155 [IU] via INTRAMUSCULAR

## 2021-09-22 NOTE — Progress Notes (Signed)
[facility]   Procedure Note Botox  Attending: Dr. Metta Clines  Preoperative Diagnosis(es): Chronic migraine  Consent obtained from: The patient Benefits discussed included, but were not limited to decreased muscle tightness, increased joint range of motion, and decreased pain.  Risk discussed included, but were not limited pain and discomfort, bleeding, bruising, excessive weakness, venous thrombosis, muscle atrophy and dysphagia.  Anticipated outcomes of the procedure as well as he risks and benefits of the alternatives to the procedure, and the roles and tasks of the personnel to be involved, were discussed with the patient, and the patient consents to the procedure and agrees to proceed. A copy of the patient medication guide was given to the patient which explains the blackbox warning.  Patients identity and treatment sites confirmed Yes.  .  Details of Procedure: Skin was cleaned with alcohol. Prior to injection, the needle plunger was aspirated to make sure the needle was not within a blood vessel.  There was no blood retrieved on aspiration.    Following is a summary of the muscles injected  And the amount of Botulinum toxin used:  Dilution 200 units of Botox was reconstituted with 4 ml of preservative free normal saline. Time of reconstitution: At the time of the office visit (<30 minutes prior to injection)   Injections  155 total units of Botox was injected with a 30 gauge needle.  Injection Sites: L occipitalis: 15 units- 3 sites  R occiptalis: 15 units- 3 sites  L upper trapezius: 15 units- 3 sites R upper trapezius: 15 units- 3 sits          L paraspinal: 10 units- 2 sites R paraspinal: 10 units- 2 sites  Face L frontalis(2 injection sites):10 units   R frontalis(2 injection sites):10 units         L corrugator: 5 units   R corrugator: 5 units           Procerus: 5 units   L temporalis: 20 units R temporalis: 20 units   Agent:  200 units of botulinum Type  A (Onobotulinum Toxin type A) was reconstituted with 4 ml of preservative free normal saline.  Time of reconstitution: At the time of the office visit (<30 minutes prior to injection)     Total injected (Units):  155  Total wasted (Units):  45  Patient tolerated procedure well without complications.   Reinjection is anticipated in 3 months.

## 2021-09-23 DIAGNOSIS — M545 Low back pain, unspecified: Secondary | ICD-10-CM | POA: Diagnosis not present
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
## 2021-09-29 DIAGNOSIS — M5416 Radiculopathy, lumbar region: Secondary | ICD-10-CM | POA: Diagnosis not present

## 2021-09-30 ENCOUNTER — Ambulatory Visit: Payer: BC Managed Care – PPO | Admitting: Family Medicine

## 2021-10-13 DIAGNOSIS — M545 Low back pain, unspecified: Secondary | ICD-10-CM | POA: Diagnosis not present

## 2021-10-24 ENCOUNTER — Encounter: Payer: Self-pay | Admitting: Neurology

## 2021-11-03 ENCOUNTER — Other Ambulatory Visit: Payer: Self-pay | Admitting: Neurology

## 2021-11-03 DIAGNOSIS — G43709 Chronic migraine without aura, not intractable, without status migrainosus: Secondary | ICD-10-CM | POA: Diagnosis not present

## 2021-11-04 DIAGNOSIS — M545 Low back pain, unspecified: Secondary | ICD-10-CM | POA: Diagnosis not present

## 2021-11-24 NOTE — Progress Notes (Signed)
? ?Follow Up Note ? ?RE: Tami Mcdonald MRN: [id number] DOB: 13-Oct-1975 ?Date of Office Visit: 11/25/2021 ? ?Referring provider: [name], MD ?Primary care provider: [name], MD ? ?Chief Complaint: Headache and Asthma (No issues ) ? ?History of Present Illness: ?I had the pleasure of seeing Tami Mcdonald for a follow up visit at the [facility] on 11/25/2021. She is a 46 y.o. female, who is being followed for allergic rhinoconjunctivitis, asthma, migraine, history of urticaria, atopic dermatitis. Her previous allergy office visit was on 08/26/2021 with Dr. Maudie Mercury. Today is a regular follow up visit. ? ?Migraine ?Started botox which helped for about 3 weeks. ?Tried limiting some migraine trigger foods with unknown benefit.  ?Eats pork rarely with no issues.  ?  ?Allergic rhino conjunctivitis ?Stopped Singulair last week due to anxiety and mood changes. The Singulair did help with making her less itchy.  ? ?Still doing Flonase and did not try Ryaltris. ?Currently taking Claritin one a day. ? ?Patient is not sure if she has time to commit to the injections at this time. Not interested in [facility] as she had localized reactions to the injections in the past and has some anxiety around the injections as well.  ? ?Asthma ?Currently on Flovent 137mg 1 puff BID and not needed to use albuterol but noticed that she is not taking as deep of a breath. ? ?Able to exercise with no issues.  ?Denies any SOB, coughing, wheezing, chest tightness, nocturnal awakenings, ER/urgent care visits or prednisone use since the last visit. ?  ?Other atopic dermatitis ?Dry cracked skin on the hands.  ? ?Assessment and Plan: ?CKateshais a 46y.o. female with: ?Asthma ?Past history - Diagnosed with asthma 4 years ago.  Takes Flovent 110 1 puff twice a day and albuterol rare occasions with good benefit.  No prior COVID-19 infection. 2022 spirometry was normal with no improvement in FEV1 post bronchodilator  treatment.  Clinically feeling improved. ?Interim history - stable but noted some inability to take a deep enough breath at times.  ?Today's spirometry was normal.  ?Daily controller medication(s): INCREASE Flovent 1146m to 2 puffs twice a day with spacer and rinse mouth afterwards ?During upper respiratory infections/asthma flares:  ?Start Flovent 1105m4 puffs twice a day for 1-2 weeks until your breathing symptoms return to baseline.  ?Pretreat with albuterol 2 puffs. ?May use albuterol rescue inhaler 2 puffs every 4 to 6 hours as needed for shortness of breath, chest tightness, coughing, and wheezing. May use albuterol rescue inhaler 2 puffs 5 to 15 minutes prior to strenuous physical activities. Monitor frequency of use.  ?Get spirometry at next visit. ? ?Seasonal and perennial allergic rhinoconjunctivitis ?Past history - Perennial rhinoconjunctivitis symptoms for 40+ years.  2003 skin testing showed multiple positives and was on AIT for 4 to 5 years with good benefit.  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 2022 skin testing showed: Positive to grass, trees, mold, cat, dog, horse and dust mites. Borderline to mixed feathers, mouse, hamster, gu[place]g, rabbit, gerbil and rat. ?Interim history - Singulair helped with symptoms but caused mood changes. Not interested in rush AIT but has some anxiety regarding AIT in general. Did not try new nasal spray yet.  ?Continue environmental control measures.  ?Use over the counter antihistamines such as Zyrtec (cetirizine), Claritin (loratadine), Allegra (fexofenadine), or Xyzal (levocetirizine) daily as needed. May take twice a day during allergy flares. May  switch antihistamines every few months. ?Start Ryaltris (olopatadine + mometasone nasal spray combination) 1-2 sprays per nostril twice a day.  ?Nasal saline spray (i.e., Simply Saline) or nasal saline lavage (i.e., NeilMed) is recommended as needed and prior to medicated nasal sprays. ?Consider allergy  injections for long term control if above medications do not help the symptoms - handout given.  ?Let us know when ready to start. ? ?Dyshidrotic eczema ?On hands.  ?See below for proper skin care.  ?Try Epicerum twice a day - this is a moisturizer. Let me know if insurance won't cover it.  ?Use Eucrisa (crisaborole) 2% ointment twice a day on mild rash flares on the face and body. This is a non-steroid ointment. Sample given.  ? ?Migraine ?Past history - Worsening migraines. Followed by neurology. Concerned for food triggers and histamine issues. Noted issues with dairy and gluten flaring her asthma. 2022 skin testing showed: Negative to select foods. ?Interim history - botox gave 3 weeks of relief. Normal bloodwork. ?Continue to follow up with neurology.  ? ?Return in about 4 months (around 03/27/2022). ? ?Meds ordered this encounter  ?Medications  ? Dermatological Products, Misc. [facility]) lotion  ?  Sig: Moisturize twice a day on the hands.  ?  Dispense:  225 g  ?  Refill:  3  ?  [phone]  ? ?Lab Orders  ?No laboratory test(s) ordered today  ? ? ?Diagnostics: ?Spirometry:  ?Tracings reviewed. Her effort: Good reproducible efforts. ?FVC: 3.17L ?FEV1: 2.56L, 78% predicted ?FEV1/FVC ratio: 81% ?Interpretation: Spirometry consistent with normal pattern.  ?Please see scanned spirometry results for details. ? ?Medication List:  ?Current Outpatient Medications  ?Medication Sig Dispense Refill  ? ACETAMINOPHEN-BUTALBITAL 50-325 MG TABS Take 1 to 2 tablets by mouth every 6 hours as needed. 10 tablet 5  ? albuterol (VENTOLIN HFA) 108 (90 Base) MCG/ACT inhaler INHALE 2 PUFFS INTO THE LUNGS EVERY 6 (SIX) HOURS AS NEEDED FOR WHEEZING. 8.5 g 1  ? b complex vitamins tablet Take by mouth.    ? Botulinum Toxin Type A (BOTOX) 200 units SOLR Inject 155 units IM Into Multiple sites of the face, head and neck every 90 days 1 each 4  ? calcium-vitamin D (OSCAL WITH D) 500-200 MG-UNIT per tablet Take 1 tablet by mouth as directed.  Once a week    ? cholecalciferol (VITAMIN D3) 25 MCG (1000 UNIT) tablet Take 1,000 Units by mouth daily.    ? Dermatological Products, Misc. [facility]) lotion Moisturize twice a day on the hands. 225 g 3  ? fluticasone (FLOVENT HFA) 110 MCG/ACT inhaler Inhale 1 puff into the lungs in the morning and at bedtime. 1 each 5  ? Krill Oil CAPS Take by mouth. Mega red- 1-2 week    ? Lactobacillus Rhamnosus, GG, (CULTURELLE PO) Take by mouth.    ? Multiple Vitamin (MULTIVITAMIN) tablet Take 1 tablet by mouth daily. 2-3 week    ? mupirocin ointment (BACTROBAN) 2 % Place 1 application into the nose at bedtime as needed. 22 g 1  ? ondansetron (ZOFRAN-ODT) 8 MG disintegrating tablet Take 1 tablet (8 mg total) by mouth every 8 (eight) hours as needed for nausea or vomiting. 20 tablet 3  ? QULIPTA 60 MG TABS TAKE ONE TABLET BY MOUTH EVERY DAY 30 tablet 1  ? vitamin C (ASCORBIC ACID) 500 MG tablet Take 500 mg by mouth daily. Patient only takes occasionally    ? Loratadine (CLARITIN) 10 MG CAPS Claritin    ? Olopatadine-Mometasone (RYALTRIS) [id number]  MCG/ACT SUSP Place 1-2 sprays into the nose in the morning and at bedtime. (Patient not taking: Reported on 11/25/2021) 29 g 5  ? ?No current facility-administered medications for this visit.  ? ?Allergies: ?Allergies  ?Allergen Reactions  ? Nortriptyline Hypertension  ?  Palpitations, high blood pressure  ? Other Other (See Comments), Nausea And Vomiting and Rash  ?  Uncoded Allergy. Allergen: tape, tegaderm  ? Dairy Aid [Tilactase]   ?  DAIRY  ? Gluten Meal   ?  Gluten causes asthma attack and severe allergy symptoms  ? Kiwi Extract   ? Singulair [Montelukast]   ?  Mood changes  ? Vistaril [Hydroxyzine Hcl] Other (See Comments)  ?  ?reaction type  ? Aimovig [Erenumab-Aooe] Rash  ? Nurtec [Rimegepant Sulfate] Rash  ? Penicillins Rash  ? Sulfa Antibiotics Nausea And Vomiting and Rash  ? ?I reviewed her past medical history, social history, family history, and environmental history and no  significant changes have been reported from her previous visit. ? ?Review of Systems  ?Constitutional:  Negative for appetite change, chills, fever and unexpected weight change.  ?HENT:  Positive for

## 2021-11-25 ENCOUNTER — Other Ambulatory Visit: Payer: Self-pay

## 2021-11-25 ENCOUNTER — Ambulatory Visit: Payer: BC Managed Care – PPO | Admitting: Allergy

## 2021-11-25 ENCOUNTER — Encounter: Payer: Self-pay | Admitting: Allergy

## 2021-11-25 VITALS — BP 122/76 | HR 86 | Temp 98.0°F | Resp 18 | Ht 64.0 in | Wt 154.8 lb

## 2021-11-25 DIAGNOSIS — H1013 Acute atopic conjunctivitis, bilateral: Secondary | ICD-10-CM

## 2021-11-25 DIAGNOSIS — L301 Dyshidrosis [pompholyx]: Secondary | ICD-10-CM

## 2021-11-25 DIAGNOSIS — J302 Other seasonal allergic rhinitis: Secondary | ICD-10-CM | POA: Diagnosis not present

## 2021-11-25 DIAGNOSIS — G43009 Migraine without aura, not intractable, without status migrainosus: Secondary | ICD-10-CM

## 2021-11-25 DIAGNOSIS — J4540 Moderate persistent asthma, uncomplicated: Secondary | ICD-10-CM

## 2021-11-25 DIAGNOSIS — L2089 Other atopic dermatitis: Secondary | ICD-10-CM

## 2021-11-25 DIAGNOSIS — Z872 Personal history of diseases of the skin and subcutaneous tissue: Secondary | ICD-10-CM

## 2021-11-25 DIAGNOSIS — H1010 Acute atopic conjunctivitis, unspecified eye: Secondary | ICD-10-CM | POA: Insufficient documentation

## 2021-11-25 MED ORDER — EPICERAM EX EMUL: CUTANEOUS | 3 refills | Status: DC

## 2021-11-25 NOTE — Assessment & Plan Note (Signed)
Past history - Diagnosed with asthma 4 years ago.  Takes Flovent 110 1 puff twice a day and albuterol rare occasions with good benefit.  No prior COVID-19 infection. 2022 spirometry was normal with no improvement in FEV1 post bronchodilator treatment.  Clinically feeling improved. ?Interim history - stable but noted some inability to take a deep enough breath at times.  ?? Today's spirometry was normal.  ?? Daily controller medication(s): INCREASE Flovent 174mg to 2 puffs twice a day with spacer and rinse mouth afterwards ?? During upper respiratory infections/asthma flares:  ?o Start Flovent 1190m 4 puffs twice a day for 1-2 weeks until your breathing symptoms return to baseline.  ?o Pretreat with albuterol 2 puffs. ?? May use albuterol rescue inhaler 2 puffs every 4 to 6 hours as needed for shortness of breath, chest tightness, coughing, and wheezing. May use albuterol rescue inhaler 2 puffs 5 to 15 minutes prior to strenuous physical activities. Monitor frequency of use.  ?? Get spirometry at next visit. ?

## 2021-11-25 NOTE — Patient Instructions (Addendum)
Asthma: ?Daily controller medication(s): INCREASE Flovent 182mg 2 puffs twice a day with spacer and rinse mouth afterwards ?During upper respiratory infections/asthma flares:  ?Start Flovent 1140m 4 puffs twice a day for 1-2 weeks until your breathing symptoms return to baseline.  ?Pretreat with albuterol 2 puffs. ?May use albuterol rescue inhaler 2 puffs every 4 to 6 hours as needed for shortness of breath, chest tightness, coughing, and wheezing. May use albuterol rescue inhaler 2 puffs 5 to 15 minutes prior to strenuous physical activities. Monitor frequency of use.  ?Asthma control goals:  ?Full participation in all desired activities (may need albuterol before activity) ?Albuterol use two times or less a week on average (not counting use with activity) ?Cough interfering with sleep two times or less a month ?Oral steroids no more than once a year ?No hospitalizations  ? ?Environmental allergies ?2022 skin testing: Positive to grass, trees, mold, cat, dog, horse and dust mites. Borderline to mixed feathers, mouse, hamster, gu[place], rabbit, gerbil and rat. ?Continue environmental control measures. ?Use over the counter antihistamines such as Zyrtec (cetirizine), Claritin (loratadine), Allegra (fexofenadine), or Xyzal (levocetirizine) daily as needed. May take twice a day during allergy flares. May switch antihistamines every few months. ?Start Ryaltris (olopatadine + mometasone nasal spray combination) 1-2 sprays per nostril twice a day.  ?Nasal saline spray (i.e., Simply Saline) or nasal saline lavage (i.e., NeilMed) is recommended as needed and prior to medicated nasal sprays. ?Consider allergy injections for long term control if above medications do not help the symptoms - handout given.  ?Let us[place]oreanow when ready to start. ? ?Atopic dermatitis ?See below for proper skin care.  ?Try Epicerum twice a day - this is a moisturizer. Let me know if insurance won't cover it.  ?Use Eucrisa (crisaborole) 2% ointment  twice a day on mild rash flares on the face and body. This is a non-steroid ointment. Sample given.  ?If it burns, place the medication in the refrigerator.  ?Apply a thin layer of moisturizer and then apply the Eucrisa on top of it. ? ?Migraines/headaches: ?Continue to follow up with your neurologist ? ?Follow up in 4 months or sooner if needed.   ? ?Skin care recommendations ? ?Bath time: ?Always use lukewarm water. AVOID very hot or cold water. ?Keep bathing time to 5-10 minutes. ?Do NOT use bubble bath. ?Use a mild soap and use just enough to wash the dirty areas. ?Do NOT scrub skin vigorously.  ?After bathing, pat dry your skin with a towel. Do NOT rub or scrub the skin. ? ?Moisturizers and prescriptions:  ?ALWAYS apply moisturizers immediately after bathing (within 3 minutes). This helps to lock-in moisture. ?Use the moisturizer several times a day over the whole body. ?Good summer moisturizers include: Aveeno, CeraVe, Cetaphil. ?Good winter moisturizers include: Aquaphor, Vaseline, Cerave, Cetaphil, Eucerin, Vanicream. ?When using moisturizers along with medications, the moisturizer should be applied about one hour after applying the medication to prevent diluting effect of the medication or moisturize around where you applied the medications. When not using medications, the moisturizer can be continued twice daily as maintenance. ? ?Laundry and clothing: ?Avoid laundry products with added color or perfumes. ?Use unscented hypo-allergenic laundry products such as Tide free, Cheer free & gentle, and All free and clear.  ?If the skin still seems dry or sensitive, you can try double-rinsing the clothes. ?Avoid tight or scratchy clothing such as wool. ?Do not use fabric softeners or dyer sheets. ?

## 2021-11-25 NOTE — Assessment & Plan Note (Signed)
Past history - Perennial rhinoconjunctivitis symptoms for 40+ years.  2003 skin testing showed multiple positives and was on AIT for 4 to 5 years with good benefit.  Patient is a Psychologist, clinical.  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 2022 skin testing showed: Positive to grass, trees, mold, cat, dog, horse and dust mites. Borderline to mixed feathers, mouse, hamster, [place] pig, rabbit, gerbil and rat. ?Interim history - Singulair helped with symptoms but caused mood changes. Not interested in rush AIT but has some anxiety regarding AIT in general. Did not try new nasal spray yet.  ?? Continue environmental control measures.  ?? Use over the counter antihistamines such as Zyrtec (cetirizine), Claritin (loratadine), Allegra (fexofenadine), or Xyzal (levocetirizine) daily as needed. May take twice a day during allergy flares. May switch antihistamines every few months. ?? Start Ryaltris (olopatadine + mometasone nasal spray combination) 1-2 sprays per nostril twice a day.  ?? Nasal saline spray (i.e., Simply Saline) or nasal saline lavage (i.e., NeilMed) is recommended as needed and prior to medicated nasal sprays. ?? Consider allergy injections for long term control if above medications do not help the symptoms - handout given.  ?? Let us know when ready to start. ?

## 2021-11-25 NOTE — Assessment & Plan Note (Signed)
On hands.  ?? See below for proper skin care.  ?? Try Epicerum twice a day - this is a moisturizer. Let me know if insurance won't cover it.  ?? Use Eucrisa (crisaborole) 2% ointment twice a day on mild rash flares on the face and body. This is a non-steroid ointment. Sample given.  ?

## 2021-11-25 NOTE — Assessment & Plan Note (Signed)
Past history - Worsening migraines. Followed by neurology. Concerned for food triggers and histamine issues. Noted issues with dairy and gluten flaring her asthma. 2022 skin testing showed: Negative to select foods. ?Interim history - botox gave 3 weeks of relief. Normal bloodwork. ?? Continue to follow up with neurology.  ?

## 2021-12-16 ENCOUNTER — Other Ambulatory Visit (HOSPITAL_COMMUNITY)
Admission: RE | Admit: 2021-12-16 | Discharge: 2021-12-16 | Disposition: A | Discharge status: Home / Self Care | Payer: BC Managed Care – PPO | Source: Ambulatory Visit | Attending: Obstetrics and Gynecology | Admitting: Obstetrics and Gynecology

## 2021-12-16 ENCOUNTER — Ambulatory Visit (INDEPENDENT_AMBULATORY_CARE_PROVIDER_SITE_OTHER): Payer: BC Managed Care – PPO | Admitting: Obstetrics and Gynecology

## 2021-12-16 ENCOUNTER — Encounter: Payer: Self-pay | Admitting: Obstetrics and Gynecology

## 2021-12-16 VITALS — BP 122/78 | HR 80 | Ht 63.5 in | Wt 153.0 lb

## 2021-12-16 DIAGNOSIS — Z124 Encounter for screening for malignant neoplasm of cervix: Secondary | ICD-10-CM | POA: Insufficient documentation

## 2021-12-16 DIAGNOSIS — Z01419 Encounter for gynecological examination (general) (routine) without abnormal findings: Secondary | ICD-10-CM | POA: Diagnosis not present

## 2021-12-16 DIAGNOSIS — Z1211 Encounter for screening for malignant neoplasm of colon: Secondary | ICD-10-CM | POA: Diagnosis not present
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
## 2021-12-16 NOTE — Patient Instructions (Signed)

## 2021-12-18 LAB — CYTOLOGY - PAP
Comment: NEGATIVE
Diagnosis: REACTIVE
High risk HPV: NEGATIVE

## 2021-12-19 ENCOUNTER — Ambulatory Visit: Payer: BC Managed Care – PPO | Admitting: Neurology

## 2021-12-23 ENCOUNTER — Ambulatory Visit: Payer: BC Managed Care – PPO | Admitting: Family Medicine

## 2021-12-23 ENCOUNTER — Encounter: Payer: Self-pay | Admitting: Family Medicine

## 2021-12-23 VITALS — BP 120/75 | HR 86 | Temp 98.2°F | Ht 63.5 in | Wt 154.6 lb

## 2021-12-23 DIAGNOSIS — R051 Acute cough: Secondary | ICD-10-CM | POA: Diagnosis not present

## 2021-12-23 DIAGNOSIS — J4520 Mild intermittent asthma, uncomplicated: Secondary | ICD-10-CM | POA: Diagnosis not present

## 2021-12-23 MED ORDER — PREDNISONE 20 MG PO TABS: 40.0000 mg | ORAL_TABLET | Freq: Every day | ORAL | 0 refills | Status: AC

## 2021-12-23 MED ORDER — HYDROCOD POLI-CHLORPHE POLI ER 10-8 MG/5ML PO SUER: 5.0000 mL | Freq: Two times a day (BID) | ORAL | 0 refills | Status: AC | PRN

## 2021-12-23 MED ORDER — DOXYCYCLINE HYCLATE 100 MG PO TABS: 100.0000 mg | ORAL_TABLET | Freq: Two times a day (BID) | ORAL | 0 refills | Status: AC

## 2021-12-23 NOTE — Progress Notes (Signed)
? ?Acute Office Visit ? ?Subjective:  ? ?  ?Patient ID: Tami Mcdonald, female    DOB: [birth year], 46 y.o.   MRN: [id number] ? ?CC: cough, asthma  ? ? ?HPI ?Patient is in today for cough, asthma  ? ?Patient reports she started with viral symptoms little over a week ago which she thought was getting better over the weekend (2 to 3 days ago), however has gotten worse since then.  Reports she has a productive cough with thick green mucus, chest tightness, trouble taking a deep breath, needing her albuterol inhaler every 4-6 hours for the past few days.  States the other upper respiratory symptoms have mostly resolved other than some nasal congestion and drainage.  She has not had any blood in her sputum, chest pain, wheezing, fevers recently, body aches, chills, nausea, vomiting, diarrhea.  She took a negative COVID test today.  Reports history of asthma that will occasionally turn into bronchitis type symptoms requiring antibiotics.  Reports she is not sleeping well due to the cough, and her chest is sore from coughing.  She has been getting minimal relief with a variety of over-the-counter cough, cold, allergy medications and supportive measures. ? ? ? ?ROS ?All review of systems negative except what is listed in the HPI ? ? ?   ?Objective:  ?  ?BP 120/75   Pulse 86   Temp 98.2 ?F (36.8 ?C)   Ht 5' 3.5" (1.613 m)   Wt 154 lb 9.6 oz (70.1 kg)   LMP 11/25/2021 (Exact Date)   SpO2 99%   BMI 26.96 kg/m?  ? ? ?Physical Exam ?Vitals reviewed.  ?Constitutional:   ?   Appearance: Normal appearance. She is normal weight.  ?Cardiovascular:  ?   Rate and Rhythm: Normal rate and regular rhythm.  ?Pulmonary:  ?   Effort: Pulmonary effort is normal.  ?   Breath sounds: No rhonchi or rales.  ?   Comments: Occasional faint wheeze ?Musculoskeletal:  ?   Cervical back: Normal range of motion and neck supple.  ?Skin: ?   General: Skin is warm and dry.  ?Neurological:  ?   General: No focal deficit present.  ?   Mental  Status: She is alert and oriented to person, place, and time.  ?Psychiatric:     ?   Mood and Affect: Mood normal.     ?   Behavior: Behavior normal.     ?   Thought Content: Thought content normal.     ?   Judgment: Judgment normal.  ? ? ?No results found for any visits on 12/23/21. ? ? ?   ?Assessment & Plan:  ? ?1. Acute cough ?2. Mild intermittent asthma without complication ?Viral/allergic trigger of upper respiratory infection. Given duration and double sickening, treating with antibiotics. With history of asthma and frequent use of albuterol the past few days with faint wheeze on exam, prednisone sent in - if you prefer not to use, you can wait to see how you respond to the antibiotics first. Adding cough syrup for bedtime. Continue supportive measures including rest, hydration, humidifier use, steam showers, warm compresses to sinuses, warm liquids with lemon and honey, and over-the-counter cough, cold, and analgesics as needed.   ? ?- predniSONE (DELTASONE) 20 MG tablet; Take 2 tablets (40 mg total) by mouth daily with breakfast for 5 days.  Dispense: 10 tablet; Refill: 0 ?- doxycycline (VIBRA-TABS) 100 MG tablet; Take 1 tablet (100 mg total) by mouth 2 (two) times daily  for 7 days.  Dispense: 14 tablet; Refill: 0 ?- chlorpheniramine-HYDROcodone 10-8 MG/5ML; Take 5 mLs by mouth every 12 (twelve) hours as needed for up to 5 days for cough (cough, will cause drowsiness.).  Dispense: 50 mL; Refill: 0 ? ? ? ?Return if symptoms worsen or fail to improve. ? ?Terrilyn Saver, NP ? ? ?

## 2021-12-23 NOTE — Patient Instructions (Addendum)
Viral/allergic trigger of upper respiratory infection. Given duration and double sickening, treating with antibiotics. With history of asthma and frequent use of albuterol the past few days with faint wheeze on exam, prednisone sent in - if you prefer not to use, you can wait to see how you respond to the antibiotics first. Adding cough syrup for bedtime. Continue supportive measures including rest, hydration, humidifier use, steam showers, warm compresses to sinuses, warm liquids with lemon and honey, and over-the-counter cough, cold, and analgesics as needed.   ? ?Please contact office for follow-up if symptoms do not improve or worsen. Seek emergency care if symptoms become severe. ? ?

## 2021-12-23 NOTE — Progress Notes (Signed)
Covid test neg this morning ?Sick for 1 week ?Coughing up green phlegm ?Using inhaler q4-6 hrs ?

## 2021-12-24 ENCOUNTER — Encounter: Payer: Self-pay | Admitting: Neurology

## 2021-12-26 ENCOUNTER — Ambulatory Visit: Payer: BC Managed Care – PPO | Admitting: Neurology

## 2021-12-26 DIAGNOSIS — G43709 Chronic migraine without aura, not intractable, without status migrainosus: Secondary | ICD-10-CM

## 2021-12-26 MED ORDER — ONABOTULINUMTOXINA 100 UNITS IJ SOLR
200.0000 [IU] | Freq: Once | INTRAMUSCULAR | Status: AC
  Administered 2021-12-26: 155 [IU] via INTRAMUSCULAR

## 2021-12-26 NOTE — Progress Notes (Signed)
[facility]  ? ?Procedure Note Botox ? ?Attending: Dr. Metta Clines ? ?Preoperative Diagnosis(es): Chronic migraine ? ?Consent obtained from: The patient ?Benefits discussed included, but were not limited to decreased muscle tightness, increased joint range of motion, and decreased pain.  Risk discussed included, but were not limited pain and discomfort, bleeding, bruising, excessive weakness, venous thrombosis, muscle atrophy and dysphagia.  Anticipated outcomes of the procedure as well as he risks and benefits of the alternatives to the procedure, and the roles and tasks of the personnel to be involved, were discussed with the patient, and the patient consents to the procedure and agrees to proceed. A copy of the patient medication guide was given to the patient which explains the blackbox warning. ? ?Patients identity and treatment sites confirmed Yes.  . ? ?Details of Procedure: ?Skin was cleaned with alcohol. Prior to injection, the needle plunger was aspirated to make sure the needle was not within a blood vessel.  There was no blood retrieved on aspiration.   ? ?Following is a summary of the muscles injected  And the amount of Botulinum toxin used: ? ?Dilution ?200 units of Botox was reconstituted with 4 ml of preservative free normal saline. ?Time of reconstitution: At the time of the office visit (<30 minutes prior to injection)  ? ?Injections  ?155 total units of Botox was injected with a 30 gauge needle. ? ?Injection Sites: ?L occipitalis: 15 units- 3 sites  ?R occiptalis: 15 units- 3 sites ? ?L upper trapezius: 15 units- 3 sites ?R upper trapezius: 15 units- 3 sits          ?L paraspinal: 10 units- 2 sites ?R paraspinal: 10 units- 2 sites ? ?Face ?L frontalis(2 injection sites):10 units   ?R frontalis(2 injection sites):10 units         ?L corrugator: 5 units   ?R corrugator: 5 units           ?Procerus: 5 units   ?L temporalis: 20 units ?R temporalis: 20 units  ? ?Agent:  ?200 units of botulinum Type  A (Onobotulinum Toxin type A) was reconstituted with 4 ml of preservative free normal saline.  ?Time of reconstitution: At the time of the office visit (<30 minutes prior to injection)  ? ? ? Total injected (Units):  155 ? Total wasted (Units):  45 ? ?Patient tolerated procedure well without complications.   ?Reinjection is anticipated in 3 months. ? ?

## 2022-01-02 ENCOUNTER — Telehealth (HOSPITAL_COMMUNITY): Payer: Self-pay | Admitting: Pharmacy Technician

## 2022-01-02 NOTE — Telephone Encounter (Signed)
Patient Advocate Encounter ?  ?Received notification that prior authorization for Botox 200UNIT solution is required. ?  ?PA submitted on 01/02/2022 ?Key [id number] ?Status is pending ?   ? ? ? ?Lyndel Safe, CPhT ?Pharmacy Patient Advocate Specialist ?[facility] Patient Advocate Team ?Direct Number: [phone]  Fax: [phone]  ?

## 2022-01-03 DIAGNOSIS — Z1211 Encounter for screening for malignant neoplasm of colon: Secondary | ICD-10-CM | POA: Diagnosis not present

## 2022-01-06 ENCOUNTER — Other Ambulatory Visit (HOSPITAL_COMMUNITY): Payer: Self-pay

## 2022-01-06 NOTE — Telephone Encounter (Signed)
Patient Advocate Encounter ? ?Prior Authorization for Botox 200UNIT solution ?has been approved.   ? ? ?Effective dates: 01/02/2022 through 12/03/2022 ? ? ? ? ? ?Lyndel Safe, CPhT ?Pharmacy Patient Advocate Specialist ?[facility] Patient Advocate Team ?Direct Number: [phone]  Fax: [phone]  ?

## 2022-01-15 LAB — COLOGUARD: COLOGUARD: NEGATIVE

## 2022-01-26 ENCOUNTER — Telehealth: Payer: Self-pay | Admitting: Neurology

## 2022-01-26 NOTE — Telephone Encounter (Signed)
Patient called and stated she wanted to make sure about her botox approval before the next round in July.  She was told to check on it.  It looks like her other was approved until 02/2022.

## 2022-01-27 ENCOUNTER — Other Ambulatory Visit: Payer: Self-pay | Admitting: Neurology

## 2022-01-27 ENCOUNTER — Telehealth: Payer: Self-pay | Admitting: Neurology

## 2022-01-27 NOTE — Telephone Encounter (Signed)
Patient called and stated [facility] needs the prior authorization so they can refill the Botox.  The # is [id number] and the fax is [phone].

## 2022-01-27 NOTE — Telephone Encounter (Signed)
Advised patient of approval for Botox.

## 2022-01-27 NOTE — Telephone Encounter (Signed)
PA approval faxed off.

## 2022-02-09 ENCOUNTER — Ambulatory Visit: Payer: BC Managed Care – PPO | Admitting: Family Medicine
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
## 2022-02-16 ENCOUNTER — Encounter: Payer: Self-pay | Admitting: Neurology

## 2022-02-16 ENCOUNTER — Ambulatory Visit: Payer: BC Managed Care – PPO | Admitting: Neurology

## 2022-02-16 VITALS — BP 124/82 | HR 73 | Ht 64.0 in | Wt 155.0 lb

## 2022-02-16 DIAGNOSIS — G43709 Chronic migraine without aura, not intractable, without status migrainosus: Secondary | ICD-10-CM | POA: Diagnosis not present

## 2022-02-16 MED ORDER — GABAPENTIN 100 MG PO CAPS: ORAL_CAPSULE | ORAL | 5 refills | Status: DC

## 2022-02-16 NOTE — Patient Instructions (Signed)
Start gabapentin 118m in morning, 2062min afternoon and 30085mt bedtime Limit use of pain relievers (Fioricet, tramadol) to no more than 2 days out of week to prevent risk of rebound or medication-overuse headache. Let me know in 2 weeks if you would like to continue Botox Follow up in 4 months.

## 2022-02-28 ENCOUNTER — Other Ambulatory Visit: Payer: Self-pay | Admitting: Family Medicine

## 2022-03-01 ENCOUNTER — Other Ambulatory Visit: Payer: Self-pay | Admitting: Neurology

## 2022-03-03 ENCOUNTER — Telehealth: Payer: Self-pay | Admitting: Neurology

## 2022-03-04 NOTE — Telephone Encounter (Signed)
Patient has uploaded a new copy of her BCBS that will be effective 03/07/22. (This cannot be added to her guarantor account until after that date.)  Patient needs the PA team to have this information, the card, for her Botox PA, next appointment is 03/27/22.  Mattia will reach out to [facility] to confirm the correct insurance is used in relation to the delivery.

## 2022-03-04 NOTE — Telephone Encounter (Signed)
Per patient she was advised by [facility] that her Botox was approved by old insurance and should be due to ship by 03/05/22 and we should have it by 03/06/22 in office.

## 2022-03-05 DIAGNOSIS — G43709 Chronic migraine without aura, not intractable, without status migrainosus: Secondary | ICD-10-CM | POA: Diagnosis not present

## 2022-03-11 ENCOUNTER — Telehealth (HOSPITAL_COMMUNITY): Payer: Self-pay | Admitting: Pharmacy Technician

## 2022-03-11 ENCOUNTER — Telehealth: Payer: Self-pay | Admitting: Neurology

## 2022-03-11 ENCOUNTER — Other Ambulatory Visit (HOSPITAL_COMMUNITY): Payer: Self-pay

## 2022-03-11 NOTE — Telephone Encounter (Signed)
Pt called in wanting to double check on the status of her prior authorization with her new insurance. She stated she would be in the area today or Friday if she needs to sign anything.

## 2022-03-11 NOTE — Telephone Encounter (Signed)
Patient Advocate Encounter   Received notification that prior authorization for Botox 200UNIT solution is required.   PA submitted on 03/11/2022 Key [id number] Status is pending       Roland Earl, CPhT Pharmacy Patient Advocate Specialist [facility] Patient Advocate Team Direct Number: [phone]  Fax: [phone]

## 2022-03-11 NOTE — Telephone Encounter (Signed)
LMOVM PA update: No PA started will check with the PA team on Status.

## 2022-03-12 ENCOUNTER — Encounter: Payer: Self-pay | Admitting: Neurology

## 2022-03-12 NOTE — Telephone Encounter (Signed)
Patient advised of PA submitted.

## 2022-03-13 NOTE — Telephone Encounter (Signed)
Patient Advocate Encounter  Prior Authorization for Botox 200UNIT solution has been approved through Medical.     Effective dates: 03/11/2022 through 02/09/2023      Roland Earl, CPhT Pharmacy Patient Advocate Specialist [facility] Patient Advocate Team Direct Number: [phone]  Fax: [phone]

## 2022-03-16 ENCOUNTER — Other Ambulatory Visit: Payer: Self-pay

## 2022-03-16 MED ORDER — GABAPENTIN 300 MG PO CAPS: 300.0000 mg | ORAL_CAPSULE | Freq: Three times a day (TID) | ORAL | 0 refills | Status: DC

## 2022-03-16 MED ORDER — GABAPENTIN 300 MG PO CAPS: ORAL_CAPSULE | ORAL | 0 refills | Status: DC

## 2022-03-16 NOTE — Telephone Encounter (Signed)
Per Dr.Jaffe,  She may try increasing gabapentin to 300mg  three times daily.  We can send in a prescription for a 300mg  capsule.  If she prefers this, please send prescription   Gabpentin 300 mg TID sent to the [facility].

## 2022-03-24 ENCOUNTER — Telehealth: Payer: Self-pay | Admitting: Neurology

## 2022-03-24 NOTE — Telephone Encounter (Signed)
Pt wants to follow up regarding her botox appt and new ins

## 2022-03-24 NOTE — Telephone Encounter (Signed)
Botox approved Effective dates: 03/11/2022 through 02/09/2023.

## 2022-03-27 ENCOUNTER — Ambulatory Visit: Payer: BC Managed Care – PPO | Admitting: Neurology

## 2022-03-27 DIAGNOSIS — G43709 Chronic migraine without aura, not intractable, without status migrainosus: Secondary | ICD-10-CM | POA: Diagnosis not present

## 2022-03-27 MED ORDER — ONABOTULINUMTOXINA 100 UNITS IJ SOLR
200.0000 [IU] | Freq: Once | INTRAMUSCULAR | Status: AC
  Administered 2022-03-27: 155 [IU] via INTRAMUSCULAR

## 2022-03-27 MED ORDER — ONABOTULINUMTOXINA 200 UNITS IJ SOLR
200.0000 [IU] | Freq: Once | INTRAMUSCULAR | Status: AC
  Administered 2022-03-27: 155 [IU] via INTRAMUSCULAR

## 2022-03-27 MED ORDER — GABAPENTIN 100 MG PO CAPS: 200.0000 mg | ORAL_CAPSULE | Freq: Every morning | ORAL | 5 refills | Status: DC

## 2022-03-27 NOTE — Progress Notes (Signed)
[facility]  ° °Procedure Note Botox ° °Attending: Dr. Gerome Kokesh ° °Preoperative Diagnosis(es): Chronic migraine ° °Consent obtained from: The patient °Benefits discussed included, but were not limited to decreased muscle tightness, increased joint range of motion, and decreased pain.  Risk discussed included, but were not limited pain and discomfort, bleeding, bruising, excessive weakness, venous thrombosis, muscle atrophy and dysphagia.  Anticipated outcomes of the procedure as well as he risks and benefits of the alternatives to the procedure, and the roles and tasks of the personnel to be involved, were discussed with the patient, and the patient consents to the procedure and agrees to proceed. A copy of the patient medication guide was given to the patient which explains the blackbox warning. ° °Patients identity and treatment sites confirmed Yes.  . ° °Details of Procedure: °Skin was cleaned with alcohol. Prior to injection, the needle plunger was aspirated to make sure the needle was not within a blood vessel.  There was no blood retrieved on aspiration.   ° °Following is a summary of the muscles injected  And the amount of Botulinum toxin used: ° °Dilution °200 units of Botox was reconstituted with 4 ml of preservative free normal saline. °Time of reconstitution: At the time of the office visit (<30 minutes prior to injection)  ° °Injections  °155 total units of Botox was injected with a 30 gauge needle. ° °Injection Sites: °L occipitalis: 15 units- 3 sites  °R occiptalis: 15 units- 3 sites ° °L upper trapezius: 15 units- 3 sites °R upper trapezius: 15 units- 3 sits          °L paraspinal: 10 units- 2 sites °R paraspinal: 10 units- 2 sites ° °Face °L frontalis(2 injection sites):10 units   °R frontalis(2 injection sites):10 units         °L corrugator: 5 units   °R corrugator: 5 units           °Procerus: 5 units   °L temporalis: 20 units °R temporalis: 20 units  ° °Agent:  °200 units of botulinum Type  A (Onobotulinum Toxin type A) was reconstituted with 4 ml of preservative free normal saline.  °Time of reconstitution: At the time of the office visit (<30 minutes prior to injection)  ° ° ° Total injected (Units):  155 ° Total wasted (Units):  45 ° °Patient tolerated procedure well without complications.   °Reinjection is anticipated in 3 months. ° ° °

## 2022-03-31 ENCOUNTER — Ambulatory Visit: Payer: BC Managed Care – PPO | Admitting: Allergy

## 2022-04-06 NOTE — Progress Notes (Unsigned)
Follow Up Note  RE: Tami Mcdonald MRN: [id number] DOB: 20-Aug-1976 Date of Office Visit: 04/07/2022  Referring provider: Bradd Canary, MD Primary care provider: Bradd Canary, MD  Chief Complaint: No chief complaint on file.  History of Present Illness: I had the pleasure of seeing Tami Mcdonald for a follow up visit at the [facility] on 04/06/2022. She is a 46 y.o. female, who is being followed for asthma, allergic rhinoconjunctivitis, dyshidrotic eczema. Her previous allergy office visit was on 11/25/2021 with Dr. Selena Batten. Today is a regular follow up visit.  Asthma Past history - Diagnosed with asthma 4 years ago.  Takes Flovent 110 1 puff twice a day and albuterol rare occasions with good benefit.  No prior COVID-19 infection. 2022 spirometry was normal with no improvement in FEV1 post bronchodilator treatment.  Clinically feeling improved. Interim history - stable but noted some inability to take a deep enough breath at times.  Today's spirometry was normal.  Daily controller medication(s): INCREASE Flovent to 2 puffs twice a day with spacer and rinse mouth afterwards During upper respiratory infections/asthma flares:  Start Flovent 4 puffs twice a day for 1-2 weeks until your breathing symptoms return to baseline.  Pretreat with albuterol 2 puffs. May use albuterol rescue inhaler 2 puffs every 4 to 6 hours as needed for shortness of breath, chest tightness, coughing, and wheezing. May use albuterol rescue inhaler 2 puffs 5 to 15 minutes prior to strenuous physical activities. Monitor frequency of use.  Get spirometry at next visit.   Seasonal and perennial allergic rhinoconjunctivitis Past history - Perennial rhinoconjunctivitis symptoms for 40+ years.  2003 skin testing showed multiple positives and was on AIT for 4 to 5 years with good benefit.  Patient is a Administrator, [employer].  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 2022 skin testing showed: Positive to  grass, trees, mold, cat, dog, horse and dust mites. Borderline to mixed feathers, mouse, hamster, [place] pig, rabbit, gerbil and rat. Interim history - Singulair helped with symptoms but caused mood changes. Not interested in rush AIT but has some anxiety regarding AIT in general. Did not try new nasal spray yet.  Continue environmental control measures.  Use over the counter antihistamines such as Zyrtec (cetirizine), Claritin (loratadine), Allegra (fexofenadine), or Xyzal (levocetirizine) daily as needed. May take twice a day during allergy flares. May switch antihistamines every few months. Start Ryaltris (olopatadine + mometasone nasal spray combination) 1-2 sprays per nostril twice a day.  Nasal saline spray (i.e., Simply Saline) or nasal saline lavage (i.e., NeilMed) is recommended as needed and prior to medicated nasal sprays. Consider allergy injections for long term control if above medications do not help the symptoms - handout given.  Let us know when ready to start.   Dyshidrotic eczema On hands.  See below for proper skin care.  Try Epicerum twice a day - this is a moisturizer. Let me know if insurance won't cover it.  Use Eucrisa (crisaborole) 2% ointment twice a day on mild rash flares on the face and body. This is a non-steroid ointment. Sample given.    Migraine Past history - Worsening migraines. Followed by neurology. Concerned for food triggers and histamine issues. Noted issues with dairy and gluten flaring her asthma. 2022 skin testing showed: Negative to select foods. Interim history - botox gave 3 weeks of relief. Normal bloodwork. Continue to follow up with neurology.    Return in about 4 months (around 03/27/2022).  Assessment and Plan: Tami Mcdonald is a 46 y.o. female with: No problem-specific Assessment & Plan notes found for this encounter.  No follow-ups on file.  No orders of the defined types were placed in this encounter.  Lab Orders  No laboratory test(s)  ordered today    Diagnostics: Spirometry:  Tracings reviewed. Her effort: {Blank single:19197::"Good reproducible efforts.","It was hard to get consistent efforts and there is a question as to whether this reflects a maximal maneuver.","Poor effort, data can not be interpreted."} FVC: ***L FEV1: ***L, ***% predicted FEV1/FVC ratio: ***% Interpretation: {Blank single:19197::"Spirometry consistent with mild obstructive disease","Spirometry consistent with moderate obstructive disease","Spirometry consistent with severe obstructive disease","Spirometry consistent with possible restrictive disease","Spirometry consistent with mixed obstructive and restrictive disease","Spirometry uninterpretable due to technique","Spirometry consistent with normal pattern","No overt abnormalities noted given today's efforts"}.  Please see scanned spirometry results for details.  Skin Testing: {Blank single:19197::"Select foods","Environmental allergy panel","Environmental allergy panel and select foods","Food allergy panel","None","Deferred due to recent antihistamines use"}. *** Results discussed with patient/family.   Medication List:  Current Outpatient Medications  Medication Sig Dispense Refill  . traMADol (ULTRAM) 50 MG tablet TAKE 1 TABLET BY MOUTH EVERY 6 HOURS AS NEEDED 20 tablet 2  . ACETAMINOPHEN-BUTALBITAL 50-325 MG TABS Take 1 to 2 tablets by mouth every 6 hours as needed. 10 tablet 5  . albuterol (VENTOLIN HFA) 108 (90 Base) MCG/ACT inhaler INHALE 2 PUFFS INTO THE LUNGS EVERY 6 (SIX) HOURS AS NEEDED FOR WHEEZING. 8.5 g 1  . b complex vitamins tablet Take by mouth.    . Botulinum Toxin Type A (BOTOX) 200 units SOLR Inject 155 units IM Into Multiple sites of the face, head and neck every 90 days 1 each 4  . cholecalciferol (VITAMIN D3) 25 MCG (1000 UNIT) tablet Take 1,000 Units by mouth daily.    . Dermatological Products, Misc. [facility]) lotion Moisturize twice a day on the hands. 225 g 3  .  FLOVENT HFA 110 MCG/ACT inhaler INHALE 1 PUFF INTO THE LUNGS IN THE MORNING AND AT BEDTIME. 12 each 5  . fluticasone (FLONASE) 50 MCG/ACT nasal spray Place into both nostrils daily.    Marland Kitchen gabapentin (NEURONTIN) 100 MG capsule Take 2 capsules (200 mg total) by mouth in the morning. 60 capsule 5  . gabapentin (NEURONTIN) 300 MG capsule Take 1 capsule (300 mg total) by mouth 3 (three) times daily. 90 capsule 0  . Krill Oil CAPS Take by mouth. Mega red- 1-2 week    . Lactobacillus Rhamnosus, GG, (CULTURELLE PO) Take by mouth.    . Loratadine 10 MG CAPS Claritin    . Multiple Vitamin (MULTIVITAMIN) tablet Take 1 tablet by mouth daily. 2-3 week    . mupirocin ointment (BACTROBAN) 2 % Place 1 application into the nose at bedtime as needed. 22 g 1  . Olopatadine-Mometasone (RYALTRIS) [id number] MCG/ACT SUSP Place 1-2 sprays into the nose in the morning and at bedtime. 29 g 5  . ondansetron (ZOFRAN-ODT) 8 MG disintegrating tablet Take 1 tablet (8 mg total) by mouth every 8 (eight) hours as needed for nausea or vomiting. 20 tablet 3  . tiZANidine (ZANAFLEX) 4 MG tablet tizanidine 4 mg tablet  TAKE 1/2 TO 1 TABLET UP TO THREE TIMES DAILY AS NEEDED.    Marland Kitchen vitamin C (ASCORBIC ACID) 500 MG tablet Take 500 mg by mouth daily. Patient only takes occasionally     No current facility-administered medications for this visit.   Allergies: Allergies  Allergen Reactions  . Nortriptyline Hypertension  Palpitations, high blood pressure  . Other Other (See Comments), Nausea And Vomiting and Rash    Uncoded Allergy. Allergen: tape, tegaderm  . Dairy Aid [Tilactase]     DAIRY  . Gluten Meal     Gluten causes asthma attack and severe allergy symptoms  . Kiwi Extract   . Singulair [Montelukast]     Mood changes  . Vistaril [Hydroxyzine Hcl] Other (See Comments)    ?reaction type  . Aimovig [Erenumab-Aooe] Rash  . Nurtec [Rimegepant Sulfate] Rash  . Penicillins Rash  . Sulfa Antibiotics Nausea And Vomiting and Rash    I reviewed her past medical history, social history, family history, and environmental history and no significant changes have been reported from her previous visit.  Review of Systems  Constitutional:  Negative for appetite change, chills, fever and unexpected weight change.  HENT:  Positive for congestion. Negative for rhinorrhea.   Eyes:  Negative for itching.  Respiratory:  Negative for cough, chest tightness, shortness of breath and wheezing.   Cardiovascular:  Negative for chest pain.  Gastrointestinal:  Negative for abdominal pain.  Genitourinary:  Negative for difficulty urinating.  Skin:  Negative for rash.  Allergic/Immunologic: Positive for environmental allergies.  Neurological:  Positive for headaches.   Objective: There were no vitals taken for this visit. There is no height or weight on file to calculate BMI. Physical Exam Vitals and nursing note reviewed.  Constitutional:      Appearance: Normal appearance. She is well-developed.  HENT:     Head: Normocephalic and atraumatic.     Right Ear: Tympanic membrane and external ear normal.     Left Ear: Tympanic membrane and external ear normal.     Nose: Nose normal.     Mouth/Throat:     Mouth: Mucous membranes are moist.     Pharynx: Oropharynx is clear.  Eyes:     Conjunctiva/sclera: Conjunctivae normal.  Cardiovascular:     Rate and Rhythm: Normal rate and regular rhythm.     Heart sounds: Normal heart sounds. No murmur heard.    No friction rub. No gallop.  Pulmonary:     Effort: Pulmonary effort is normal.     Breath sounds: Normal breath sounds. No wheezing, rhonchi or rales.  Musculoskeletal:     Cervical back: Neck supple.  Skin:    General: Skin is warm.     Findings: No rash.  Neurological:     Mental Status: She is alert and oriented to person, place, and time.  Psychiatric:        Behavior: Behavior normal.  Previous notes and tests were reviewed. The plan was reviewed with the  patient/family, and all questions/concerned were addressed.  It was my pleasure to see Saidee today and participate in her care. Please feel free to contact me with any questions or concerns.  Sincerely,  Wyline Mood, DO Allergy & Immunology  [facility] office: [phone] [facility] office: [phone]

## 2022-04-07 ENCOUNTER — Encounter: Payer: Self-pay | Admitting: Allergy

## 2022-04-07 ENCOUNTER — Ambulatory Visit: Payer: BC Managed Care – PPO | Admitting: Allergy

## 2022-04-07 VITALS — BP 108/70 | HR 70 | Temp 98.3°F | Resp 18

## 2022-04-07 DIAGNOSIS — H1010 Acute atopic conjunctivitis, unspecified eye: Secondary | ICD-10-CM

## 2022-04-07 DIAGNOSIS — H1013 Acute atopic conjunctivitis, bilateral: Secondary | ICD-10-CM

## 2022-04-07 DIAGNOSIS — L301 Dyshidrosis [pompholyx]: Secondary | ICD-10-CM

## 2022-04-07 DIAGNOSIS — J4540 Moderate persistent asthma, uncomplicated: Secondary | ICD-10-CM

## 2022-04-07 DIAGNOSIS — G43009 Migraine without aura, not intractable, without status migrainosus: Secondary | ICD-10-CM

## 2022-04-07 DIAGNOSIS — J302 Other seasonal allergic rhinitis: Secondary | ICD-10-CM | POA: Diagnosis not present

## 2022-04-07 NOTE — Assessment & Plan Note (Signed)
Past history - Perennial rhinoconjunctivitis symptoms for 40+ years.  2003 skin testing showed multiple positives and was on AIT for 4 to 5 years with good benefit.  Patient is a Administrator, [employer].  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 2022 skin testing showed: Positive to grass, trees, mold, cat, dog, horse and dust mites. Borderline to mixed feathers, mouse, hamster, [place] pig, rabbit, gerbil and rat. Singulair caused mood changes. Interim history - doing better with air filters.   Use over the counter antihistamines such as Zyrtec (cetirizine), Claritin (loratadine), Allegra (fexofenadine), or Xyzal (levocetirizine) daily as needed. May take twice a day during allergy flares. May switch antihistamines every few months. . May use Ryaltris (olopatadine + mometasone nasal spray combination) 1-2 sprays per nostril twice a day.  . Nasal saline spray (i.e., Simply Saline) or nasal saline lavage (i.e., NeilMed) is recommended as needed and prior to medicated nasal sprays.  Consider allergy injections for long term control if above medications do not help the symptoms.   Let us know when ready to start.

## 2022-04-07 NOTE — Assessment & Plan Note (Signed)
Past history - Diagnosed with asthma 4 years ago.  Takes Flovent 110 1 puff twice a day and albuterol rare occasions with good benefit.  No prior COVID-19 infection. 2022 spirometry was normal with no improvement in FEV1 post bronchodilator treatment.  Clinically feeling improved. Interim history - well controlled.   Today's spirometry showed some mild restriction. . Daily controller medication(s): continue Flovent 1 puff twice a day with spacer and rinse mouth afterwards . During upper respiratory infections/asthma flares:  o Increase Flovent 2 puffs twice a day for 1-2 weeks until your breathing symptoms return to baseline.  o Pretreat with albuterol 2 puffs. . May use albuterol rescue inhaler 2 puffs every 4 to 6 hours as needed for shortness of breath, chest tightness, coughing, and wheezing. May use albuterol rescue inhaler 2 puffs 5 to 15 minutes prior to strenuous physical activities. Monitor frequency of use.  . Get spirometry at next visit.

## 2022-04-07 NOTE — Patient Instructions (Addendum)
Asthma: Daily controller medication(s): Flovent 1 puff twice a day with spacer and rinse mouth afterwards During upper respiratory infections/asthma flares:  Increase Flovent 2 puffs twice a day for 1-2 weeks until your breathing symptoms return to baseline.  Pretreat with albuterol 2 puffs. May use albuterol rescue inhaler 2 puffs every 4 to 6 hours as needed for shortness of breath, chest tightness, coughing, and wheezing. May use albuterol rescue inhaler 2 puffs 5 to 15 minutes prior to strenuous physical activities. Monitor frequency of use.  Asthma control goals:  Full participation in all desired activities (may need albuterol before activity) Albuterol use two times or less a week on average (not counting use with activity) Cough interfering with sleep two times or less a month Oral steroids no more than once a year No hospitalizations   Environmental allergies 2022 skin testing: Positive to grass, trees, mold, cat, dog, horse and dust mites. Borderline to mixed feathers, mouse, hamster, [place] pig, rabbit, gerbil and rat. Continue environmental control measures. Use over the counter antihistamines such as Zyrtec (cetirizine), Claritin (loratadine), Allegra (fexofenadine), or Xyzal (levocetirizine) daily as needed. May take twice a day during allergy flares. May switch antihistamines every few months. May use Ryaltris (olopatadine + mometasone nasal spray combination) 1-2 sprays per nostril twice a day.  Nasal saline spray (i.e., Simply Saline) or nasal saline lavage (i.e., NeilMed) is recommended as needed and prior to medicated nasal sprays. Consider allergy injections for long term control if above medications do not help the symptoms.  Let us know when ready to start.  Atopic dermatitis Continue proper skin care.  May use Epicerum twice a day as needed - this is a moisturizer. Use Eucrisa (crisaborole) 2% ointment twice a day on mild rash flares on the face and body.  This is a non-steroid ointment.  If it burns, place the medication in the refrigerator.  Apply a thin layer of moisturizer and then apply the Eucrisa on top of it.  Migraines/headaches: Continue to follow up with your neurologist  Regarding patch testing Find out what ingredients are in the tattoo ink. I only have the TRUE patch test which checks for Cobalt - found in blue tattoo ink. We can also patch test for the ink sample itself if you are able to get a small sample.  True Test looks for the following sensitivities:      Follow up in 6 months or sooner if needed.    Skin care recommendations  Bath time: Always use lukewarm water. AVOID very hot or cold water. Keep bathing time to 5-10 minutes. Do NOT use bubble bath. Use a mild soap and use just enough to wash the dirty areas. Do NOT scrub skin vigorously.  After bathing, pat dry your skin with a towel. Do NOT rub or scrub the skin.  Moisturizers and prescriptions:  ALWAYS apply moisturizers immediately after bathing (within 3 minutes). This helps to lock-in moisture. Use the moisturizer several times a day over the whole body. Good summer moisturizers include: Aveeno, CeraVe, Cetaphil. Good winter moisturizers include: Aquaphor, Vaseline, Cerave, Cetaphil, Eucerin, Vanicream. When using moisturizers along with medications, the moisturizer should be applied about one hour after applying the medication to prevent diluting effect of the medication or moisturize around where you applied the medications. When not using medications, the moisturizer can be continued twice daily as maintenance.  Laundry and clothing: Avoid laundry products with added color or perfumes. Use unscented hypo-allergenic laundry products such as Tide free, Cheer  free & gentle, and All free and clear.  If the skin still seems dry or sensitive, you can try double-rinsing the clothes. Avoid tight or scratchy clothing such as wool. Do not use fabric  softeners or dyer sheets.

## 2022-04-07 NOTE — Assessment & Plan Note (Addendum)
Controlled. Thinking of getting a tattoo but concerned about possible reaction to it. No prior tattoos. . Continue proper skin care.  . May use Epicerum twice a day as needed - this is a moisturizer. . Use Eucrisa (crisaborole) 2% ointment twice a day on mild rash flares on the face and body. This is a non-steroid ointment. . Find out what ingredients are in the tattoo ink. o Offered TRUE Patch test (Cobalt sometimes found in blue tattoo ink). o May bring a sample of tattoo ink to patch test to as well.

## 2022-04-07 NOTE — Assessment & Plan Note (Signed)
Past history - Worsening migraines. Followed by neurology. Concerned for food triggers and histamine issues. Noted issues with dairy and gluten flaring her asthma. 2022 skin testing showed: Negative to select foods. Interim history - keto diet helps.  . Continue to follow up with neurology.

## 2022-05-11 ENCOUNTER — Other Ambulatory Visit: Payer: Self-pay | Admitting: Neurology

## 2022-05-13 DIAGNOSIS — H1789 Other corneal scars and opacities: Secondary | ICD-10-CM | POA: Diagnosis not present

## 2022-05-13 DIAGNOSIS — H5213 Myopia, bilateral: Secondary | ICD-10-CM | POA: Diagnosis not present

## 2022-05-14 ENCOUNTER — Other Ambulatory Visit: Payer: Self-pay | Admitting: Obstetrics and Gynecology

## 2022-05-14 DIAGNOSIS — Z1231 Encounter for screening mammogram for malignant neoplasm of breast: Secondary | ICD-10-CM

## 2022-06-06 ENCOUNTER — Other Ambulatory Visit: Payer: Self-pay | Admitting: Neurology

## 2022-06-08 DIAGNOSIS — G43709 Chronic migraine without aura, not intractable, without status migrainosus: Secondary | ICD-10-CM | POA: Diagnosis not present

## 2022-06-11 ENCOUNTER — Telehealth: Payer: Self-pay | Admitting: Neurology

## 2022-06-11 NOTE — Telephone Encounter (Signed)
Patient is on the way to get acupuncture for her migraines and wants to know if there is any contra-indication with botox?

## 2022-06-11 NOTE — Telephone Encounter (Signed)
Tried calling patient, no answer. LMOVM per dr.Jaffe,  No contraindication

## 2022-06-26 ENCOUNTER — Ambulatory Visit (INDEPENDENT_AMBULATORY_CARE_PROVIDER_SITE_OTHER): Payer: BC Managed Care – PPO | Admitting: Neurology

## 2022-06-26 ENCOUNTER — Ambulatory Visit
Admission: RE | Admit: 2022-06-26 | Discharge: 2022-06-26 | Disposition: A | Discharge status: Home / Self Care | Payer: BC Managed Care – PPO | Source: Ambulatory Visit | Attending: Obstetrics and Gynecology | Admitting: Obstetrics and Gynecology

## 2022-06-26 ENCOUNTER — Telehealth: Payer: Self-pay

## 2022-06-26 ENCOUNTER — Ambulatory Visit (INDEPENDENT_AMBULATORY_CARE_PROVIDER_SITE_OTHER): Payer: BC Managed Care – PPO | Admitting: Radiology

## 2022-06-26 ENCOUNTER — Ambulatory Visit: Payer: BC Managed Care – PPO

## 2022-06-26 VITALS — BP 114/82 | Temp 98.7°F

## 2022-06-26 DIAGNOSIS — N6325 Unspecified lump in the left breast, overlapping quadrants: Secondary | ICD-10-CM

## 2022-06-26 DIAGNOSIS — G43709 Chronic migraine without aura, not intractable, without status migrainosus: Secondary | ICD-10-CM | POA: Diagnosis not present

## 2022-06-26 DIAGNOSIS — Z1231 Encounter for screening mammogram for malignant neoplasm of breast: Secondary | ICD-10-CM

## 2022-06-26 MED ORDER — ONABOTULINUMTOXINA 200 UNITS IJ SOLR
200.0000 [IU] | Freq: Once | INTRAMUSCULAR | Status: AC
  Administered 2022-06-26: 155 [IU] via INTRAMUSCULAR

## 2022-06-26 NOTE — Progress Notes (Signed)
[facility]  ° °Procedure Note Botox ° °Attending: Dr. Wessley Emert ° °Preoperative Diagnosis(es): Chronic migraine ° °Consent obtained from: The patient °Benefits discussed included, but were not limited to decreased muscle tightness, increased joint range of motion, and decreased pain.  Risk discussed included, but were not limited pain and discomfort, bleeding, bruising, excessive weakness, venous thrombosis, muscle atrophy and dysphagia.  Anticipated outcomes of the procedure as well as he risks and benefits of the alternatives to the procedure, and the roles and tasks of the personnel to be involved, were discussed with the patient, and the patient consents to the procedure and agrees to proceed. A copy of the patient medication guide was given to the patient which explains the blackbox warning. ° °Patients identity and treatment sites confirmed Yes.  . ° °Details of Procedure: °Skin was cleaned with alcohol. Prior to injection, the needle plunger was aspirated to make sure the needle was not within a blood vessel.  There was no blood retrieved on aspiration.   ° °Following is a summary of the muscles injected  And the amount of Botulinum toxin used: ° °Dilution °200 units of Botox was reconstituted with 4 ml of preservative free normal saline. °Time of reconstitution: At the time of the office visit (<30 minutes prior to injection)  ° °Injections  °155 total units of Botox was injected with a 30 gauge needle. ° °Injection Sites: °L occipitalis: 15 units- 3 sites  °R occiptalis: 15 units- 3 sites ° °L upper trapezius: 15 units- 3 sites °R upper trapezius: 15 units- 3 sits          °L paraspinal: 10 units- 2 sites °R paraspinal: 10 units- 2 sites ° °Face °L frontalis(2 injection sites):10 units   °R frontalis(2 injection sites):10 units         °L corrugator: 5 units   °R corrugator: 5 units           °Procerus: 5 units   °L temporalis: 20 units °R temporalis: 20 units  ° °Agent:  °200 units of botulinum Type  A (Onobotulinum Toxin type A) was reconstituted with 4 ml of preservative free normal saline.  °Time of reconstitution: At the time of the office visit (<30 minutes prior to injection)  ° ° ° Total injected (Units):  155 ° Total wasted (Units):  45 ° °Patient tolerated procedure well without complications.   °Reinjection is anticipated in 3 months. ° ° °

## 2022-06-26 NOTE — Progress Notes (Signed)
   Tami Mcdonald 07-16-1976 [id number]   History:  46 y.o. Complains of non-tender left breast lump.  Noticed area last week. turned away from screening mammogram appointment today due to patient reporting left breast lump. M 06/23/21  Gynecologic History Patient's last menstrual period was 06/23/2022 (approximate).     Obstetric History OB History  Gravida Para Term Preterm AB Living  2 2 2     2   SAB IAB Ectopic Multiple Live Births               # Outcome Date GA Lbr Len/2nd Weight Sex Delivery Anes PTL Lv  2 Term           1 Term              The following portions of the patient's history were reviewed and updated as appropriate: allergies, current medications, past family history, past medical history, past social history, past surgical history, and problem list.  Review of Systems Pertinent items noted in HPI and remainder of comprehensive ROS otherwise negative.   Past medical history, past surgical history, family history and social history were all reviewed and documented in the EPIC chart.   Exam:  Vitals:   06/26/22 1429  BP: 114/82  Temp: 98.7 F (37.1 C)  TempSrc: Oral   There is no height or weight on file to calculate BMI.  General appearance:  Normal Thyroid:  Symmetrical, normal in size, without palpable masses or nodularity. Respiratory  Auscultation:  Clear without wheezing or rhonchi Cardiovascular  Auscultation:  Regular rate, without rubs, murmurs or gallops  Edema/varicosities:  Not grossly evident Breasts: right breast normal without mass, skin or nipple changes or axillary nodes, abnormal mass palpable 9 o'clock, 0.5cm, nontender.   Patient informed chaperone available to be present for breast exam. Patient has requested no chaperone to be present.   Assessment/Plan:   1. Mass overlapping multiple quadrants of left breast Dx mammo ordered    [name] WHNP-BC 2:47 PM 06/26/2022

## 2022-06-26 NOTE — Telephone Encounter (Signed)
FYI. BS Pt calling to report being turned away from her screening mammogram due to recently finding lump in breast. Pt reports lump found in left breast. Reports firm lump but mobile like, btwn pea-marble size. Denies trauma to breast, discharge from nipple, or skin changes. I put her in your 230 slot today. Pt agreed to arrive 10-15 mins early.

## 2022-06-29 ENCOUNTER — Telehealth: Payer: Self-pay | Admitting: *Deleted

## 2022-06-29 DIAGNOSIS — N6325 Unspecified lump in the left breast, overlapping quadrants: Secondary | ICD-10-CM

## 2022-06-29 NOTE — Telephone Encounter (Signed)
-----   Message from Kerry Dory, NP sent at 06/26/2022  2:34 PM EDT ----- Regarding: Dx mammo Please schedule for dx mammo, left breast mass

## 2022-06-29 NOTE — Telephone Encounter (Signed)
Patient scheduled on 07/06/22 at the breast center.

## 2022-06-30 ENCOUNTER — Ambulatory Visit: Payer: BC Managed Care – PPO | Admitting: Neurology

## 2022-07-06 ENCOUNTER — Ambulatory Visit
Admission: RE | Admit: 2022-07-06 | Discharge: 2022-07-06 | Disposition: A | Discharge status: Home / Self Care | Payer: BC Managed Care – PPO | Source: Ambulatory Visit | Attending: Radiology | Admitting: Radiology

## 2022-07-06 DIAGNOSIS — N6325 Unspecified lump in the left breast, overlapping quadrants: Secondary | ICD-10-CM

## 2022-07-06 DIAGNOSIS — R92333 Mammographic heterogeneous density, bilateral breasts: Secondary | ICD-10-CM | POA: Diagnosis not present

## 2022-07-06 DIAGNOSIS — N6002 Solitary cyst of left breast: Secondary | ICD-10-CM | POA: Diagnosis not present
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
## 2022-07-07 ENCOUNTER — Ambulatory Visit: Payer: BC Managed Care – PPO | Admitting: Neurology

## 2022-07-07 NOTE — Telephone Encounter (Signed)
Pt was seen by JC on 06/26/22 and had breast imaging done on 07/06/2022.

## 2022-07-08 ENCOUNTER — Encounter: Payer: Self-pay | Admitting: Neurology

## 2022-07-08 ENCOUNTER — Ambulatory Visit: Payer: BC Managed Care – PPO | Admitting: Neurology

## 2022-07-08 VITALS — BP 109/70 | HR 77 | Ht 64.0 in | Wt 151.0 lb

## 2022-07-08 DIAGNOSIS — G43109 Migraine with aura, not intractable, without status migrainosus: Secondary | ICD-10-CM | POA: Diagnosis not present

## 2022-07-08 MED ORDER — TOPIRAMATE 25 MG PO TABS: 25.0000 mg | ORAL_TABLET | Freq: Every day | ORAL | 5 refills | Status: DC

## 2022-07-08 MED ORDER — ZAVZPRET 10 MG/ACT NA SOLN: 10.0000 mg | Freq: Every day | NASAL | 11 refills | Status: DC | PRN

## 2022-07-08 NOTE — Patient Instructions (Signed)
Take gabapentin once at bedtime for one week, then stop Start topiramate 25mg  at bedtime.  If no improvement in 4 weeks, contact me and we can increase dose Continue Botox Try Zavzpret nasal spray - 1 spray in one nostril.  Take once in 24 hours.  Let me know if effective.  Otherwise, you have bultalbital and tramadol Limit use of pain relievers to no more than 2 days out of week to prevent risk of rebound or medication-overuse headache. Keep headache diary Follow up 6 months.

## 2022-08-17 DIAGNOSIS — E785 Hyperlipidemia, unspecified: Secondary | ICD-10-CM | POA: Insufficient documentation

## 2022-08-17 NOTE — Assessment & Plan Note (Signed)
Patient encouraged to maintain heart healthy diet, regular exercise, adequate sleep. Consider daily probiotics. Take medications as prescribed. Labs ordered and reviewed. Follows with GYN, pap in April 2023 repeat in 3-5 years, Dr Wyvonnia Lora [facility] 06/2022 repeat in 2024 Cologuard April 2023, repeat in 3 years

## 2022-08-17 NOTE — Assessment & Plan Note (Signed)
Encourage heart healthy diet such as MIND or DASH diet, increase exercise, avoid trans fats, simple carbohydrates and processed foods, consider a krill or fish or flaxseed oil cap daily.  °

## 2022-08-17 NOTE — Assessment & Plan Note (Signed)
Supplement and monitor 

## 2022-08-18 ENCOUNTER — Encounter: Payer: Self-pay | Admitting: Family Medicine

## 2022-08-18 ENCOUNTER — Ambulatory Visit (INDEPENDENT_AMBULATORY_CARE_PROVIDER_SITE_OTHER): Payer: BC Managed Care – PPO | Admitting: Family Medicine

## 2022-08-18 ENCOUNTER — Encounter: Payer: Self-pay | Admitting: Neurology

## 2022-08-18 VITALS — BP 116/72 | HR 79 | Temp 98.0°F | Resp 16 | Ht 64.0 in | Wt 150.2 lb

## 2022-08-18 DIAGNOSIS — R252 Cramp and spasm: Secondary | ICD-10-CM | POA: Diagnosis not present

## 2022-08-18 DIAGNOSIS — Z0000 Encounter for general adult medical examination without abnormal findings: Secondary | ICD-10-CM

## 2022-08-18 DIAGNOSIS — G43009 Migraine without aura, not intractable, without status migrainosus: Secondary | ICD-10-CM | POA: Diagnosis not present

## 2022-08-18 DIAGNOSIS — R739 Hyperglycemia, unspecified: Secondary | ICD-10-CM

## 2022-08-18 DIAGNOSIS — R631 Polydipsia: Secondary | ICD-10-CM | POA: Diagnosis not present

## 2022-08-18 DIAGNOSIS — E559 Vitamin D deficiency, unspecified: Secondary | ICD-10-CM | POA: Diagnosis not present

## 2022-08-18 DIAGNOSIS — K649 Unspecified hemorrhoids: Secondary | ICD-10-CM | POA: Diagnosis not present

## 2022-08-18 DIAGNOSIS — E785 Hyperlipidemia, unspecified: Secondary | ICD-10-CM | POA: Diagnosis not present

## 2022-08-18 DIAGNOSIS — R3589 Other polyuria: Secondary | ICD-10-CM

## 2022-08-18 DIAGNOSIS — J3489 Other specified disorders of nose and nasal sinuses: Secondary | ICD-10-CM

## 2022-08-18 LAB — CBC WITH DIFFERENTIAL/PLATELET
Basophils Absolute: 0 10*3/uL (ref 0.0–0.1)
Basophils Relative: 0.8 % (ref 0.0–3.0)
Eosinophils Absolute: 0.1 10*3/uL (ref 0.0–0.7)
Eosinophils Relative: 2.1 % (ref 0.0–5.0)
HCT: 38.8 % (ref 36.0–46.0)
Hemoglobin: 13 g/dL (ref 12.0–15.0)
Lymphocytes Relative: 34.7 % (ref 12.0–46.0)
Lymphs Abs: 1.6 10*3/uL (ref 0.7–4.0)
MCHC: 33.6 g/dL (ref 30.0–36.0)
MCV: 88.1 fl (ref 78.0–100.0)
Monocytes Absolute: 0.4 10*3/uL (ref 0.1–1.0)
Monocytes Relative: 9 % (ref 3.0–12.0)
Neutro Abs: 2.4 10*3/uL (ref 1.4–7.7)
Neutrophils Relative %: 53.4 % (ref 43.0–77.0)
Platelets: 345 10*3/uL (ref 150.0–400.0)
RBC: 4.4 Mil/uL (ref 3.87–5.11)
RDW: 14.9 % (ref 11.5–15.5)
WBC: 4.5 10*3/uL (ref 4.0–10.5)

## 2022-08-18 LAB — URINALYSIS, ROUTINE W REFLEX MICROSCOPIC
Bilirubin Urine: NEGATIVE
Hgb urine dipstick: NEGATIVE
Ketones, ur: NEGATIVE
Leukocytes,Ua: NEGATIVE
Nitrite: NEGATIVE
RBC / HPF: NONE SEEN (ref 0–?)
Specific Gravity, Urine: 1.01 (ref 1.000–1.030)
Total Protein, Urine: NEGATIVE
Urine Glucose: NEGATIVE
Urobilinogen, UA: 0.2 (ref 0.0–1.0)
pH: 7 (ref 5.0–8.0)

## 2022-08-18 LAB — COMPREHENSIVE METABOLIC PANEL
ALT: 20 U/L (ref 0–35)
AST: 20 U/L (ref 0–37)
Albumin: 4.2 g/dL (ref 3.5–5.2)
Alkaline Phosphatase: 48 U/L (ref 39–117)
BUN: 9 mg/dL (ref 6–23)
CO2: 31 mEq/L (ref 19–32)
Calcium: 9.4 mg/dL (ref 8.4–10.5)
Chloride: 104 mEq/L (ref 96–112)
Creatinine, Ser: 0.73 mg/dL (ref 0.40–1.20)
GFR: 97.6 mL/min (ref >60.00)
Glucose, Bld: 79 mg/dL (ref 70–99)
Potassium: 4 mEq/L (ref 3.5–5.1)
Sodium: 140 mEq/L (ref 135–145)
Total Bilirubin: 0.4 mg/dL (ref 0.2–1.2)
Total Protein: 6.3 g/dL (ref 6.0–8.3)

## 2022-08-18 LAB — MAGNESIUM: Magnesium: 2.2 mg/dL (ref 1.5–2.5)

## 2022-08-18 LAB — LIPID PANEL
Cholesterol: 224 mg/dL — ABNORMAL HIGH (ref 0–200)
HDL: 81.7 mg/dL (ref >39.00)
LDL Cholesterol: 121 mg/dL — ABNORMAL HIGH (ref 0–99)
NonHDL: 142.24
Total CHOL/HDL Ratio: 3
Triglycerides: 106 mg/dL (ref 0.0–149.0)
VLDL: 21.2 mg/dL (ref 0.0–40.0)

## 2022-08-18 LAB — TSH: TSH: 1.91 u[IU]/mL (ref 0.35–5.50)

## 2022-08-18 LAB — HEMOGLOBIN A1C: Hgb A1c MFr Bld: 5.3 % (ref 4.6–6.5)

## 2022-08-18 LAB — VITAMIN B12: Vitamin B-12: 357 pg/mL (ref 211–911)

## 2022-08-18 LAB — VITAMIN D 25 HYDROXY (VIT D DEFICIENCY, FRACTURES): VITD: 42.2 ng/mL (ref 30.00–100.00)

## 2022-08-18 NOTE — Patient Instructions (Signed)
Yerba Matte tea do not drink   Preventive Care 40-47 Years Old, Female Preventive care refers to lifestyle choices and visits with your health care provider that can promote health and wellness. Preventive care visits are also called wellness exams. What can I expect for my preventive care visit? Counseling Your health care provider may ask you questions about your: Medical history, including: Past medical problems. Family medical history. Pregnancy history. Current health, including: Menstrual cycle. Method of birth control. Emotional well-being. Home life and relationship well-being. Sexual activity and sexual health. Lifestyle, including: Alcohol, nicotine or tobacco, and drug use. Access to firearms. Diet, exercise, and sleep habits. Work and work environment. Sunscreen use. Safety issues such as seatbelt and bike helmet use. Physical exam Your health care provider will check your: Height and weight. These may be used to calculate your BMI (body mass index). BMI is a measurement that tells if you are at a healthy weight. Waist circumference. This measures the distance around your waistline. This measurement also tells if you are at a healthy weight and may help predict your risk of certain diseases, such as type 2 diabetes and high blood pressure. Heart rate and blood pressure. Body temperature. Skin for abnormal spots. What immunizations do I need?  Vaccines are usually given at various ages, according to a schedule. Your health care provider will recommend vaccines for you based on your age, medical history, and lifestyle or other factors, such as travel or where you work. What tests do I need? Screening Your health care provider may recommend screening tests for certain conditions. This may include: Lipid and cholesterol levels. Diabetes screening. This is done by checking your blood sugar (glucose) after you have not eaten for a while (fasting). Pelvic exam and Pap  test. Hepatitis B test. Hepatitis C test. HIV (human immunodeficiency virus) test. STI (sexually transmitted infection) testing, if you are at risk. Lung cancer screening. Colorectal cancer screening. Mammogram. Talk with your health care provider about when you should start having regular mammograms. This may depend on whether you have a family history of breast cancer. BRCA-related cancer screening. This may be done if you have a family history of breast, ovarian, tubal, or peritoneal cancers. Bone density scan. This is done to screen for osteoporosis. Talk with your health care provider about your test results, treatment options, and if necessary, the need for more tests. Follow these instructions at home: Eating and drinking  Eat a diet that includes fresh fruits and vegetables, whole grains, lean protein, and low-fat dairy products. Take vitamin and mineral supplements as recommended by your health care provider. Do not drink alcohol if: Your health care provider tells you not to drink. You are pregnant, may be pregnant, or are planning to become pregnant. If you drink alcohol: Limit how much you have to 0-1 drink a day. Know how much alcohol is in your drink. In the [place] one drink equals one 12 oz bottle of beer (355 mL), one 5 oz glass of wine (148 mL), or one 1 oz glass of hard liquor (44 mL). Lifestyle Brush your teeth every morning and night with fluoride toothpaste. Floss one time each day. Exercise for at least 30 minutes 5 or more days each week. Do not use any products that contain nicotine or tobacco. These products include cigarettes, chewing tobacco, and vaping devices, such as e-cigarettes. If you need help quitting, ask your health care provider. Do not use drugs. If you are sexually active, practice safe sex. Use   condom or other form of protection to prevent STIs. If you do not wish to become pregnant, use a form of birth control. If you plan to become pregnant,  see your health care provider for a prepregnancy visit. Take aspirin only as told by your health care provider. Make sure that you understand how much to take and what form to take. Work with your health care provider to find out whether it is safe and beneficial for you to take aspirin daily. Find healthy ways to manage stress, such as: Meditation, yoga, or listening to music. Journaling. Talking to a trusted person. Spending time with friends and family. Minimize exposure to UV radiation to reduce your risk of skin cancer. Safety Always wear your seat belt while driving or riding in a vehicle. Do not drive: If you have been drinking alcohol. Do not ride with someone who has been drinking. When you are tired or distracted. While texting. If you have been using any mind-altering substances or drugs. Wear a helmet and other protective equipment during sports activities. If you have firearms in your house, make sure you follow all gun safety procedures. Seek help if you have been physically or sexually abused. What's next? Visit your health care provider once a year for an annual wellness visit. Ask your health care provider how often you should have your eyes and teeth checked. Stay up to date on all vaccines. This information is not intended to replace advice given to you by your health care provider. Make sure you discuss any questions you have with your health care provider. Document Revised: 02/19/2021 Document Reviewed: 02/19/2021 Elsevier Patient Education  [place].

## 2022-08-18 NOTE — Assessment & Plan Note (Addendum)
Is following with [facility], Dr Everlena Cooper but is struggling to get an adequate treatment. She has had botox for a year with minimal relief. She has not tolerated most meds. Is using Butalbital and Tramadol infrequently with good results
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
## 2022-08-19 DIAGNOSIS — R252 Cramp and spasm: Secondary | ICD-10-CM | POA: Insufficient documentation

## 2022-08-19 DIAGNOSIS — K649 Unspecified hemorrhoids: Secondary | ICD-10-CM | POA: Insufficient documentation

## 2022-08-19 DIAGNOSIS — R739 Hyperglycemia, unspecified: Secondary | ICD-10-CM | POA: Insufficient documentation

## 2022-08-19 DIAGNOSIS — R631 Polydipsia: Secondary | ICD-10-CM | POA: Insufficient documentation

## 2022-08-19 NOTE — Assessment & Plan Note (Signed)
And polyuria will check labs.

## 2022-08-19 NOTE — Assessment & Plan Note (Signed)
Has intermittent bleeding and is ready for referral to gastroenterology for evaluation. No present bleeding.

## 2022-08-19 NOTE — Assessment & Plan Note (Signed)
hgba1c acceptable, minimize simple carbs. Increase exercise as tolerated.  

## 2022-08-19 NOTE — Assessment & Plan Note (Signed)
Continues to be manageable with prn mupirocin use infrequently

## 2022-08-19 NOTE — Assessment & Plan Note (Signed)
Hydrate and monitor 

## 2022-08-20 ENCOUNTER — Encounter: Payer: Self-pay | Admitting: Family Medicine

## 2022-08-20 LAB — ZINC: Zinc: 81 ug/dL (ref 60–130)

## 2022-08-22 LAB — VITAMIN B1: Vitamin B1 (Thiamine): 14 nmol/L (ref 8–30)

## 2022-09-03 ENCOUNTER — Encounter: Payer: Self-pay | Admitting: Gastroenterology

## 2022-09-04 ENCOUNTER — Telehealth: Payer: Self-pay

## 2022-09-04 NOTE — Telephone Encounter (Signed)
Chat with [facility] to place Botox on hold for now. Unsure if patient wants to continue botox, CMA, I have a patient Tami Mcdonald Apr 06, 1976 [place] [phone]. Right now we want her script to be on hold. until further notice. Hello! A representative will be with you shortly. ANDREW has joined the conversation. ANDREW Good Afternoon, this is Greig Castilla, I will be happy to assist you. ANDREW: Please allow me 2 to 3 minutes to pull up and review the Pt's account. ANDREW May I have the name of the medication please? You Botox 200 units ANDREW okay thank you ANDREW and thanks for waiting. ANDREW The Rx we had on file just expired on 08/21/2022 so we won't fill the Rx until the prescriber send [place] new Rx.
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
## 2022-09-25 ENCOUNTER — Encounter: Payer: Self-pay | Admitting: Neurology

## 2022-09-25 ENCOUNTER — Ambulatory Visit: Payer: BC Managed Care – PPO | Admitting: Neurology

## 2022-09-25 VITALS — BP 134/89 | HR 84 | Ht 64.0 in | Wt 151.6 lb

## 2022-09-25 DIAGNOSIS — G43109 Migraine with aura, not intractable, without status migrainosus: Secondary | ICD-10-CM

## 2022-09-25 NOTE — Patient Instructions (Signed)
Continue Cefaly as preventative and as rescue management Continue massage May use Fioricet or tramadol as second line or if Cefaly not available, but sparingly.  Definitely no more than once a week. Limit use of pain relievers to no more than 2 days out of week to prevent risk of rebound or medication-overuse headache. Keep headache diary Follow up 3 months.

## 2022-09-29 ENCOUNTER — Other Ambulatory Visit: Payer: Self-pay | Admitting: Family Medicine

## 2022-09-29 MED ORDER — QVAR REDIHALER 40 MCG/ACT IN AERB: 1.0000 | INHALATION_SPRAY | Freq: Two times a day (BID) | RESPIRATORY_TRACT | 5 refills | Status: DC | PRN

## 2022-09-29 NOTE — Telephone Encounter (Signed)
Flovent HFA d/c from manufacturer. Plan doesn't cover generic fluticasone. Please advise.

## 2022-10-02 ENCOUNTER — Encounter: Payer: Self-pay | Admitting: Family Medicine

## 2022-10-02 ENCOUNTER — Ambulatory Visit (INDEPENDENT_AMBULATORY_CARE_PROVIDER_SITE_OTHER): Payer: BC Managed Care – PPO | Admitting: Family Medicine

## 2022-10-02 VITALS — BP 110/84 | HR 83 | Temp 97.9°F | Resp 16 | Ht 64.0 in | Wt 152.4 lb

## 2022-10-02 DIAGNOSIS — E162 Hypoglycemia, unspecified: Secondary | ICD-10-CM

## 2022-10-02 DIAGNOSIS — R232 Flushing: Secondary | ICD-10-CM

## 2022-10-02 DIAGNOSIS — R739 Hyperglycemia, unspecified: Secondary | ICD-10-CM | POA: Diagnosis not present

## 2022-10-02 LAB — COMPREHENSIVE METABOLIC PANEL
ALT: 15 U/L (ref 0–35)
AST: 18 U/L (ref 0–37)
Albumin: 4.3 g/dL (ref 3.5–5.2)
Alkaline Phosphatase: 64 U/L (ref 39–117)
BUN: 9 mg/dL (ref 6–23)
CO2: 30 mEq/L (ref 19–32)
Calcium: 9.5 mg/dL (ref 8.4–10.5)
Chloride: 103 mEq/L (ref 96–112)
Creatinine, Ser: 0.7 mg/dL (ref 0.40–1.20)
GFR: 102.55 mL/min (ref >60.00)
Glucose, Bld: 85 mg/dL (ref 70–99)
Potassium: 4 mEq/L (ref 3.5–5.1)
Sodium: 141 mEq/L (ref 135–145)
Total Bilirubin: 0.4 mg/dL (ref 0.2–1.2)
Total Protein: 7 g/dL (ref 6.0–8.3)

## 2022-10-02 LAB — CBC WITH DIFFERENTIAL/PLATELET
Basophils Absolute: 0.1 10*3/uL (ref 0.0–0.1)
Basophils Relative: 1.2 % (ref 0.0–3.0)
Eosinophils Absolute: 0.1 10*3/uL (ref 0.0–0.7)
Eosinophils Relative: 2.2 % (ref 0.0–5.0)
HCT: 38.1 % (ref 36.0–46.0)
Hemoglobin: 12.9 g/dL (ref 12.0–15.0)
Lymphocytes Relative: 30.9 % (ref 12.0–46.0)
Lymphs Abs: 1.6 10*3/uL (ref 0.7–4.0)
MCHC: 33.8 g/dL (ref 30.0–36.0)
MCV: 88.5 fl (ref 78.0–100.0)
Monocytes Absolute: 0.4 10*3/uL (ref 0.1–1.0)
Monocytes Relative: 8.4 % (ref 3.0–12.0)
Neutro Abs: 3 10*3/uL (ref 1.4–7.7)
Neutrophils Relative %: 57.3 % (ref 43.0–77.0)
Platelets: 370 10*3/uL (ref 150.0–400.0)
RBC: 4.31 Mil/uL (ref 3.87–5.11)
RDW: 15.2 % (ref 11.5–15.5)
WBC: 5.2 10*3/uL (ref 4.0–10.5)

## 2022-10-02 LAB — HEMOGLOBIN A1C: Hgb A1c MFr Bld: 5.1 % (ref 4.6–6.5)

## 2022-10-02 LAB — VITAMIN B12: Vitamin B-12: 386 pg/mL (ref 211–911)

## 2022-10-02 LAB — GLUCOSE, POCT (MANUAL RESULT ENTRY): POC Glucose: 92 mg/dl (ref 70–99)

## 2022-10-02 LAB — FOLLICLE STIMULATING HORMONE: FSH: 14.8 m[IU]/mL

## 2022-10-02 NOTE — Assessment & Plan Note (Signed)
Eat high protein and veg 3-5 x a day Check labs today Refer to endo

## 2022-10-02 NOTE — Progress Notes (Signed)
Established Patient Office Visit  Subjective   Patient ID: Tami Mcdonald, female    DOB: 1976-03-19  Age: 47 y.o. MRN: [id number]  Chief Complaint  Patient presents with   Hyperglycemia    Pt states she has been check blood sugars at home and states its been dropping to 25-30. Pt states being fatigued, clammy, dizziness, almost passed out.     HPI Pt is here c/o low blood sugar x 4 days ---- Tuesday, wed, thurs --- she felt light headed and dizziness   blood sugar 25-30 each time.  This all started after fundoplication about 18 years ago.  She has almost passed out---- this am it was 30------ today here it is 85.   Review of Systems  Constitutional:  Negative for fever and malaise/fatigue.  HENT:  Negative for congestion.   Eyes:  Negative for blurred vision.  Respiratory:  Negative for shortness of breath.   Cardiovascular:  Negative for chest pain, palpitations and leg swelling.  Gastrointestinal:  Negative for abdominal pain, blood in stool and nausea.  Genitourinary:  Negative for dysuria and frequency.  Musculoskeletal:  Negative for falls.  Skin:  Negative for rash.  Neurological:  Negative for dizziness, loss of consciousness and headaches.  Endo/Heme/Allergies:  Negative for environmental allergies.  Psychiatric/Behavioral:  Negative for depression. The patient is not nervous/anxious.       Objective:     BP 110/84 (BP Location: Left Arm, Patient Position: Sitting, Cuff Size: Normal)   Pulse 83   Temp 97.9 F (36.6 C) (Oral)   Resp 16   Ht 5\' 4"  (1.626 m)   Wt 152 lb 6.4 oz (69.1 kg)   SpO2 99%   BMI 26.16 kg/m    Physical Exam Vitals and nursing note reviewed.  Constitutional:      Appearance: She is well-developed.  HENT:     Head: Normocephalic and atraumatic.  Eyes:     Conjunctiva/sclera: Conjunctivae normal.  Neck:     Thyroid: No thyromegaly.     Vascular: No carotid bruit or JVD.  Cardiovascular:     Rate and Rhythm: Normal rate and  regular rhythm.     Heart sounds: Normal heart sounds. No murmur heard. Pulmonary:     Effort: Pulmonary effort is normal. No respiratory distress.     Breath sounds: Normal breath sounds. No wheezing or rales.  Chest:     Chest wall: No tenderness.  Musculoskeletal:     Cervical back: Normal range of motion and neck supple.  Neurological:     Mental Status: She is alert and oriented to person, place, and time.  Psychiatric:        Mood and Affect: Mood normal.        Behavior: Behavior normal.        Thought Content: Thought content normal.      Results for orders placed or performed in visit on 10/02/22  POCT glucose (manual entry)  Result Value Ref Range   POC Glucose 92 70 - 99 mg/dl      The 10-year ASCVD risk score (Arnett DK, et al., 2019) is: 0.5%    Assessment & Plan:   Problem List Items Addressed This Visit       Unprioritized   RESOLVED: Hypoglycemia - Primary    Eat high protein and veg 3-5 x a day Check labs today Refer to endo       Relevant Orders   CBC with Differential/Platelet   Comprehensive metabolic  panel   Insulin, random   Fructosamine   Hemoglobin A1c   Vitamin B1   Vitamin B12   Zinc   Ambulatory referral to Endocrinology   RESOLVED: Hyperglycemia   Relevant Orders   POCT glucose (manual entry) (Completed)   Other Visit Diagnoses     Hot flashes       Relevant Orders   Thyroid Panel With TSH   Estradiol   FSH   Vitamin B1   Vitamin B12   Zinc       No follow-ups on file.    Ann Held, DO

## 2022-10-05 LAB — ESTRADIOL: Estradiol: 25 pg/mL

## 2022-10-05 LAB — THYROID PANEL WITH TSH
Free Thyroxine Index: 2.5 (ref 1.4–3.8)
T3 Uptake: 26 % (ref 22–35)
T4, Total: 9.6 ug/dL (ref 5.1–11.9)
TSH: 2.59 mIU/L

## 2022-10-05 LAB — ZINC: Zinc: 69 ug/dL (ref 60–130)

## 2022-10-05 LAB — FRUCTOSAMINE: Fructosamine: 254 umol/L (ref 205–285)

## 2022-10-05 LAB — INSULIN, RANDOM: Insulin: 6 u[IU]/mL

## 2022-10-07 LAB — VITAMIN B1: Vitamin B1 (Thiamine): 25 nmol/L (ref 8–30)

## 2022-10-07 NOTE — Progress Notes (Unsigned)
Follow Up Note  RE: Tami Mcdonald MRN: [id number] DOB: 1976-06-05 Date of Office Visit: 10/08/2022  Referring provider: [name], MD Primary care provider: [name], MD  Chief Complaint: No chief complaint on file.  History of Present Illness: I had the pleasure of seeing Teran Akram for a follow up visit at the [facility] on 10/07/2022. She is a 47 y.o. female, who is being followed for asthma, allergic rhinoconjunctivitis, dyshidrotic eczema and migraines. Her previous allergy office visit was on 04/07/2022 with Dr. Maudie Mercury. Today is a regular follow up visit.  Asthma Past history - Diagnosed with asthma 4 years ago.  Takes Flovent 110 1 puff twice a day and albuterol rare occasions with good benefit.  No prior COVID-19 infection. 2022 spirometry was normal with no improvement in FEV1 post bronchodilator treatment.  Clinically feeling improved. Interim history - well controlled.  Today's spirometry showed some mild restriction. Daily controller medication(s): continue Flovent 169mcg 1 puff twice a day with spacer and rinse mouth afterwards During upper respiratory infections/asthma flares:  Increase Flovent 142mcg 2 puffs twice a day for 1-2 weeks until your breathing symptoms return to baseline.  Pretreat with albuterol 2 puffs. May use albuterol rescue inhaler 2 puffs every 4 to 6 hours as needed for shortness of breath, chest tightness, coughing, and wheezing. May use albuterol rescue inhaler 2 puffs 5 to 15 minutes prior to strenuous physical activities. Monitor frequency of use.  Get spirometry at next visit.   Seasonal and perennial allergic rhinoconjunctivitis Past history - Perennial rhinoconjunctivitis symptoms for 40+ years.  2003 skin testing showed multiple positives and was on AIT for 4 to 5 years with good benefit.  Patient is a Psychologist, clinical.  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 2022 skin testing showed: Positive to grass, trees, mold, cat,  dog, horse and dust mites. Borderline to mixed feathers, mouse, hamster, [place] pig, rabbit, gerbil and rat. Singulair caused mood changes. Interim history - doing better with air filters.  Use over the counter antihistamines such as Zyrtec (cetirizine), Claritin (loratadine), Allegra (fexofenadine), or Xyzal (levocetirizine) daily as needed. May take twice a day during allergy flares. May switch antihistamines every few months. May use Ryaltris (olopatadine + mometasone nasal spray combination) 1-2 sprays per nostril twice a day.  Nasal saline spray (i.e., Simply Saline) or nasal saline lavage (i.e., NeilMed) is recommended as needed and prior to medicated nasal sprays. Consider allergy injections for long term control if above medications do not help the symptoms.  Let us know when ready to start.   Dyshidrotic eczema Controlled. Thinking of getting a tattoo but concerned about possible reaction to it. No prior tattoos. Continue proper skin care.  May use Epicerum twice a day as needed - this is a moisturizer. Use Eucrisa (crisaborole) 2% ointment twice a day on mild rash flares on the face and body. This is a non-steroid ointment. Find out what ingredients are in the tattoo ink. Offered TRUE Patch test (Cobalt sometimes found in blue tattoo ink). May bring a sample of tattoo ink to patch test to as well.    Migraine Past history - Worsening migraines. Followed by neurology. Concerned for food triggers and histamine issues. Noted issues with dairy and gluten flaring her asthma. 2022 skin testing showed: Negative to select foods. Interim history - keto diet helps.  Continue to follow up with neurology.    Return in about 6 months (around 10/08/2022).  Assessment and  Plan: Tami Mcdonald is a 47 y.o. female with: No problem-specific Assessment & Plan notes found for this encounter.  No follow-ups on file.  No orders of the defined types were placed in this encounter.  Lab Orders  No  laboratory test(s) ordered today    Diagnostics: Spirometry:  Tracings reviewed. Her effort: {Blank single:19197::"Good reproducible efforts.","It was hard to get consistent efforts and there is a question as to whether this reflects a maximal maneuver.","Poor effort, data can not be interpreted."} FVC: ***L FEV1: ***L, ***% predicted FEV1/FVC ratio: ***% Interpretation: {Blank single:19197::"Spirometry consistent with mild obstructive disease","Spirometry consistent with moderate obstructive disease","Spirometry consistent with severe obstructive disease","Spirometry consistent with possible restrictive disease","Spirometry consistent with mixed obstructive and restrictive disease","Spirometry uninterpretable due to technique","Spirometry consistent with normal pattern","No overt abnormalities noted given today's efforts"}.  Please see scanned spirometry results for details.  Skin Testing: {Blank single:19197::"Select foods","Environmental allergy panel","Environmental allergy panel and select foods","Food allergy panel","None","Deferred due to recent antihistamines use"}. *** Results discussed with patient/family.   Medication List:  Current Outpatient Medications  Medication Sig Dispense Refill  . ACETAMINOPHEN-BUTALBITAL 50-325 MG TABS TAKE 1 TO 2 TABLETS BY MOUTH EVERY 6 HOURS AS NEEDED 10 tablet 5  . albuterol (VENTOLIN HFA) 108 (90 Base) MCG/ACT inhaler INHALE 2 PUFFS INTO THE LUNGS EVERY 6 (SIX) HOURS AS NEEDED FOR WHEEZING. 8.5 g 1  . b complex vitamins tablet Take by mouth.    . beclomethasone (QVAR REDIHALER) 40 MCG/ACT inhaler Inhale 1-2 puffs into the lungs 2 (two) times daily as needed. 1 each 5  . Botulinum Toxin Type A (BOTOX) 200 units SOLR Inject 155 units IM Into Multiple sites of the face, head and neck every 90 days 1 each 4  . cholecalciferol (VITAMIN D3) 25 MCG (1000 UNIT) tablet Take 1,000 Units by mouth daily.    . Dermatological Products, Misc. [facility]) lotion  Moisturize twice a day on the hands. 225 g 3  . fluticasone (FLONASE) 50 MCG/ACT nasal spray Place into both nostrils daily.    Javier Docker Oil CAPS Take by mouth. Mega red- 1-2 week    . Lactobacillus Rhamnosus, GG, (CULTURELLE PO) Take by mouth.    . Loratadine 10 MG CAPS Claritin    . Multiple Vitamin (MULTIVITAMIN) tablet Take 1 tablet by mouth daily. 2-3 week    . mupirocin ointment (BACTROBAN) 2 % Place 1 application into the nose at bedtime as needed. 22 g 1  . ondansetron (ZOFRAN-ODT) 8 MG disintegrating tablet Take 1 tablet (8 mg total) by mouth every 8 (eight) hours as needed for nausea or vomiting. 20 tablet 3  . tiZANidine (ZANAFLEX) 4 MG tablet tizanidine 4 mg tablet  TAKE 1/2 TO 1 TABLET UP TO THREE TIMES DAILY AS NEEDED.    Marland Kitchen traMADol (ULTRAM) 50 MG tablet TAKE 1 TABLET BY MOUTH EVERY 6 HOURS AS NEEDED 20 tablet 2  . vitamin C (ASCORBIC ACID) 500 MG tablet Take 500 mg by mouth daily. Patient only takes occasionally    . Zavegepant HCl (ZAVZPRET) 10 MG/ACT SOLN Place 10 mg into the nose daily as needed. 6 each 11   No current facility-administered medications for this visit.   Allergies: Allergies  Allergen Reactions  . Nortriptyline Hypertension    Palpitations, high blood pressure  . Other Other (See Comments), Nausea And Vomiting and Rash    Uncoded Allergy. Allergen: tape, tegaderm  . Dairy Aid [Tilactase]     DAIRY  . Gluten Meal     Gluten causes asthma attack  and severe allergy symptoms  . Kiwi Extract   . Singulair [Montelukast]     Mood changes  . Vistaril [Hydroxyzine Hcl] Other (See Comments)    ?reaction type  . Aimovig [Erenumab-Aooe] Rash  . Nurtec [Rimegepant Sulfate] Rash  . Penicillins Rash  . Sulfa Antibiotics Nausea And Vomiting and Rash   I reviewed her past medical history, social history, family history, and environmental history and no significant changes have been reported from her previous visit.  Review of Systems  Constitutional:  Negative  for appetite change, chills, fever and unexpected weight change.  HENT:  Negative for congestion and rhinorrhea.   Eyes:  Negative for itching.  Respiratory:  Negative for cough, chest tightness, shortness of breath and wheezing.   Cardiovascular:  Negative for chest pain.  Gastrointestinal:  Negative for abdominal pain.  Genitourinary:  Negative for difficulty urinating.  Skin:  Negative for rash.  Allergic/Immunologic: Positive for environmental allergies.  Neurological:  Positive for headaches.   Objective: There were no vitals taken for this visit. There is no height or weight on file to calculate BMI. Physical Exam Vitals and nursing note reviewed.  Constitutional:      Appearance: Normal appearance. She is well-developed.  HENT:     Head: Normocephalic and atraumatic.     Right Ear: Tympanic membrane and external ear normal.     Left Ear: Tympanic membrane and external ear normal.     Nose: Nose normal.     Mouth/Throat:     Mouth: Mucous membranes are moist.     Pharynx: Oropharynx is clear.  Eyes:     Conjunctiva/sclera: Conjunctivae normal.  Cardiovascular:     Rate and Rhythm: Normal rate and regular rhythm.     Heart sounds: Normal heart sounds. No murmur heard.    No friction rub. No gallop.  Pulmonary:     Effort: Pulmonary effort is normal.     Breath sounds: Normal breath sounds. No wheezing, rhonchi or rales.  Musculoskeletal:     Cervical back: Neck supple.  Skin:    General: Skin is warm.     Findings: No rash.  Neurological:     Mental Status: She is alert and oriented to person, place, and time.  Psychiatric:        Behavior: Behavior normal.  Previous notes and tests were reviewed. The plan was reviewed with the patient/family, and all questions/concerned were addressed.  It was my pleasure to see Tami Mcdonald today and participate in her care. Please feel free to contact me with any questions or concerns.  Sincerely,  Rexene Alberts, DO Allergy &  Immunology  [facility] office: [place] office: [phone]

## 2022-10-08 ENCOUNTER — Ambulatory Visit: Payer: BC Managed Care – PPO | Admitting: Allergy

## 2022-10-08 ENCOUNTER — Encounter: Payer: Self-pay | Admitting: Allergy

## 2022-10-08 VITALS — BP 118/58 | HR 75 | Resp 16 | Ht 64.0 in | Wt 152.0 lb

## 2022-10-08 DIAGNOSIS — J4540 Moderate persistent asthma, uncomplicated: Secondary | ICD-10-CM | POA: Diagnosis not present

## 2022-10-08 DIAGNOSIS — G43009 Migraine without aura, not intractable, without status migrainosus: Secondary | ICD-10-CM

## 2022-10-08 DIAGNOSIS — J302 Other seasonal allergic rhinitis: Secondary | ICD-10-CM | POA: Diagnosis not present

## 2022-10-08 DIAGNOSIS — L301 Dyshidrosis [pompholyx]: Secondary | ICD-10-CM | POA: Diagnosis not present

## 2022-10-08 DIAGNOSIS — H1010 Acute atopic conjunctivitis, unspecified eye: Secondary | ICD-10-CM

## 2022-10-08 DIAGNOSIS — H1013 Acute atopic conjunctivitis, bilateral: Secondary | ICD-10-CM

## 2022-10-08 MED ORDER — BUDESONIDE-FORMOTEROL FUMARATE 80-4.5 MCG/ACT IN AERO: 2.0000 | INHALATION_SPRAY | Freq: Two times a day (BID) | RESPIRATORY_TRACT | 5 refills | Status: DC

## 2022-10-08 MED ORDER — IPRATROPIUM BROMIDE 0.03 % NA SOLN: 1.0000 | Freq: Two times a day (BID) | NASAL | 5 refills | Status: DC | PRN

## 2022-10-08 NOTE — Patient Instructions (Addendum)
Asthma: Daily controller medication(s): start Symbicort 34mcg 2 puffs twice a day with spacer and rinse mouth afterwards. May use albuterol rescue inhaler 2 puffs every 4 to 6 hours as needed for shortness of breath, chest tightness, coughing, and wheezing. May use albuterol rescue inhaler 2 puffs 5 to 15 minutes prior to strenuous physical activities. Monitor frequency of use.  Asthma control goals:  Full participation in all desired activities (may need albuterol before activity) Albuterol use two times or less a week on average (not counting use with activity) Cough interfering with sleep two times or less a month Oral steroids no more than once a year No hospitalizations   Environmental allergies 2022 skin testing: Positive to grass, trees, mold, cat, dog, horse and dust mites. Borderline to mixed feathers, mouse, hamster, [place] pig, rabbit, gerbil and rat. Continue environmental control measures. Use over the counter antihistamines such as Zyrtec (cetirizine), Claritin (loratadine), Allegra (fexofenadine), or Xyzal (levocetirizine) daily as needed. May take twice a day during allergy flares. May switch antihistamines every few months. Use Atrovent (ipratropium) 0.03% 1-2 sprays per nostril twice a day as needed for runny nose/drainage. Nasal saline spray (i.e., Simply Saline) or nasal saline lavage (i.e., NeilMed) is recommended as needed and prior to medicated nasal sprays. Consider allergy injections for long term control if above medications do not help the symptoms.  Let us know when ready to start.  Atopic dermatitis Continue proper skin care.  May use Epicerum twice a day as needed - this is a moisturizer. Use Eucrisa (crisaborole) 2% ointment twice a day on mild rash flares on the face and body. This is a non-steroid ointment.  If it burns, place the medication in the refrigerator.  Apply a thin layer of moisturizer and then apply the Eucrisa on top of  it.  Migraines/headaches: Continue to follow up with your neurologist  Follow up in 4 months or sooner if needed.

## 2022-10-08 NOTE — Assessment & Plan Note (Signed)
Past history - Worsening migraines. Followed by neurology. Concerned for food triggers and histamine issues. Noted issues with dairy and gluten flaring her asthma. 2022 skin testing showed: Negative to select foods. Continue to follow up with neurology.

## 2022-10-08 NOTE — Assessment & Plan Note (Signed)
Not going to get a tattoo anymore. Continue proper skin care.  May use Epicerum twice a day as needed - this is a moisturizer. Use Eucrisa (crisaborole) 2% ointment twice a day on mild rash flares on the face and body. This is a non-steroid ointment.

## 2022-10-08 NOTE — Assessment & Plan Note (Signed)
Past history - Diagnosed with asthma 4 years ago.  Takes Flovent 110 1 puff twice a day and albuterol rare occasions with good benefit.  No prior COVID-19 infection. 2022 spirometry was normal with no improvement in FEV1 post bronchodilator treatment.  Clinically feeling improved. Interim history - noticed some increased symptoms even with Flovent 125mcg 2 puffs BID. No prednisone.  Today's spirometry was normal. Daily controller medication(s): start Symbicort 10mcg 2 puffs twice a day with spacer and rinse mouth afterwards. May use albuterol rescue inhaler 2 puffs every 4 to 6 hours as needed for shortness of breath, chest tightness, coughing, and wheezing. May use albuterol rescue inhaler 2 puffs 5 to 15 minutes prior to strenuous physical activities. Monitor frequency of use.  Get spirometry at next visit.

## 2022-10-08 NOTE — Assessment & Plan Note (Signed)
Past history - Perennial rhinoconjunctivitis symptoms for 40+ years.  2003 skin testing showed multiple positives and was on AIT for 4 to 5 years with good benefit.  Patient is a Psychologist, clinical.  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 2022 skin testing showed: Positive to grass, trees, mold, cat, dog, horse and dust mites. Borderline to mixed feathers, mouse, hamster, [place] pig, rabbit, gerbil and rat. Singulair caused mood changes. Interim history - steroid nasal sprays cause staph in her nose. Still has PND and symptoms flare when around cats. Not sure about AIT as she has anxiety about this. Not around horses.  Continue environmental control measures. Use over the counter antihistamines such as Zyrtec (cetirizine), Claritin (loratadine), Allegra (fexofenadine), or Xyzal (levocetirizine) daily as needed. May take twice a day during allergy flares. May switch antihistamines every few months. Use Atrovent (ipratropium) 0.03% 1-2 sprays per nostril twice a day as needed for runny nose/drainage. Nasal saline spray (i.e., Simply Saline) or nasal saline lavage (i.e., NeilMed) is recommended as needed and prior to medicated nasal sprays. Consider allergy injections for long term control if above medications do not help the symptoms.  Let us know when ready to start.

## 2022-10-23 ENCOUNTER — Encounter: Payer: Self-pay | Admitting: Family Medicine
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
## 2022-11-16 ENCOUNTER — Encounter: Payer: Self-pay | Admitting: Gastroenterology

## 2022-11-16 ENCOUNTER — Other Ambulatory Visit: Payer: BC Managed Care – PPO

## 2022-11-16 ENCOUNTER — Ambulatory Visit: Payer: BC Managed Care – PPO | Admitting: Gastroenterology

## 2022-11-16 VITALS — BP 118/68 | HR 65 | Ht 64.0 in | Wt 151.0 lb

## 2022-11-16 DIAGNOSIS — R140 Abdominal distension (gaseous): Secondary | ICD-10-CM

## 2022-11-16 DIAGNOSIS — K625 Hemorrhage of anus and rectum: Secondary | ICD-10-CM | POA: Diagnosis not present

## 2022-11-16 DIAGNOSIS — K648 Other hemorrhoids: Secondary | ICD-10-CM

## 2022-11-16 MED ORDER — NA SULFATE-K SULFATE-MG SULF 17.5-3.13-1.6 GM/177ML PO SOLN: 1.0000 | Freq: Once | ORAL | 0 refills | Status: AC

## 2022-11-16 NOTE — Patient Instructions (Signed)
You have been scheduled for a colonoscopy. Please follow written instructions given to you at your visit today.  Please pick up your prep supplies at the pharmacy within the next 1-3 days. If you use inhalers (even only as needed), please bring them with you on the day of your procedure.   Take Benefiber 1 Tablespon 2-3 times a day  Your provider has requested that you go to the basement level for lab work before leaving today. Press "B" on the elevator. The lab is located at the first door on the left as you exit the elevator.   We have sent the following medications to your pharmacy for you to pick up at your convenience: SUPREP  Due to recent changes in healthcare laws, you may see the results of your imaging and laboratory studies on MyChart before your provider has had a chance to review them.  We understand that in some cases there may be results that are confusing or concerning to you. Not all laboratory results come back in the same time frame and the provider may be waiting for multiple results in order to interpret others.  Please give [place] 48 hours in order for your provider to thoroughly review all the results before contacting the office for clarification of your results.    Thank you for choosing [facility]  Karleen Hampshire Nandigam,MD

## 2022-11-17 LAB — GLIADIN ANTIBODIES, SERUM
Gliadin IgA: <1 U/mL
Gliadin IgG: <1 U/mL

## 2022-11-18 LAB — GLIA (IGA/G) + TTG IGA
Antigliadin Abs, IgA: 10 units (ref 0–19)
Gliadin IgG: 4 units (ref 0–19)
Transglutaminase IgA: <2 U/mL (ref 0–3)

## 2022-11-18 LAB — RETICULIN ANTIBODIES, IGA W TITER: Reticulin Ab, IgA: NEGATIVE titer (ref <2.5)

## 2022-11-24 ENCOUNTER — Encounter: Payer: Self-pay | Admitting: Nurse Practitioner

## 2022-12-14 ENCOUNTER — Other Ambulatory Visit: Payer: Self-pay | Admitting: Nurse Practitioner

## 2022-12-14 ENCOUNTER — Encounter: Payer: Self-pay | Admitting: Nurse Practitioner

## 2022-12-14 ENCOUNTER — Ambulatory Visit: Payer: BC Managed Care – PPO | Admitting: Nurse Practitioner

## 2022-12-14 VITALS — BP 124/77 | HR 78 | Ht 64.0 in | Wt 154.6 lb

## 2022-12-14 DIAGNOSIS — E162 Hypoglycemia, unspecified: Secondary | ICD-10-CM | POA: Diagnosis not present

## 2022-12-14 MED ORDER — DEXCOM G7 SENSOR MISC: 1.0000 | 3 refills | Status: DC

## 2022-12-14 NOTE — Patient Instructions (Signed)
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
## 2022-12-17 ENCOUNTER — Encounter: Payer: Self-pay | Admitting: Neurology

## 2022-12-17 ENCOUNTER — Other Ambulatory Visit (HOSPITAL_COMMUNITY): Payer: Self-pay

## 2022-12-17 ENCOUNTER — Encounter: Payer: Self-pay | Admitting: Nurse Practitioner

## 2022-12-17 ENCOUNTER — Telehealth: Payer: Self-pay | Admitting: Pharmacy Technician

## 2022-12-17 NOTE — Telephone Encounter (Signed)
Well darn, thanks for trying.  Tammy, will you let the patient know?  She can use the sample I gave her and then revert back to checking glucose via fingersticks  (4 times daily, before meals and before bed and any other time PRN).

## 2022-12-17 NOTE — Telephone Encounter (Signed)
Pharmacy Patient Advocate Encounter  Received notification from Rx Req msgs/surescripts/LPN that prior authorization for Dexcom G7 Sensors is required/requested.  Per Test Claim: PRIOR AUTH FOR INSULIN USE REQUIRED   Her ins requires that she be on insulin in order for there to get the Dexcom.

## 2022-12-18 DIAGNOSIS — E162 Hypoglycemia, unspecified: Secondary | ICD-10-CM | POA: Diagnosis not present

## 2022-12-19 ENCOUNTER — Encounter: Payer: Self-pay | Admitting: Nurse Practitioner

## 2022-12-24 LAB — BETA-HYDROXYBUTYRATE: Beta-Hydroxybutyric Acid: 0.07 mmol/L

## 2022-12-29 ENCOUNTER — Ambulatory Visit: Payer: BC Managed Care – PPO | Admitting: Nurse Practitioner

## 2022-12-29 DIAGNOSIS — E162 Hypoglycemia, unspecified: Secondary | ICD-10-CM

## 2022-12-30 ENCOUNTER — Encounter: Payer: Self-pay | Admitting: Nurse Practitioner

## 2022-12-31 LAB — COMPREHENSIVE METABOLIC PANEL
AG Ratio: 1.7 (calc) (ref 1.0–2.5)
ALT: 16 U/L (ref 6–29)
AST: 15 U/L (ref 10–35)
Albumin: 4 g/dL (ref 3.6–5.1)
Alkaline phosphatase (APISO): 57 U/L (ref 31–125)
BUN: 13 mg/dL (ref 7–25)
CO2: 27 mmol/L (ref 20–32)
Calcium: 9.3 mg/dL (ref 8.6–10.2)
Chloride: 107 mmol/L (ref 98–110)
Creat: 0.83 mg/dL (ref 0.50–0.99)
Globulin: 2.3 g/dL (calc) (ref 1.9–3.7)
Glucose, Bld: 100 mg/dL — ABNORMAL HIGH (ref 65–99)
Potassium: 3.9 mmol/L (ref 3.5–5.3)
Sodium: 141 mmol/L (ref 135–146)
Total Bilirubin: 0.4 mg/dL (ref 0.2–1.2)
Total Protein: 6.3 g/dL (ref 6.1–8.1)

## 2022-12-31 LAB — INSULIN-LIKE GROWTH FACTOR
IGF-I, LC/MS: 122 ng/mL (ref 52–328)
Z-Score (Female): -0.3 SD (ref ?–2.0)

## 2022-12-31 LAB — GAD65, IA-2, AND INSULIN AUTOANTIBODY SERUM
Glutamic Acid Decarb Ab: <5 IU/mL (ref <5)
IA-2 Antibody: <5.4 U/mL (ref <5.4)
Insulin Antibodies, Human: <0.4 U/mL (ref <0.4)

## 2022-12-31 LAB — C-PEPTIDE: C-Peptide: 1.41 ng/mL (ref 0.80–3.85)

## 2022-12-31 LAB — PROINSULIN: Proinsulin: <4 pmol/L (ref <=18.8)

## 2022-12-31 LAB — INSULIN, FREE (BIOACTIVE): Insulin, Free: 4.6 u[IU]/mL (ref 1.5–14.9)

## 2023-01-05 ENCOUNTER — Other Ambulatory Visit: Payer: Self-pay

## 2023-01-05 ENCOUNTER — Telehealth: Payer: Self-pay | Admitting: Gastroenterology

## 2023-01-05 ENCOUNTER — Encounter: Payer: Self-pay | Admitting: Nurse Practitioner

## 2023-01-05 ENCOUNTER — Encounter: Payer: Self-pay | Admitting: Gastroenterology

## 2023-01-05 ENCOUNTER — Ambulatory Visit: Payer: BC Managed Care – PPO | Admitting: Nurse Practitioner

## 2023-01-05 VITALS — BP 146/74 | HR 84 | Ht 64.0 in | Wt 153.0 lb

## 2023-01-05 DIAGNOSIS — E162 Hypoglycemia, unspecified: Secondary | ICD-10-CM | POA: Diagnosis not present

## 2023-01-05 DIAGNOSIS — K625 Hemorrhage of anus and rectum: Secondary | ICD-10-CM

## 2023-01-05 NOTE — Telephone Encounter (Signed)
Inbound call from patient requesting a follow up on procedure prior authorization scheduled for 5/3 with Dr. Lavon Paganini.
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
## 2023-01-06 ENCOUNTER — Encounter: Payer: Self-pay | Admitting: Gastroenterology

## 2023-01-07 ENCOUNTER — Ambulatory Visit: Payer: BC Managed Care – PPO | Admitting: Neurology

## 2023-01-07 ENCOUNTER — Encounter: Payer: Self-pay | Admitting: Neurology

## 2023-01-07 VITALS — BP 122/70 | HR 74 | Ht 64.0 in | Wt 152.2 lb

## 2023-01-07 DIAGNOSIS — G43109 Migraine with aura, not intractable, without status migrainosus: Secondary | ICD-10-CM | POA: Diagnosis not present

## 2023-01-07 NOTE — Patient Instructions (Signed)
Continue Cefaly

## 2023-01-08 ENCOUNTER — Other Ambulatory Visit: Payer: Self-pay | Admitting: Gastroenterology

## 2023-01-08 ENCOUNTER — Encounter: Payer: Self-pay | Admitting: Gastroenterology

## 2023-01-08 ENCOUNTER — Ambulatory Visit (AMBULATORY_SURGERY_CENTER): Payer: BC Managed Care – PPO | Admitting: Gastroenterology

## 2023-01-08 VITALS — BP 98/71 | HR 63 | Temp 98.5°F | Resp 13 | Ht 64.0 in | Wt 151.0 lb

## 2023-01-08 DIAGNOSIS — D120 Benign neoplasm of cecum: Secondary | ICD-10-CM

## 2023-01-08 DIAGNOSIS — D127 Benign neoplasm of rectosigmoid junction: Secondary | ICD-10-CM | POA: Diagnosis not present

## 2023-01-08 DIAGNOSIS — D128 Benign neoplasm of rectum: Secondary | ICD-10-CM | POA: Diagnosis not present

## 2023-01-08 DIAGNOSIS — K625 Hemorrhage of anus and rectum: Secondary | ICD-10-CM | POA: Diagnosis not present

## 2023-01-08 DIAGNOSIS — D123 Benign neoplasm of transverse colon: Secondary | ICD-10-CM | POA: Diagnosis not present

## 2023-01-08 DIAGNOSIS — K635 Polyp of colon: Secondary | ICD-10-CM | POA: Diagnosis not present

## 2023-01-08 DIAGNOSIS — D125 Benign neoplasm of sigmoid colon: Secondary | ICD-10-CM | POA: Diagnosis not present

## 2023-01-08 MED ORDER — SODIUM CHLORIDE 0.9 % IV SOLN
500.0000 mL | Freq: Once | INTRAVENOUS | Status: DC
Start: 2023-01-08 — End: 2023-01-08

## 2023-01-08 NOTE — Progress Notes (Signed)
Called to room to assist during endoscopic procedure.  Patient ID and intended procedure confirmed with present staff. Received instructions for my participation in the procedure from the performing physician.  

## 2023-01-08 NOTE — Patient Instructions (Signed)
YOU HAD AN ENDOSCOPIC PROCEDURE TODAY AT THE [facility]:   Refer to the procedure report that was given to you for any specific questions about what was found during the examination.  If the procedure report does not answer your questions, please call your gastroenterologist to clarify.  If you requested that your care partner not be given the details of your procedure findings, then the procedure report has been included in a sealed envelope for you to review at your convenience later.  YOU SHOULD EXPECT: Some feelings of bloating in the abdomen. Passage of more gas than usual.  Walking can help get rid of the air that was put into your GI tract during the procedure and reduce the bloating. If you had a lower endoscopy (such as a colonoscopy or flexible sigmoidoscopy) you may notice spotting of blood in your stool or on the toilet paper. If you underwent a bowel prep for your procedure, you may not have a normal bowel movement for a few days.  Please Note:  You might notice some irritation and congestion in your nose or some drainage.  This is from the oxygen used during your procedure.  There is no need for concern and it should clear up in a day or so.  SYMPTOMS TO REPORT IMMEDIATELY:  Following lower endoscopy (colonoscopy or flexible sigmoidoscopy):  Excessive amounts of blood in the stool  Significant tenderness or worsening of abdominal pains  Swelling of the abdomen that is new, acute  Fever of 100F or higher  For urgent or emergent issues, a gastroenterologist can be reached at any hour by calling [phone]. Do not use MyChart messaging for urgent concerns.    DIET:  We do recommend a small meal at first, but then you may proceed to your regular diet.  Drink plenty of fluids but you should avoid alcoholic beverages for 24 hours.  ACTIVITY:  You should plan to take it easy for the rest of today and you should NOT DRIVE or use heavy machinery until tomorrow (because of  the sedation medicines used during the test).    FOLLOW UP: Our staff will call the number listed on your records the next business day following your procedure.  We will call around 7:15- 8:00 am to check on you and address any questions or concerns that you may have regarding the information given to you following your procedure. If we do not reach you, we will leave a message.     If any biopsies were taken you will be contacted by phone or by letter within the next 1-3 weeks.  Please call us at [phone] if you have not heard about the biopsies in 3 weeks.    SIGNATURES/CONFIDENTIALITY: You and/or your care partner have signed paperwork which will be entered into your electronic medical record.  These signatures attest to the fact that that the information above on your After Visit Summary has been reviewed and is understood.  Full responsibility of the confidentiality of this discharge information lies with you and/or your care-partner.  

## 2023-01-08 NOTE — Op Note (Signed)
[facility] Patient Name: Tami Mcdonald Procedure Date: 01/08/2023 8:15 AM MRN: [id number] Endoscopist: Napoleon Form , MD, [id number] Age: 47 Referring MD:  Date of Birth: [birth year] Gender: Female Account #: [id number] Procedure:                Colonoscopy Indications:              Evaluation of unexplained GI bleeding presenting                            with Hematochezia Medicines:                Monitored Anesthesia Care Procedure:                Pre-Anesthesia Assessment:                           - Prior to the procedure, a History and Physical                            was performed, and patient medications and                            allergies were reviewed. The patient's tolerance of                            previous anesthesia was also reviewed. The risks                            and benefits of the procedure and the sedation                            options and risks were discussed with the patient.                            All questions were answered, and informed consent                            was obtained. Prior Anticoagulants: The patient has                            taken no anticoagulant or antiplatelet agents. ASA                            Grade Assessment: II - A patient with mild systemic                            disease. After reviewing the risks and benefits,                            the patient was deemed in satisfactory condition to                            undergo the procedure.  After obtaining informed consent, the colonoscope                            was passed under direct vision. Throughout the                            procedure, the patient's blood pressure, pulse, and                            oxygen saturations were monitored continuously. The                            PCF-HQ190L Colonoscope [id number] was introduced                            through the anus and advanced to  the the cecum,                            identified by appendiceal orifice and ileocecal                            valve. The colonoscopy was performed without                            difficulty. The patient tolerated the procedure                            well. The quality of the bowel preparation was                            good. The ileocecal valve, appendiceal orifice, and                            rectum were photographed. Scope In: 8:33:11 AM Scope Out: 8:58:14 AM Scope Withdrawal Time: 0 hours 17 minutes 19 seconds  Total Procedure Duration: 0 hours 25 minutes 3 seconds  Findings:                 The perianal and digital rectal examinations were                            normal.                           Four sessile polyps were found in the transverse                            colon and cecum. The polyps were 4 to 9 mm in size.                            These polyps were removed with a cold snare.                            Resection and retrieval were complete.  Two pedunculated and semi-pedunculated polyps were                            found in the rectum and sigmoid colon. The polyps                            were 10 to 14 mm in size. These polyps were removed                            with a hot snare. Resection and retrieval were                            complete. To prevent bleeding after the                            polypectomy, one hemostatic clip was successfully                            placed (MR conditional) at polypectomy site in the                            rectum. There was no bleeding at the end of the                            procedure.                           A few small-mouthed diverticula were found in the                            sigmoid colon.                           Non-bleeding internal hemorrhoids were found during                            retroflexion. The hemorrhoids were  medium-sized. Complications:            No immediate complications. Estimated Blood Loss:     Estimated blood loss was minimal. Impression:               - Four 4 to 9 mm polyps in the transverse colon and                            in the cecum, removed with a cold snare. Resected                            and retrieved.                           - Two 10 to 14 mm polyps in the rectum and in the                            sigmoid colon, removed with a hot  snare. Resected                            and retrieved. Clip (MR conditional) was placed.                           - Diverticulosis in the sigmoid colon.                           - Non-bleeding internal hemorrhoids. Recommendation:           - Patient has a contact number available for                            emergencies. The signs and symptoms of potential                            delayed complications were discussed with the                            patient. Return to normal activities tomorrow.                            Written discharge instructions were provided to the                            patient.                           - Resume previous diet.                           - Continue present medications.                           - Await pathology results.                           - Repeat colonoscopy in 3 - 5 years for                            surveillance based on pathology results. Napoleon Form, MD 01/08/2023 9:10:08 AM This report has been signed electronically.

## 2023-01-08 NOTE — Progress Notes (Unsigned)
Uneventful anesthetic. Report to pacu rn. Vss. Care resumed by rn. 
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
## 2023-01-11 ENCOUNTER — Telehealth: Payer: Self-pay | Admitting: *Deleted

## 2023-01-11 NOTE — Telephone Encounter (Signed)
Left message on f/u call 

## 2023-01-12 ENCOUNTER — Ambulatory Visit: Payer: BC Managed Care – PPO | Admitting: Obstetrics and Gynecology
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
## 2023-01-15 ENCOUNTER — Encounter: Payer: Self-pay | Admitting: Gastroenterology

## 2023-01-22 ENCOUNTER — Other Ambulatory Visit: Payer: Self-pay | Admitting: Neurology

## 2023-01-26 ENCOUNTER — Ambulatory Visit (INDEPENDENT_AMBULATORY_CARE_PROVIDER_SITE_OTHER): Payer: BC Managed Care – PPO | Admitting: Obstetrics and Gynecology

## 2023-01-26 ENCOUNTER — Encounter: Payer: Self-pay | Admitting: Obstetrics and Gynecology

## 2023-01-26 VITALS — BP 116/82 | HR 86 | Ht 64.0 in | Wt 149.0 lb

## 2023-01-26 DIAGNOSIS — Z01419 Encounter for gynecological examination (general) (routine) without abnormal findings: Secondary | ICD-10-CM | POA: Diagnosis not present

## 2023-01-26 DIAGNOSIS — N941 Unspecified dyspareunia: Secondary | ICD-10-CM | POA: Diagnosis not present

## 2023-01-26 DIAGNOSIS — N951 Menopausal and female climacteric states: Secondary | ICD-10-CM | POA: Diagnosis not present

## 2023-01-27 NOTE — Patient Instructions (Signed)

## 2023-02-04 ENCOUNTER — Telehealth: Payer: Self-pay

## 2023-02-04 DIAGNOSIS — N941 Unspecified dyspareunia: Secondary | ICD-10-CM

## 2023-02-04 NOTE — Telephone Encounter (Signed)
Yes, it is fine to order the U/S at an external imaging center.

## 2023-02-04 NOTE — Telephone Encounter (Signed)
BS pt calling to report being scheduled for pelvic US w/ office in 03/2023 and would like to be evaluated sooner. Was advised by front desk that she could receive imaging from an external location sooner so is requesting that.  Pelvic US ordered by BS for dyspareunia.  Please advise if ok to place order for pt for external imaging?

## 2023-02-05 NOTE — Telephone Encounter (Signed)
New order placed for external pelvic US and pt notified and provided w/ # for centralized scheduling and advised her to call and schedule [place] at her earliest convenience. She voiced understanding and appreciation.

## 2023-02-12 ENCOUNTER — Ambulatory Visit (HOSPITAL_COMMUNITY): Payer: BC Managed Care – PPO

## 2023-02-22 ENCOUNTER — Other Ambulatory Visit: Payer: Self-pay

## 2023-02-22 ENCOUNTER — Encounter: Payer: Self-pay | Admitting: Gastroenterology

## 2023-02-22 ENCOUNTER — Telehealth: Payer: Self-pay

## 2023-02-22 MED ORDER — HYDROCORTISONE (PERIANAL) 2.5 % EX CREA: 1.0000 | TOPICAL_CREAM | Freq: Two times a day (BID) | CUTANEOUS | 1 refills | Status: DC

## 2023-02-22 NOTE — Telephone Encounter (Signed)
Ok to refill hemorrhoidal cream Sitz baths 2-3 times daily PRN Recticare OTC can help with pain

## 2023-02-22 NOTE — Telephone Encounter (Signed)
DOD This is a patient of Dr Elana Alm who has a history of bleeding hemorrhoids. She had a colonoscopy in May and is scheduled for hemorrhoid banding this Wednesday 02/24/23. Patient has sent a message through My Chart.  "Hello, Thank you for following up. The hemorrhoid is faint red and swollen, and somewhat firm. I believe it is slightly smaller than yesterday. I do not see deep purple as I have seen in previous thrombosed hemorrhoids but I can only see what is protruding, not the entire thing. The pain is about a 5/10, so I suppose it could be trying to thrombose but it's not as painful as it has been in past when thrombosed. No bleeding. I am able to sit in warm water and it helps a little. The preparation H has pramoxine and phenylephrine but no steroid. Thank you! "  Please advise.

## 2023-02-22 NOTE — Telephone Encounter (Signed)
Called the patient and explained the plan of care. Patient agrees to this plan. Rx to [facility] in [facility].

## 2023-02-23 ENCOUNTER — Ambulatory Visit: Payer: BC Managed Care – PPO | Admitting: Allergy

## 2023-02-23 ENCOUNTER — Encounter: Payer: Self-pay | Admitting: Allergy

## 2023-02-23 ENCOUNTER — Other Ambulatory Visit: Payer: Self-pay

## 2023-02-23 VITALS — BP 104/62 | HR 89 | Temp 98.3°F | Resp 14 | Wt 152.0 lb

## 2023-02-23 DIAGNOSIS — J3089 Other allergic rhinitis: Secondary | ICD-10-CM

## 2023-02-23 DIAGNOSIS — L301 Dyshidrosis [pompholyx]: Secondary | ICD-10-CM

## 2023-02-23 DIAGNOSIS — J4540 Moderate persistent asthma, uncomplicated: Secondary | ICD-10-CM

## 2023-02-23 DIAGNOSIS — J302 Other seasonal allergic rhinitis: Secondary | ICD-10-CM | POA: Diagnosis not present

## 2023-02-23 DIAGNOSIS — H1013 Acute atopic conjunctivitis, bilateral: Secondary | ICD-10-CM

## 2023-02-23 DIAGNOSIS — H1010 Acute atopic conjunctivitis, unspecified eye: Secondary | ICD-10-CM

## 2023-02-23 NOTE — Assessment & Plan Note (Signed)
Past history - Diagnosed with asthma 4 years ago.  Takes Flovent 110 1 puff twice a day and albuterol rare occasions with good benefit.  2022 spirometry was normal with no improvement in FEV1 post bronchodilator treatment.  Clinically feeling improved. Interim history - much better with Symbicort.  Today's spirometry was normal - improved from previous one.  Daily controller medication(s): Symbicort 2 puffs twice a day with spacer and rinse mouth afterwards. May use albuterol rescue inhaler 2 puffs every 4 to 6 hours as needed for shortness of breath, chest tightness, coughing, and wheezing. May use albuterol rescue inhaler 2 puffs 5 to 15 minutes prior to strenuous physical activities. Monitor frequency of use.

## 2023-02-23 NOTE — Assessment & Plan Note (Signed)
Past history - Perennial rhinoconjunctivitis symptoms for 40+ years.  2003 skin testing showed multiple positives and was on AIT for 4 to 5 years with good benefit.  Patient is a Administrator, [employer].  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 2022 skin testing showed: Positive to grass, trees, mold, cat, dog, horse and dust mites. Borderline to mixed feathers, mouse, hamster, [place] pig, rabbit, gerbil and rat. Singulair caused mood changes. Interim history - not using nasal sprays anymore.  Continue environmental control measures. Use over the counter antihistamines such as Zyrtec (cetirizine), Claritin (loratadine), Allegra (fexofenadine), or Xyzal (levocetirizine) daily as needed. May take twice a day during allergy flares. May switch antihistamines every few months. Use Atrovent (ipratropium) 0.03% 1-2 sprays per nostril twice a day as needed for runny nose/drainage. Nasal saline spray (i.e., Simply Saline) or nasal saline lavage (i.e., NeilMed) is recommended as needed and prior to medicated nasal sprays. Consider allergy injections for long term control if above medications do not help the symptoms.  Let us know when ready to start.

## 2023-02-23 NOTE — Assessment & Plan Note (Signed)
Improved with warmer weather/hot flashes.  Continue proper skin care.  May use Epicerum twice a day as needed - this is a moisturizer. Use Eucrisa (crisaborole) 2% ointment twice a day on mild rash flares on the face and body. This is a non-steroid ointment.

## 2023-02-23 NOTE — Patient Instructions (Addendum)
Asthma: Daily controller medication(s): Symbicort 2 puffs twice a day with spacer and rinse mouth afterwards. May use albuterol rescue inhaler 2 puffs every 4 to 6 hours as needed for shortness of breath, chest tightness, coughing, and wheezing. May use albuterol rescue inhaler 2 puffs 5 to 15 minutes prior to strenuous physical activities. Monitor frequency of use.  Asthma control goals:  Full participation in all desired activities (may need albuterol before activity) Albuterol use two times or less a week on average (not counting use with activity) Cough interfering with sleep two times or less a month Oral steroids no more than once a year No hospitalizations   Environmental allergies 2022 skin testing: Positive to grass, trees, mold, cat, dog, horse and dust mites. Borderline to mixed feathers, mouse, hamster, [place] pig, rabbit, gerbil and rat. Continue environmental control measures. Use over the counter antihistamines such as Zyrtec (cetirizine), Claritin (loratadine), Allegra (fexofenadine), or Xyzal (levocetirizine) daily as needed. May take twice a day during allergy flares. May switch antihistamines every few months. Use Atrovent (ipratropium) 0.03% 1-2 sprays per nostril twice a day as needed for runny nose/drainage. Nasal saline spray (i.e., Simply Saline) or nasal saline lavage (i.e., NeilMed) is recommended as needed and prior to medicated nasal sprays. Consider allergy injections for long term control if above medications do not help the symptoms.  Let us know when ready to start.  Atopic dermatitis Continue proper skin care.  May use Epicerum twice a day as needed - this is a moisturizer. Use Eucrisa (crisaborole) 2% ointment twice a day on mild rash flares on the face and body. This is a non-steroid ointment.  If it burns, place the medication in the refrigerator.  Apply a thin layer of moisturizer and then apply the Eucrisa on top of  it.  Migraines/headaches: Continue to follow up with your neurologist  Follow up in 6 months or sooner if needed.

## 2023-02-23 NOTE — Progress Notes (Signed)
Follow Up Note  RE: Tami Mcdonald MRN: [id number] DOB: 08-18-1976 Date of Office Visit: 02/23/2023  Referring provider: Bradd Canary, MD Primary care provider: Bradd Canary, MD  Chief Complaint: Follow-up  History of Present Illness: I had the pleasure of seeing Tami Mcdonald for a follow up visit at the [facility] on 02/23/2023. She is a 47 y.o. female, who is being followed for asthma, allergic rhinoconjunctivitis, dyshidrotic eczema. Her previous allergy office visit was on 10/08/2022 with Dr. Selena Batten. Today is a regular follow up visit.  Asthma Currently taking Symbicort 2 puffs twice a day and her breathing has been much better. Used albuterol about 1 month ago during respiratory infection. No prednisone or antibiotics.  Seasonal and perennial allergic rhinoconjunctivitis Takes allegra or Claritin daily. Not using any nasal sprays. Not sure about staring AIT yet due to her work schedule.    Dyshidrotic eczema Improved with warmer weather.    Assessment and Plan: Tami Mcdonald is a 47 y.o. female with: Asthma Past history - Diagnosed with asthma 4 years ago.  Takes Flovent 110 1 puff twice a day and albuterol rare occasions with good benefit.  2022 spirometry was normal with no improvement in FEV1 post bronchodilator treatment.  Clinically feeling improved. Interim history - much better with Symbicort.  Today's spirometry was normal - improved from previous one.  Daily controller medication(s): Symbicort 2 puffs twice a day with spacer and rinse mouth afterwards. May use albuterol rescue inhaler 2 puffs every 4 to 6 hours as needed for shortness of breath, chest tightness, coughing, and wheezing. May use albuterol rescue inhaler 2 puffs 5 to 15 minutes prior to strenuous physical activities. Monitor frequency of use.   Seasonal and perennial allergic rhinoconjunctivitis Past history - Perennial rhinoconjunctivitis symptoms for 40+  years.  2003 skin testing showed multiple positives and was on AIT for 4 to 5 years with good benefit.  Patient is a Administrator, [employer].  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 2022 skin testing showed: Positive to grass, trees, mold, cat, dog, horse and dust mites. Borderline to mixed feathers, mouse, hamster, [place] pig, rabbit, gerbil and rat. Singulair caused mood changes. Interim history - not using nasal sprays anymore.  Continue environmental control measures. Use over the counter antihistamines such as Zyrtec (cetirizine), Claritin (loratadine), Allegra (fexofenadine), or Xyzal (levocetirizine) daily as needed. May take twice a day during allergy flares. May switch antihistamines every few months. Use Atrovent (ipratropium) 0.03% 1-2 sprays per nostril twice a day as needed for runny nose/drainage. Nasal saline spray (i.e., Simply Saline) or nasal saline lavage (i.e., NeilMed) is recommended as needed and prior to medicated nasal sprays. Consider allergy injections for long term control if above medications do not help the symptoms.  Let us know when ready to start.  Dyshidrotic eczema Improved with warmer weather/hot flashes.  Continue proper skin care.  May use Epicerum twice a day as needed - this is a moisturizer. Use Eucrisa (crisaborole) 2% ointment twice a day on mild rash flares on the face and body. This is a non-steroid ointment.   Return in about 6 months (around 08/25/2023).  No orders of the defined types were placed in this encounter.  Lab Orders  No laboratory test(s) ordered today    Diagnostics: Spirometry:  Tracings reviewed. Her effort: Good reproducible efforts. FVC: 3.24L FEV1: 2.59L, 90% predicted FEV1/FVC ratio: 80% Interpretation: Spirometry consistent with normal pattern.  Please see scanned spirometry results for  details.  Medication List:  Current Outpatient Medications  Medication Sig Dispense Refill   ACETAMINOPHEN-BUTALBITAL 50-325 MG TABS TAKE 1 TO 2 TABLETS BY  MOUTH EVERY 6 HOURS AS NEEDED 10 tablet 5   albuterol (VENTOLIN HFA) 108 (90 Base) MCG/ACT inhaler INHALE 2 PUFFS INTO THE LUNGS EVERY 6 (SIX) HOURS AS NEEDED FOR WHEEZING. 8.5 g 1   b complex vitamins tablet Take by mouth.     budesonide-formoterol (SYMBICORT) 80-4.5 MCG/ACT inhaler Inhale 2 puffs into the lungs in the morning and at bedtime. with spacer and rinse mouth afterwards. 1 each 5   cholecalciferol (VITAMIN D3) 25 MCG (1000 UNIT) tablet Take 1,000 Units by mouth daily.     hydrocortisone (ANUSOL-HC) 2.5 % rectal cream Place 1 Application rectally 2 (two) times daily. 30 g 1   Krill Oil CAPS Take by mouth. Mega red- 1-2 week     Lactobacillus Rhamnosus, GG, (CULTURELLE PO) Take by mouth.     Loratadine 10 MG CAPS Claritin     Multiple Vitamin (MULTIVITAMIN) tablet Take 1 tablet by mouth daily. 2-3 week     mupirocin ointment (BACTROBAN) 2 % Place 1 application into the nose at bedtime as needed. 22 g 1   ondansetron (ZOFRAN-ODT) 8 MG disintegrating tablet Take 1 tablet (8 mg total) by mouth every 8 (eight) hours as needed for nausea or vomiting. 20 tablet 3   tiZANidine (ZANAFLEX) 4 MG tablet tizanidine 4 mg tablet  TAKE 1/2 TO 1 TABLET UP TO THREE TIMES DAILY AS NEEDED.     traMADol (ULTRAM) 50 MG tablet TAKE 1 TABLET BY MOUTH EVERY 6 HOURS AS NEEDED 20 tablet 2   vitamin C (ASCORBIC ACID) 500 MG tablet Take 500 mg by mouth daily. Patient only takes occasionally     Continuous Blood Gluc Sensor (DEXCOM G7 SENSOR) MISC Inject 1 Application into the skin as directed. Change sensor every 10 days as directed. (Patient not taking: Reported on 02/23/2023) 9 each 3   Dermatological Products, Misc. [facility]) lotion Moisturize twice a day on the hands. (Patient not taking: Reported on 02/23/2023) 225 g 3   ipratropium (ATROVENT) 0.03 % nasal spray Place 1-2 sprays into both nostrils 2 (two) times daily as needed (nasal drainage). (Patient not taking: Reported on 02/23/2023) 30 mL 5   No current  facility-administered medications for this visit.   Allergies: Allergies  Allergen Reactions   Hydroxyzine Hcl Other (See Comments)    ?reaction type  ?reaction type, Other Reaction: Other reaction   Nortriptyline Hypertension    Palpitations, high blood pressure   Other Other (See Comments), Nausea And Vomiting and Rash    Uncoded Allergy. Allergen: tape, tegaderm   Dairy Aid [Tilactase]     DAIRY   Gluten Meal     Gluten causes asthma attack and severe allergy symptoms   Kiwi Extract    Singulair [Montelukast]     Mood changes   Aimovig [Erenumab-Aooe] Rash   Nurtec [Rimegepant Sulfate] Rash   Penicillins Rash   Sulfa Antibiotics Nausea And Vomiting and Rash   I reviewed her past medical history, social history, family history, and environmental history and no significant changes have been reported from her previous visit.  Review of Systems  Constitutional:  Negative for appetite change, chills, fever and unexpected weight change.  HENT:  Negative for congestion and rhinorrhea.   Eyes:  Negative for itching.  Respiratory:  Negative for cough, chest tightness, shortness of breath and wheezing.   Cardiovascular:  Negative  for chest pain.  Gastrointestinal:  Negative for abdominal pain.  Genitourinary:  Negative for difficulty urinating.  Skin:  Negative for rash.  Allergic/Immunologic: Positive for environmental allergies.  Neurological:  Positive for headaches.    Objective: BP 104/62   Pulse 89   Temp 98.3 F (36.8 C)   Resp 14   Wt 152 lb (68.9 kg)   LMP 12/29/2022   SpO2 98%   BMI 26.09 kg/m  Body mass index is 26.09 kg/m. Physical Exam Vitals and nursing note reviewed.  Constitutional:      Appearance: Normal appearance. She is well-developed.  HENT:     Head: Normocephalic and atraumatic.     Right Ear: Tympanic membrane and external ear normal.     Left Ear: Tympanic membrane and external ear normal.     Nose: Nose normal.     Mouth/Throat:      Mouth: Mucous membranes are moist.     Pharynx: Oropharynx is clear.  Eyes:     Conjunctiva/sclera: Conjunctivae normal.  Cardiovascular:     Rate and Rhythm: Normal rate and regular rhythm.     Heart sounds: Normal heart sounds. No murmur heard.    No friction rub. No gallop.  Pulmonary:     Effort: Pulmonary effort is normal.     Breath sounds: Normal breath sounds. No wheezing, rhonchi or rales.  Musculoskeletal:     Cervical back: Neck supple.  Skin:    General: Skin is warm.     Findings: No rash.  Neurological:     Mental Status: She is alert and oriented to person, place, and time.  Psychiatric:        Behavior: Behavior normal.    Previous notes and tests were reviewed. The plan was reviewed with the patient/family, and all questions/concerned were addressed.  It was my pleasure to see Tami Mcdonald today and participate in her care. Please feel free to contact me with any questions or concerns.  Sincerely,  Wyline Mood, DO Allergy & Immunology  [facility] office: [phone] [facility] office: [phone]

## 2023-02-24 ENCOUNTER — Encounter: Payer: Self-pay | Admitting: Gastroenterology

## 2023-02-24 ENCOUNTER — Ambulatory Visit: Payer: BC Managed Care – PPO | Admitting: Gastroenterology

## 2023-02-24 VITALS — BP 122/76 | HR 100 | Ht 64.0 in | Wt 152.0 lb

## 2023-02-24 DIAGNOSIS — K641 Second degree hemorrhoids: Secondary | ICD-10-CM

## 2023-02-24 NOTE — Progress Notes (Signed)
PROCEDURE NOTE: The patient presents with symptomatic grade II-III  hemorrhoids, requesting rubber band ligation of his/her hemorrhoidal disease.  All risks, benefits and alternative forms of therapy were described and informed consent was obtained.  In the Left Lateral Decubitus position anoscopic examination revealed grade II hemorrhoids in the right anterior, right posterior and left lateral position(s).  The anorectum was pre-medicated with 0.125% nitroglycerin and RectiCare The decision was made to band the Right anterior internal hemorrhoid, and the [facility] O'Regan System was used to perform band ligation without complication.  Digital anorectal examination was then performed to assure proper positioning of the band, and to adjust the banded tissue as required.  The patient was discharged home without pain or other issues.  Dietary and behavioral recommendations were given and along with follow-up instructions.       The patient will return in 4 weeks for  follow-up and possible additional banding as required. No complications were encountered and the patient tolerated the procedure well.  Iona Beard , MD [phone]

## 2023-02-24 NOTE — Patient Instructions (Signed)

## 2023-02-25 ENCOUNTER — Ambulatory Visit: Payer: BC Managed Care – PPO | Admitting: Allergy

## 2023-03-02 ENCOUNTER — Encounter: Payer: Self-pay | Admitting: Allergy

## 2023-03-02 ENCOUNTER — Other Ambulatory Visit: Payer: Self-pay

## 2023-03-02 MED ORDER — ALBUTEROL SULFATE HFA 108 (90 BASE) MCG/ACT IN AERS: INHALATION_SPRAY | RESPIRATORY_TRACT | 1 refills | Status: DC

## 2023-03-08 NOTE — Telephone Encounter (Signed)
External [place] was scheduled for 02/12/2023 (was cancelled).   Pt now scheduled for in-office [place] w/ f/u visit w/ BS on 03/23/2023.   Will route to provider for final review and close encounter.
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
## 2023-03-23 ENCOUNTER — Other Ambulatory Visit: Payer: BC Managed Care – PPO | Admitting: Obstetrics and Gynecology

## 2023-03-23 ENCOUNTER — Ambulatory Visit (INDEPENDENT_AMBULATORY_CARE_PROVIDER_SITE_OTHER): Payer: BC Managed Care – PPO

## 2023-03-23 ENCOUNTER — Other Ambulatory Visit: Payer: BC Managed Care – PPO

## 2023-03-23 DIAGNOSIS — N941 Unspecified dyspareunia: Secondary | ICD-10-CM | POA: Diagnosis not present

## 2023-03-26 ENCOUNTER — Telehealth: Payer: Self-pay | Admitting: Obstetrics and Gynecology

## 2023-03-26 DIAGNOSIS — N83202 Unspecified ovarian cyst, left side: Secondary | ICD-10-CM

## 2023-03-26 NOTE — Telephone Encounter (Signed)
Please contact patient in follow up to her pelvic ultrasound done 03/23/23.   Her uterus is normal.  She has a small complex cyst of her left ovary, measuring 25 x 21 mm, which could be a hemorrhagic cyst or an endometriosis cyst.  Her right ovary is normal.  Usually a follow up ultrasound is done in 6 weeks to recheck the ovarian cyst.  I recommend scheduling at [facility] in 6 weeks.   Please also schedule a follow up appointment with me to review treatment options for her pain.

## 2023-03-29 ENCOUNTER — Encounter: Payer: BC Managed Care – PPO | Admitting: Gastroenterology

## 2023-03-29 NOTE — Telephone Encounter (Signed)
Please give the patient the choice of an office visit before or after her follow up pelvic ultrasound.

## 2023-03-30 NOTE — Telephone Encounter (Signed)
Pt notified and voiced understanding. However, had an additional question if provider felt like her dyspareunia pain/discomfort was coming from the cyst?   Msg sent to appt desk for f/u appt on pain mgmt. Order placed for f/u 6 week [place] w/ outpatient imaging center. Pt provided w/ centralized scheduling #.   Please advise.

## 2023-03-31 ENCOUNTER — Encounter: Payer: Self-pay | Admitting: Nurse Practitioner

## 2023-03-31 NOTE — Telephone Encounter (Signed)
The ovarian cyst is quite small.  Vaginal atrophy from perimenopausal changes (lowering estrogen levels) may be a more likely cause of pelvic pain with intercourse.

## 2023-03-31 NOTE — Telephone Encounter (Signed)
Pt scheduled for OV w/ BS on 04/08/2023.   Scheduled for f/u US on 05/04/2023

## 2023-04-01 NOTE — Telephone Encounter (Signed)
Pt notified and voiced understanding. Will see BS on 8/1. Will route to provider for final review and close.

## 2023-04-02 NOTE — Telephone Encounter (Signed)
Be on the lookout for something from insurance on this patient.

## 2023-04-06 ENCOUNTER — Encounter: Payer: Self-pay | Admitting: Nurse Practitioner

## 2023-04-06 ENCOUNTER — Ambulatory Visit: Payer: BC Managed Care – PPO | Admitting: Nurse Practitioner

## 2023-04-06 VITALS — BP 131/86 | HR 85 | Ht 64.0 in | Wt 155.5 lb

## 2023-04-06 DIAGNOSIS — E162 Hypoglycemia, unspecified: Secondary | ICD-10-CM | POA: Diagnosis not present
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
## 2023-04-07 ENCOUNTER — Telehealth: Payer: Self-pay

## 2023-04-07 ENCOUNTER — Other Ambulatory Visit (HOSPITAL_COMMUNITY): Payer: Self-pay

## 2023-04-07 NOTE — Telephone Encounter (Signed)
Pharmacy Patient Advocate Encounter   Received notification from RX Request Messages that prior authorization for Dexcom G7 sensor is required/requested.   Insurance verification completed.   The patient is insured through [facility] .   Per test claim: PA required; PA submitted to [facility] via Fax Key/confirmation #/[id number] Fax# [phone] Status is pending

## 2023-04-07 NOTE — Telephone Encounter (Signed)
Perfect, thank you

## 2023-04-08 ENCOUNTER — Ambulatory Visit: Payer: BC Managed Care – PPO | Admitting: Obstetrics and Gynecology

## 2023-04-08 ENCOUNTER — Encounter: Payer: Self-pay | Admitting: Obstetrics and Gynecology

## 2023-04-08 VITALS — BP 112/82 | HR 54

## 2023-04-08 DIAGNOSIS — N941 Unspecified dyspareunia: Secondary | ICD-10-CM

## 2023-04-08 DIAGNOSIS — N952 Postmenopausal atrophic vaginitis: Secondary | ICD-10-CM

## 2023-04-08 DIAGNOSIS — N83202 Unspecified ovarian cyst, left side: Secondary | ICD-10-CM | POA: Diagnosis not present

## 2023-04-08 MED ORDER — NONFORMULARY OR COMPOUNDED ITEM
3 refills | Status: AC
Start: 2023-04-08 — End: ?
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
## 2023-04-09 NOTE — Telephone Encounter (Signed)
Pharmacy Patient Advocate Encounter  Received notification from [facility] that Prior Authorization for  Dexcom G7 sensor    has been DENIED. Please advise how you'd like to proceed. Full denial letter will be uploaded to the media tab. See denial reason below.  *Patient is not using insulin to treat diabetes

## 2023-04-09 NOTE — Telephone Encounter (Signed)
No need to appeal.  We discussed that this was a long shot.  I did give her the other option to try Stelo (the OTC Dexcom which should be coming out sometime this month).

## 2023-04-10 ENCOUNTER — Other Ambulatory Visit: Payer: Self-pay | Admitting: Allergy

## 2023-04-10 NOTE — Patient Instructions (Signed)
Atrophic Vaginitis  Atrophic vaginitis is a condition in which the tissues that line the vagina become dry and thin. This condition is most common in women who have stopped having regular menstrual periods (are in menopause). This usually starts when a woman is 94 to 48 years old. That is the time when a woman's estrogen levels begin to decrease. Estrogen is a female hormone. It helps to keep the tissues of the vagina moist. It stimulates the vagina to produce a clear fluid that lubricates the vagina for sex. This fluid also protects the vagina from infection. Lack of estrogen can cause the lining of the vagina to get thinner and dryer. The vagina may also shrink in size. It may become less elastic. Atrophic vaginitis tends to get worse over time as a woman's estrogen level drops. What are the causes? This condition is caused by the normal drop in estrogen that happens around the time of menopause. What increases the risk? Certain conditions or situations may lower a woman's estrogen level, leading to a higher risk for atrophic vaginitis. You are more likely to develop this condition if: You are taking medicines that block estrogen. You have had your ovaries removed. You are being treated for cancer with radiation or medicines (chemotherapy). You have given birth or are breastfeeding. You are older than age 39. You smoke. What are the signs or symptoms? Symptoms of this condition include: Pain, soreness, a feeling of pressure, or bleeding during sex (dyspareunia). Vaginal burning, irritation, or itching. Pain or bleeding when a speculum is used in a vaginal exam. Having burning pain while urinating. Vaginal discharge. In some cases, there are no symptoms. How is this diagnosed? This condition is diagnosed based on your medical history and a physical exam. This will include a pelvic exam that checks the vaginal tissues. Though rare, you may also have other tests, including: A urine test. A  test that checks the acid balance in your vagina (acid balance test). How is this treated? Treatment for this condition depends on how severe your symptoms are. Treatment may include: Using an over-the-counter vaginal lubricant before sex. Using a long-acting vaginal moisturizer. Using low-dose estrogen for moderate to severe symptoms that do not respond to other treatments. Options include creams, tablets, and inserts (vaginal rings). Before you use a vaginal estrogen, tell your health care provider if you have a history of: Breast cancer. Endometrial cancer. Blood clots. If you are not sexually active and your symptoms are very mild, you may not need treatment. Follow these instructions at home: Medicines Take over-the-counter and prescription medicines only as told by your health care provider. Do not use herbal or alternative medicines unless your health care provider says that you can. Use over-the-counter creams, lubricants, or moisturizers for dryness only as told by your health care provider. General instructions If your atrophic vaginitis is caused by menopause, discuss all of your menopause symptoms and treatment options with your health care provider. Do not douche. Do not use products that can make your vagina dry. These include: Scented feminine sprays. Scented tampons. Scented soaps. Vaginal sex can help to improve blood flow and elasticity of vaginal tissue. If you choose to have sex and it hurts, try using a water-soluble lubricant or moisturizer right before having sex. Contact a health care provider if: Your discharge looks different than normal. Your vagina has an unusual smell. You have new symptoms. Your symptoms do not improve with treatment. Your symptoms get worse. Summary Atrophic vaginitis is a condition  in which the tissues that line the vagina become dry and thin. It is most common in women who have stopped having regular menstrual periods (are in  menopause). Treatment options include using vaginal lubricants and low-dose vaginal estrogen. Contact a health care provider if your vagina has an unusual smell, or if your symptoms get worse or do not improve after treatment. This information is not intended to replace advice given to you by your health care provider. Make sure you discuss any questions you have with your health care provider. Document Revised: 02/22/2020 Document Reviewed: 02/22/2020 Elsevier Patient Education  2024 [employer].

## 2023-04-22 ENCOUNTER — Other Ambulatory Visit: Payer: Self-pay | Admitting: Allergy

## 2023-05-04 ENCOUNTER — Ambulatory Visit (HOSPITAL_BASED_OUTPATIENT_CLINIC_OR_DEPARTMENT_OTHER)
Admission: RE | Admit: 2023-05-04 | Discharge: 2023-05-04 | Disposition: A | Discharge status: Home / Self Care | Payer: BC Managed Care – PPO | Source: Ambulatory Visit | Attending: Obstetrics and Gynecology | Admitting: Obstetrics and Gynecology

## 2023-05-04 DIAGNOSIS — N83202 Unspecified ovarian cyst, left side: Secondary | ICD-10-CM | POA: Diagnosis not present

## 2023-05-04 DIAGNOSIS — N83209 Unspecified ovarian cyst, unspecified side: Secondary | ICD-10-CM | POA: Diagnosis not present

## 2023-05-10 DIAGNOSIS — Z20822 Contact with and (suspected) exposure to covid-19: Secondary | ICD-10-CM | POA: Diagnosis not present

## 2023-05-10 DIAGNOSIS — R519 Headache, unspecified: Secondary | ICD-10-CM | POA: Diagnosis not present

## 2023-05-10 DIAGNOSIS — R300 Dysuria: Secondary | ICD-10-CM | POA: Diagnosis not present

## 2023-05-10 DIAGNOSIS — R5383 Other fatigue: Secondary | ICD-10-CM | POA: Diagnosis not present

## 2023-05-10 DIAGNOSIS — R110 Nausea: Secondary | ICD-10-CM | POA: Diagnosis not present

## 2023-05-13 ENCOUNTER — Telehealth: Payer: Self-pay | Admitting: Gastroenterology

## 2023-05-13 ENCOUNTER — Ambulatory Visit (HOSPITAL_COMMUNITY)
Admission: EM | Admit: 2023-05-13 | Discharge: 2023-05-13 | Disposition: A | Discharge status: Home / Self Care | Payer: BC Managed Care – PPO | Attending: Internal Medicine | Admitting: Internal Medicine

## 2023-05-13 ENCOUNTER — Encounter (HOSPITAL_COMMUNITY): Payer: Self-pay | Admitting: Emergency Medicine

## 2023-05-13 ENCOUNTER — Other Ambulatory Visit: Payer: Self-pay

## 2023-05-13 DIAGNOSIS — K645 Perianal venous thrombosis: Secondary | ICD-10-CM

## 2023-05-13 MED ORDER — LIDOCAINE-EPINEPHRINE 1 %-1:100000 IJ SOLN
INTRAMUSCULAR | Status: AC
  Filled 2023-05-13: qty 1

## 2023-05-13 MED ORDER — LIDOCAINE-PRILOCAINE 2.5-2.5 % EX CREA
1.0000 | TOPICAL_CREAM | CUTANEOUS | 0 refills | Status: DC | PRN
Start: 2023-05-13 — End: 2023-08-24

## 2023-05-13 NOTE — Telephone Encounter (Signed)
Called the patient back at the given number. Business number and closed for the day.  Called the listed home number. No answer. No voicemail.

## 2023-05-13 NOTE — Telephone Encounter (Signed)
Inbound call from patient stating recently her hemorrhoids have been very painful and swollen. States started applying hydrocortisone cream that she received before but it has not been much help. States there has been a little bit of bleeding but today it was was more blood when used the restroom. Patient requesting a call back to discuss further and to discuss recommendations. Please advise, thank you.

## 2023-05-13 NOTE — Discharge Instructions (Signed)
Leave packing in place until next bowel movement, may replace with a gauze or just have a pad in undergarment. May use EMLA cream for pain 3-4 times daily. Warm water sitz bathes 3 times daily as needed. Avoid constipation.
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
## 2023-05-13 NOTE — ED Triage Notes (Signed)
History of hemorrhoids.  GI provider has been sceduling the banding of hemorrhoids gradually.  Patient is aware there are 2 remaining.    Patient recently did not feel well.  Patient ended up having constipation and a very hard stool.    Patient has noticed black, dark area to hemorrhoid.  Has noticed slight blood in toilet and is wearing a pantiliner.   Recently was treated for migraine and used several medicines on her med list.  Patient did run a fever.  Not sure if medicines contributed to present issues

## 2023-05-14 NOTE — Telephone Encounter (Signed)
Patient returned your call, please advise. 

## 2023-05-14 NOTE — Telephone Encounter (Signed)
Called the patient. No answer. Left a very brief message of my call on her business voicemail.

## 2023-05-20 NOTE — Telephone Encounter (Signed)
Spoke with the patient. She had 2 thrombosed hemorrhoids lanced last week 9/4. She states she is Healing and feels she has "turned the corner." She was instructed to take sitz bathes, she used Recticare PRN and had analgesics on hand if needed. Appointment here for follow up on 05/26/23 and possible hemorrhoid banding. Patient will keep her appointment.

## 2023-05-21 ENCOUNTER — Encounter: Payer: Self-pay | Admitting: Gastroenterology

## 2023-05-24 DIAGNOSIS — H1789 Other corneal scars and opacities: Secondary | ICD-10-CM | POA: Diagnosis not present

## 2023-05-24 DIAGNOSIS — H5213 Myopia, bilateral: Secondary | ICD-10-CM | POA: Diagnosis not present

## 2023-05-26 ENCOUNTER — Encounter: Payer: Self-pay | Admitting: Gastroenterology

## 2023-05-26 ENCOUNTER — Ambulatory Visit: Payer: BC Managed Care – PPO | Admitting: Gastroenterology

## 2023-05-26 VITALS — BP 110/70 | HR 76 | Ht 64.0 in | Wt 154.0 lb

## 2023-05-26 DIAGNOSIS — K641 Second degree hemorrhoids: Secondary | ICD-10-CM

## 2023-05-26 NOTE — Patient Instructions (Signed)
HEMORRHOID BANDING PROCEDURE  ? ? FOLLOW-UP CARE ? ? ?The procedure you have had should have been relatively painless since the banding of the area involved does not have nerve endings and there is no pain sensation.  The rubber band cuts off the blood supply to the hemorrhoid and the band may fall off as soon as 48 hours after the banding (the band may occasionally be seen in the toilet bowl following a bowel movement). You may notice a temporary feeling of fullness in the rectum which should respond adequately to plain Tylenol? or Motrin?. ? ?Following the banding, avoid strenuous exercise that evening and resume full activity the next day.  A sitz bath (soaking in a warm tub) or bidet is soothing, and can be useful for cleansing the area after bowel movements.   ? ? ?To avoid constipation, take two tablespoons of natural wheat bran, natural oat bran, flax, Benefiber? or any over the counter fiber supplement and increase your water intake to 7-8 glasses daily.   ? ?Unless you have been prescribed anorectal medication, do not put anything inside your rectum for two weeks: No suppositories, enemas, fingers, etc. ? ?Occasionally, you may have more bleeding than usual after the banding procedure.  This is often from the untreated hemorrhoids rather than the treated one.  Don?t be concerned if there is a tablespoon or so of blood.  If there is more blood than this, lie flat with your bottom higher than your head and apply an ice pack to the area. If the bleeding does not stop within a half an hour or if you feel faint, call our office at [phone] or go to the emergency room. ? ?Problems are not common; however, if there is a substantial amount of bleeding, severe pain, chills, fever or difficulty passing urine (very rare) or other problems, you should call us at [phone] or report to the nearest emergency room. ? ?Do not stay seated continuously for more than 2-3 hours for a day or two after the procedure.   Tighten your buttock muscles 10-15 times every two hours and take 10-15 deep breaths every 1-2 hours.  Do not spend more than a few minutes on the toilet if you cannot empty your bowel; instead re-visit the toilet at a later time. ? ? I appreciate the  opportunity to care for you ? ?Thank You  ? ?Harl Bowie , MD  ?

## 2023-05-26 NOTE — Progress Notes (Unsigned)
PROCEDURE NOTE: The patient presents with symptomatic grade II  hemorrhoids, requesting rubber band ligation of his/her hemorrhoidal disease.  All risks, benefits and alternative forms of therapy were described and informed consent was obtained.  In the Left Lateral Decubitus position anoscopic examination revealed grade II hemorrhoids in the right posterior and right anterior position(s).  Thrombosed hemorrhoids in the right posterior, s/p lancing with improvement, has persistent small thrombus in the right posterior position with mild tenderness The anorectum was pre-medicated with 0.125% nitroglycerin and RectiCare The decision was made to band the right anterior internal hemorrhoid, and the [facility] O'Regan System was used to perform band ligation without complication.  Digital anorectal examination was then performed to assure proper positioning of the band, and to adjust the banded tissue as required.  The patient was discharged home without pain or other issues.  Dietary and behavioral recommendations were given and along with follow-up instructions.     The following adjunctive treatments were recommended:  Continue Benefiber 1 tablespoon 2-3 times daily with meals Use hydrocortisone suppository twice daily starting in 3 days and continue for 2 to 3 days Avoid excessive straining during defecation  The patient will return in 4-6 weeks for  follow-up and possible additional banding as required. No complications were encountered and the patient tolerated the procedure well.  Iona Beard , MD [phone]

## 2023-05-27 ENCOUNTER — Encounter: Payer: Self-pay | Admitting: Gastroenterology

## 2023-05-27 ENCOUNTER — Other Ambulatory Visit: Payer: Self-pay | Admitting: Neurology

## 2023-05-28 ENCOUNTER — Other Ambulatory Visit: Payer: Self-pay

## 2023-05-28 MED ORDER — HYDROCORTISONE ACETATE 25 MG RE SUPP
25.0000 mg | Freq: Two times a day (BID) | RECTAL | 0 refills | Status: AC
Start: 2023-05-28 — End: 2023-05-31

## 2023-06-14 ENCOUNTER — Other Ambulatory Visit: Payer: Self-pay | Admitting: Radiology

## 2023-06-14 DIAGNOSIS — Z1231 Encounter for screening mammogram for malignant neoplasm of breast: Secondary | ICD-10-CM

## 2023-06-25 ENCOUNTER — Encounter: Payer: Self-pay | Admitting: Family Medicine

## 2023-06-25 ENCOUNTER — Other Ambulatory Visit: Payer: Self-pay | Admitting: Family Medicine

## 2023-06-25 MED ORDER — MUPIROCIN 2 % EX OINT: 1.0000 | TOPICAL_OINTMENT | Freq: Every evening | CUTANEOUS | 1 refills | Status: AC | PRN

## 2023-06-29 ENCOUNTER — Ambulatory Visit: Payer: BC Managed Care – PPO | Admitting: Gastroenterology

## 2023-06-29 ENCOUNTER — Encounter: Payer: Self-pay | Admitting: Gastroenterology

## 2023-06-29 VITALS — BP 110/78 | HR 82 | Ht 64.0 in | Wt 155.0 lb

## 2023-06-29 DIAGNOSIS — K641 Second degree hemorrhoids: Secondary | ICD-10-CM

## 2023-06-29 NOTE — Progress Notes (Signed)
PROCEDURE NOTE: The patient presents with symptomatic grade II  hemorrhoids, requesting rubber band ligation of his/her hemorrhoidal disease.  All risks, benefits and alternative forms of therapy were described and informed consent was obtained.   The anorectum was pre-medicated with 0.125% NTG and Recticare The decision was made to band the left lateral internal hemorrhoid, and the [facility] O'Regan System was used to perform band ligation without complication.  Digital anorectal examination was then performed to assure proper positioning of the band, and to adjust the banded tissue as required.  The patient was discharged home without pain or other issues.  Dietary and behavioral recommendations were given and along with follow-up instructions.     The following adjunctive treatments were recommended:  Benefiber 1 tablespoon BID with meals  The patient will return for  follow-up and possible additional banding as needed. No complications were encountered and the patient tolerated the procedure well.  Iona Beard , MD [phone]

## 2023-06-29 NOTE — Patient Instructions (Signed)
HEMORRHOID BANDING PROCEDURE  ? ? FOLLOW-UP CARE ? ? ?The procedure you have had should have been relatively painless since the banding of the area involved does not have nerve endings and there is no pain sensation.  The rubber band cuts off the blood supply to the hemorrhoid and the band may fall off as soon as 48 hours after the banding (the band may occasionally be seen in the toilet bowl following a bowel movement). You may notice a temporary feeling of fullness in the rectum which should respond adequately to plain Tylenol? or Motrin?. ? ?Following the banding, avoid strenuous exercise that evening and resume full activity the next day.  A sitz bath (soaking in a warm tub) or bidet is soothing, and can be useful for cleansing the area after bowel movements.   ? ? ?To avoid constipation, take two tablespoons of natural wheat bran, natural oat bran, flax, Benefiber? or any over the counter fiber supplement and increase your water intake to 7-8 glasses daily.   ? ?Unless you have been prescribed anorectal medication, do not put anything inside your rectum for two weeks: No suppositories, enemas, fingers, etc. ? ?Occasionally, you may have more bleeding than usual after the banding procedure.  This is often from the untreated hemorrhoids rather than the treated one.  Don?t be concerned if there is a tablespoon or so of blood.  If there is more blood than this, lie flat with your bottom higher than your head and apply an ice pack to the area. If the bleeding does not stop within a half an hour or if you feel faint, call our office at [phone] or go to the emergency room. ? ?Problems are not common; however, if there is a substantial amount of bleeding, severe pain, chills, fever or difficulty passing urine (very rare) or other problems, you should call us at [phone] or report to the nearest emergency room. ? ?Do not stay seated continuously for more than 2-3 hours for a day or two after the procedure.   Tighten your buttock muscles 10-15 times every two hours and take 10-15 deep breaths every 1-2 hours.  Do not spend more than a few minutes on the toilet if you cannot empty your bowel; instead re-visit the toilet at a later time. ? ? I appreciate the  opportunity to care for you ? ?Thank You  ? ?Harl Bowie , MD  ?

## 2023-07-15 ENCOUNTER — Ambulatory Visit
Admission: RE | Admit: 2023-07-15 | Discharge: 2023-07-15 | Disposition: A | Discharge status: Home / Self Care | Payer: BC Managed Care – PPO | Source: Ambulatory Visit | Attending: Radiology | Admitting: Radiology

## 2023-07-15 DIAGNOSIS — Z1231 Encounter for screening mammogram for malignant neoplasm of breast: Secondary | ICD-10-CM
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
## 2023-07-20 ENCOUNTER — Ambulatory Visit: Payer: BC Managed Care – PPO | Admitting: Neurology

## 2023-07-20 ENCOUNTER — Encounter: Payer: Self-pay | Admitting: Neurology

## 2023-07-20 VITALS — BP 127/77 | HR 80 | Ht 64.0 in | Wt 150.0 lb

## 2023-07-20 DIAGNOSIS — G43709 Chronic migraine without aura, not intractable, without status migrainosus: Secondary | ICD-10-CM | POA: Diagnosis not present

## 2023-07-20 MED ORDER — EMGALITY 120 MG/ML ~~LOC~~ SOAJ: 240.0000 mg | Freq: Once | SUBCUTANEOUS | 0 refills | Status: AC

## 2023-07-20 NOTE — Patient Instructions (Signed)
Start Emgality - 2 injections for first dose.  Then 1 injection every 28 days thereafter.  Make sure first dose is 2 pens.  Let me know when you pick it up and I will send in standing order At earliest onset of migraine, use Zavzpret nasal spray.  Blow nose.  Flex neck so looking down and spray in one nostril (maximum 1 dose in 24 hours).  Let me know if you like it.

## 2023-07-21 ENCOUNTER — Encounter: Payer: Self-pay | Admitting: Neurology

## 2023-07-21 ENCOUNTER — Telehealth: Payer: Self-pay

## 2023-07-21 NOTE — Telephone Encounter (Signed)
Medication Samples have been provided to the patient.  Drug name: zavzpret nasal spray       Strength: 10mg         Qty: 1  LOT: [id number]  Exp.Date: 2026-01  Dosing instructions: as needed for migraine   The patient has been instructed regarding the correct time, dose, and frequency of taking this medication, including desired effects and most common side effects.   [name] 8:35 AM 07/21/2023

## 2023-07-23 ENCOUNTER — Encounter: Payer: Self-pay | Admitting: Gastroenterology

## 2023-07-23 ENCOUNTER — Ambulatory Visit: Payer: BC Managed Care – PPO | Admitting: Gastroenterology

## 2023-07-23 VITALS — BP 110/70 | HR 70 | Ht 64.0 in | Wt 157.0 lb

## 2023-07-23 DIAGNOSIS — K641 Second degree hemorrhoids: Secondary | ICD-10-CM | POA: Diagnosis not present

## 2023-07-23 NOTE — Progress Notes (Signed)
PROCEDURE NOTE: The patient presents with symptomatic grade II  hemorrhoids, requesting rubber band ligation of his/her hemorrhoidal disease.  All risks, benefits and alternative forms of therapy were described and informed consent was obtained.  In the Left Lateral Decubitus position anoscopic examination revealed grade II hemorrhoids in the Right posterior position(s).  The anorectum was pre-medicated with 0.125% NTG and Recticare The decision was made to band the right posterior internal hemorrhoid, and the [facility] O'Regan System was used to perform band ligation without complication.  Digital anorectal examination was then performed to assure proper positioning of the band, and to adjust the banded tissue as required.  The patient was discharged home without pain or other issues.  Dietary and behavioral recommendations were given and along with follow-up instructions.     The following adjunctive treatments were recommended:  Soluble fiber 1 tablespoon TID with meals   No complications were encountered and the patient tolerated the procedure well.  Iona Beard , MD [phone]

## 2023-07-23 NOTE — Patient Instructions (Signed)
HEMORRHOID BANDING PROCEDURE  ? ? FOLLOW-UP CARE ? ? ?The procedure you have had should have been relatively painless since the banding of the area involved does not have nerve endings and there is no pain sensation.  The rubber band cuts off the blood supply to the hemorrhoid and the band may fall off as soon as 48 hours after the banding (the band may occasionally be seen in the toilet bowl following a bowel movement). You may notice a temporary feeling of fullness in the rectum which should respond adequately to plain Tylenol? or Motrin?. ? ?Following the banding, avoid strenuous exercise that evening and resume full activity the next day.  A sitz bath (soaking in a warm tub) or bidet is soothing, and can be useful for cleansing the area after bowel movements.   ? ? ?To avoid constipation, take two tablespoons of natural wheat bran, natural oat bran, flax, Benefiber? or any over the counter fiber supplement and increase your water intake to 7-8 glasses daily.   ? ?Unless you have been prescribed anorectal medication, do not put anything inside your rectum for two weeks: No suppositories, enemas, fingers, etc. ? ?Occasionally, you may have more bleeding than usual after the banding procedure.  This is often from the untreated hemorrhoids rather than the treated one.  Don?t be concerned if there is a tablespoon or so of blood.  If there is more blood than this, lie flat with your bottom higher than your head and apply an ice pack to the area. If the bleeding does not stop within a half an hour or if you feel faint, call our office at [phone] or go to the emergency room. ? ?Problems are not common; however, if there is a substantial amount of bleeding, severe pain, chills, fever or difficulty passing urine (very rare) or other problems, you should call us at [phone] or report to the nearest emergency room. ? ?Do not stay seated continuously for more than 2-3 hours for a day or two after the procedure.   Tighten your buttock muscles 10-15 times every two hours and take 10-15 deep breaths every 1-2 hours.  Do not spend more than a few minutes on the toilet if you cannot empty your bowel; instead re-visit the toilet at a later time. ? ? I appreciate the  opportunity to care for you ? ?Thank You  ? ?Harl Bowie , MD  ?

## 2023-07-30 ENCOUNTER — Telehealth: Payer: Self-pay | Admitting: Allergy

## 2023-07-30 MED ORDER — BUDESONIDE-FORMOTEROL FUMARATE 80-4.5 MCG/ACT IN AERO: 2.0000 | INHALATION_SPRAY | Freq: Two times a day (BID) | RESPIRATORY_TRACT | 1 refills | Status: DC

## 2023-07-30 NOTE — Telephone Encounter (Signed)
Symbicort inhaler has been sent into the [facility]. I left a message for the patient to call with any questions or concerns.

## 2023-07-30 NOTE — Telephone Encounter (Signed)
Pt request refill for symbicort, pt is scheduled for an appt. On 12/17.

## 2023-08-16 ENCOUNTER — Encounter: Payer: Self-pay | Admitting: Nurse Practitioner

## 2023-08-16 ENCOUNTER — Ambulatory Visit: Payer: BC Managed Care – PPO | Admitting: Nurse Practitioner

## 2023-08-16 VITALS — BP 126/81 | HR 72 | Ht 64.0 in | Wt 155.2 lb

## 2023-08-16 DIAGNOSIS — E162 Hypoglycemia, unspecified: Secondary | ICD-10-CM
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
## 2023-08-17 ENCOUNTER — Other Ambulatory Visit: Payer: Self-pay | Admitting: Nurse Practitioner

## 2023-08-17 NOTE — Addendum Note (Signed)
Addended by: Dani Gobble on: 08/17/2023 07:20 AM   Modules accepted: Orders

## 2023-08-18 DIAGNOSIS — L929 Granulomatous disorder of the skin and subcutaneous tissue, unspecified: Secondary | ICD-10-CM | POA: Diagnosis not present

## 2023-08-18 DIAGNOSIS — B958 Unspecified staphylococcus as the cause of diseases classified elsewhere: Secondary | ICD-10-CM | POA: Diagnosis not present

## 2023-08-18 DIAGNOSIS — J340 Abscess, furuncle and carbuncle of nose: Secondary | ICD-10-CM | POA: Diagnosis not present

## 2023-08-22 ENCOUNTER — Other Ambulatory Visit: Payer: Self-pay | Admitting: Allergy

## 2023-08-23 ENCOUNTER — Encounter: Payer: BC Managed Care – PPO | Admitting: Family Medicine

## 2023-08-24 ENCOUNTER — Telehealth: Payer: Self-pay

## 2023-08-24 ENCOUNTER — Ambulatory Visit: Payer: BC Managed Care – PPO | Admitting: Allergy

## 2023-08-24 ENCOUNTER — Encounter: Payer: Self-pay | Admitting: Gastroenterology

## 2023-08-24 ENCOUNTER — Encounter: Payer: Self-pay | Admitting: Allergy

## 2023-08-24 ENCOUNTER — Encounter: Payer: Self-pay | Admitting: Neurology

## 2023-08-24 VITALS — BP 116/70 | HR 74 | Temp 98.1°F | Resp 18

## 2023-08-24 DIAGNOSIS — H1010 Acute atopic conjunctivitis, unspecified eye: Secondary | ICD-10-CM

## 2023-08-24 DIAGNOSIS — J4540 Moderate persistent asthma, uncomplicated: Secondary | ICD-10-CM | POA: Diagnosis not present

## 2023-08-24 DIAGNOSIS — J302 Other seasonal allergic rhinitis: Secondary | ICD-10-CM | POA: Diagnosis not present

## 2023-08-24 DIAGNOSIS — L301 Dyshidrosis [pompholyx]: Secondary | ICD-10-CM | POA: Diagnosis not present

## 2023-08-24 DIAGNOSIS — J3089 Other allergic rhinitis: Secondary | ICD-10-CM

## 2023-08-24 MED ORDER — BUDESONIDE-FORMOTEROL FUMARATE 80-4.5 MCG/ACT IN AERO: 2.0000 | INHALATION_SPRAY | Freq: Two times a day (BID) | RESPIRATORY_TRACT | 5 refills | Status: DC

## 2023-08-24 NOTE — Telephone Encounter (Signed)
PA needed for Emgality.

## 2023-08-24 NOTE — Patient Instructions (Addendum)
Asthma: Normal breathing test today. Daily controller medication(s): Symbicort 2 puffs twice a day with spacer and rinse mouth afterwards. May use albuterol rescue inhaler 2 puffs every 4 to 6 hours as needed for shortness of breath, chest tightness, coughing, and wheezing.  Monitor frequency of use - if you need to use it more than twice per week on a consistent basis let us know.  Asthma control goals:  Full participation in all desired activities (may need albuterol before activity) Albuterol use two times or less a week on average (not counting use with activity) Cough interfering with sleep two times or less a month Oral steroids no more than once a year No hospitalizations   Environmental allergies 2022 skin testing positive to grass, trees, mold, cat, dog, horse and dust mites. Borderline to mixed feathers, mouse, hamster, [place] pig, rabbit, gerbil and rat. Continue environmental control measures. Use over the counter antihistamines such as Zyrtec (cetirizine), Claritin (loratadine), Allegra (fexofenadine), or Xyzal (levocetirizine) daily as needed. May take twice a day during allergy flares. May switch antihistamines every few months. Use Atrovent (ipratropium) 0.03% 1-2 sprays per nostril twice a day as needed for runny nose/drainage. Use Flonase (fluticasone) nasal spray 1-2 sprays per nostril once a day as needed for nasal congestion.  Nasal saline spray (i.e., Simply Saline) or nasal saline lavage (i.e., NeilMed) is recommended as needed and prior to medicated nasal sprays. Start allergy injections - 2 shots. Let us know when ready to start.  Had a detailed discussion with patient/family that clinical history is suggestive of allergic rhinitis, and may benefit from allergy immunotherapy (AIT). Discussed in detail regarding the dosing, schedule, side effects (mild to moderate local allergic reaction and rarely systemic allergic reactions including anaphylaxis), and benefits  (significant improvement in nasal symptoms, seasonal flares of asthma) of immunotherapy with the patient. There is significant time commitment involved with allergy shots, which includes weekly immunotherapy injections for first 9-12 months and then biweekly to monthly injections for 3-5 years. Consent was signed. I will need to send in epinephrine device once starting allergy injections.   Atopic dermatitis Continue proper skin care.  May use Epicerum twice a day as needed - this is a moisturizer. Use Eucrisa (crisaborole) 2% ointment twice a day on mild rash flares on the face and body. This is a non-steroid ointment.  If it burns, place the medication in the refrigerator.  Apply a thin layer of moisturizer and then apply the Eucrisa on top of it.  Migraines/headaches: Continue to follow up with your neurologist  Follow up in 6 months or sooner if needed.

## 2023-08-24 NOTE — Progress Notes (Signed)
Follow Up Note  RE: MONALEE THACKERAY MRN: [id number] DOB: [birth year] Date of Office Visit: 08/24/2023  Referring provider: Bradd Canary, MD Primary care provider: Bradd Canary, MD  Chief Complaint: Follow-up  History of Present Illness: I had the pleasure of seeing Tami Mcdonald for a follow up visit at the [facility] on 08/24/2023. She is a 47 y.o. female, who is being followed for asthma, allergic rhinoconjunctivitis and dyshidrotic eczema. Her previous allergy office visit was on 02/23/2023 with Dr. Selena Batten. Today is a regular follow up visit.  Discussed the use of AI scribe software for clinical note transcription with the patient, who gave verbal consent to proceed.  The patient, with a history of asthma, allergies, and migraines, reports that her asthma has been well-controlled with Symbicort 80 mcg twice daily. She denies any recent exacerbations, use of rescue inhaler, or need for systemic steroids.   Her allergies have been managed with daily Claritin, and she has not needed to use a nasal spray since her last visit. She reports occasional nasal stuffiness. She has previously undergone allergy shots, which she found beneficial, but has been hesitant to restart due to the time commitment and previous adverse reactions.  The patient also has a history of migraines, for which she is under the care of a neurologist. She is considering starting a new injectable migraine medication, pending insurance approval. 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  Her skin has been slightly dry due to the winter season, but she has been managing it with heavy doses of plain lotion. She has a prescription for Eucrisa but has not needed to use it recently.     Assessment and Plan: Navina is a 47 y.o. female with: Asthma Past history - Diagnosed with asthma 4 years ago.  Takes Flovent 110 1 puff twice a day and albuterol rare occasions with good  benefit.  2022 spirometry was normal with no improvement in FEV1 post bronchodilator treatment.  Clinically feeling improved. Interim history - Well controlled with Symbicort 80 mcg twice daily. No recent use of rescue inhaler or need for systemic steroids. No recent ER or urgent care visits. Today's spirometry was normal. Daily controller medication(s): Symbicort 2 puffs twice a day with spacer and rinse mouth afterwards. May use albuterol rescue inhaler 2 puffs every 4 to 6 hours as needed for shortness of breath, chest tightness, coughing, and wheezing.  Monitor frequency of use - if you need to use it more than twice per week on a consistent basis let us know.    Seasonal and perennial allergic rhinoconjunctivitis Past history - Perennial rhinoconjunctivitis symptoms for 40+ years.  2003 skin testing showed multiple positives and was on AIT for 4 to 5 years with good benefit.  Patient is a Administrator, [employer].  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 2022 skin testing showed: Positive to grass, trees, mold, cat, dog, horse and dust mites. Borderline to mixed feathers, mouse, hamster, [place] pig, rabbit, gerbil and rat. Singulair caused mood changes. Interim history - Symptoms controlled with daily Claritin. No recent use of nasal spray. Noted nasal stuffiness during visit. Continue environmental control measures. Use over the counter antihistamines such as Zyrtec (cetirizine), Claritin (loratadine), Allegra (fexofenadine), or Xyzal (levocetirizine) daily as needed. May take twice a day during allergy flares. May switch antihistamines every few months. Use Atrovent (ipratropium) 0.03% 1-2 sprays per nostril twice a day as needed for runny nose/drainage. Use Flonase (fluticasone) nasal  spray 1-2 sprays per nostril once a day as needed for nasal congestion.  Nasal saline spray (i.e., Simply Saline) or nasal saline lavage (i.e., NeilMed) is recommended as needed and prior to medicated nasal sprays. Start allergy  injections - 2 shots, will add horse as patient is a Administrator, [employer]. Let us know when ready to start.  Had a detailed discussion with patient/family that clinical history is suggestive of allergic rhinitis, and may benefit from allergy immunotherapy (AIT). Discussed in detail regarding the dosing, schedule, side effects (mild to moderate local allergic reaction and rarely systemic allergic reactions including anaphylaxis), and benefits (significant improvement in nasal symptoms, seasonal flares of asthma) of immunotherapy with the patient. There is significant time commitment involved with allergy shots, which includes weekly immunotherapy injections for first 9-12 months and then biweekly to monthly injections for 3-5 years. Consent was signed. I will need to send in epinephrine device once starting allergy injections.    Dyshidrotic eczema Stable.  Continue proper skin care.  May use Epicerum twice a day as needed - this is a moisturizer. Use Eucrisa (crisaborole) 2% ointment twice a day on mild rash flares on the face and body. This is a non-steroid ointment.   Migraines/headaches Continue to follow up with your neurologist  Return in about 6 months (around 02/22/2024).  Meds ordered this encounter  Medications   budesonide-formoterol (SYMBICORT) 80-4.5 MCG/ACT inhaler    Sig: Inhale 2 puffs into the lungs in the morning and at bedtime. with spacer and rinse mouth afterwards.    Dispense:  1 each    Refill:  5   Lab Orders  No laboratory test(s) ordered today    Diagnostics: Spirometry:  Tracings reviewed. Her effort: Good reproducible efforts. FVC: 3.30L FEV1: 2.69L, 94% predicted FEV1/FVC ratio: 82% Interpretation: Spirometry consistent with normal pattern.  Please see scanned spirometry results for details.  Medication List:  Current Outpatient Medications  Medication Sig Dispense Refill   ACETAMINOPHEN-BUTALBITAL 50-325 MG TABS TAKE 1 TO 2 TABLETS BY MOUTH EVERY 6 HOURS AS NEEDED 10  tablet 5   albuterol (VENTOLIN HFA) 108 (90 Base) MCG/ACT inhaler INHALE 2 PUFFS INTO THE LUNGS EVERY 6 HOURS AS NEEDED FOR WHEEZE 8.5 each 1   b complex vitamins tablet Take by mouth.     budesonide-formoterol (SYMBICORT) 80-4.5 MCG/ACT inhaler Inhale 2 puffs into the lungs in the morning and at bedtime. with spacer and rinse mouth afterwards. 1 each 5   cholecalciferol (VITAMIN D3) 25 MCG (1000 UNIT) tablet Take 1,000 Units by mouth daily.     Continuous Blood Gluc Sensor (DEXCOM G7 SENSOR) MISC Inject 1 Application into the skin as directed. Change sensor every 10 days as directed. 9 each 3   hydrocortisone (ANUSOL-HC) 2.5 % rectal cream Place 1 Application rectally 2 (two) times daily. 30 g 1   Krill Oil CAPS Take by mouth. Mega red- 1-2 week     Lactobacillus Rhamnosus, GG, (CULTURELLE PO) Take by mouth.     Loratadine 10 MG CAPS Claritin     Multiple Vitamin (MULTIVITAMIN) tablet Take 1 tablet by mouth daily. 2-3 week     mupirocin ointment (BACTROBAN) 2 % Place 1 Application into the nose at bedtime as needed. 22 g 1   NONFORMULARY OR COMPOUNDED ITEM Vaginal vitamin E suppository, 200 international units, place one in the vagina at night time twice a week.  Disp:  24 suppositories,  RF:  3. (Patient taking differently: as needed. Vaginal vitamin E suppository, 200 international units,  place one in the vagina at night time twice a week.  Disp:  24 suppositories,  RF:  3.) 30 each 3   ondansetron (ZOFRAN-ODT) 8 MG disintegrating tablet Take 1 tablet (8 mg total) by mouth every 8 (eight) hours as needed for nausea or vomiting. 20 tablet 3   tiZANidine (ZANAFLEX) 4 MG tablet tizanidine 4 mg tablet  TAKE 1/2 TO 1 TABLET UP TO THREE TIMES DAILY AS NEEDED.     traMADol (ULTRAM) 50 MG tablet Take 1 tablet (50 mg total) by mouth every 6 (six) hours as needed. 20 tablet 2   vitamin C (ASCORBIC ACID) 500 MG tablet Take 500 mg by mouth daily. Patient only takes occasionally     Dermatological Products,  Misc. [facility]) lotion Moisturize twice a day on the hands. (Patient not taking: Reported on 08/24/2023) 225 g 3   gabapentin (NEURONTIN) 100 MG capsule Take 200 mg by mouth every morning. Not using currently (Patient not taking: Reported on 08/24/2023)     ipratropium (ATROVENT) 0.03 % nasal spray Place 1-2 sprays into both nostrils 2 (two) times daily as needed (nasal drainage). (Patient not taking: Reported on 08/24/2023) 30 mL 5   No current facility-administered medications for this visit.   Allergies: Allergies  Allergen Reactions   Hydroxyzine Hcl Other (See Comments)    ?reaction type  ?reaction type, Other Reaction: Other reaction   Nortriptyline Hypertension    Palpitations, high blood pressure   Other Other (See Comments), Nausea And Vomiting and Rash    Uncoded Allergy. Allergen: tape, tegaderm   Dairy Aid [Tilactase]     DAIRY   Gluten Meal     Gluten causes asthma attack and severe allergy symptoms   Kiwi Extract    Singulair [Montelukast]     Mood changes   Aimovig [Erenumab-Aooe] Rash   Nurtec [Rimegepant Sulfate] Rash   Penicillins Rash   Sulfa Antibiotics Nausea And Vomiting and Rash   I reviewed her past medical history, social history, family history, and environmental history and no significant changes have been reported from her previous visit.  Review of Systems  Constitutional:  Negative for appetite change, chills, fever and unexpected weight change.  HENT:  Positive for congestion. Negative for rhinorrhea.   Eyes:  Negative for itching.  Respiratory:  Negative for cough, chest tightness, shortness of breath and wheezing.   Cardiovascular:  Negative for chest pain.  Gastrointestinal:  Negative for abdominal pain.  Genitourinary:  Negative for difficulty urinating.  Skin:  Negative for rash.  Allergic/Immunologic: Positive for environmental allergies.  Neurological:  Positive for headaches.    Objective: BP 116/70   Pulse 74   Temp 98.1 F (36.7  C)   Resp 18   SpO2 98%  There is no height or weight on file to calculate BMI. Physical Exam Vitals and nursing note reviewed.  Constitutional:      Appearance: Normal appearance. She is well-developed.  HENT:     Head: Normocephalic and atraumatic.     Right Ear: Tympanic membrane and external ear normal.     Left Ear: Tympanic membrane and external ear normal.     Nose: Congestion (on left side) present.     Mouth/Throat:     Mouth: Mucous membranes are moist.     Pharynx: Oropharynx is clear.  Eyes:     Conjunctiva/sclera: Conjunctivae normal.  Cardiovascular:     Rate and Rhythm: Normal rate and regular rhythm.     Heart sounds: Normal heart  sounds. No murmur heard.    No friction rub. No gallop.  Pulmonary:     Effort: Pulmonary effort is normal.     Breath sounds: Normal breath sounds. No wheezing, rhonchi or rales.  Musculoskeletal:     Cervical back: Neck supple.  Skin:    General: Skin is warm.     Findings: No rash.  Neurological:     Mental Status: She is alert and oriented to person, place, and time.  Psychiatric:        Behavior: Behavior normal.   Previous notes and tests were reviewed. The plan was reviewed with the patient/family, and all questions/concerned were addressed.  It was my pleasure to see Kjersten today and participate in her care. Please feel free to contact me with any questions or concerns.  Sincerely,  Wyline Mood, DO Allergy & Immunology  [facility] office: [phone] [facility] office: [phone]

## 2023-08-25 ENCOUNTER — Telehealth: Payer: Self-pay | Admitting: Pharmacy Technician

## 2023-08-25 ENCOUNTER — Other Ambulatory Visit (HOSPITAL_COMMUNITY): Payer: Self-pay

## 2023-08-25 NOTE — Telephone Encounter (Signed)
Pharmacy Patient Advocate Encounter   Received notification from Pt Calls Messages that prior authorization for [facility] 120MG  is required/requested.   Insurance verification completed.   The patient is insured through [facility] .   Per test claim: PA required; PA submitted to above mentioned insurance via CoverMyMeds Key/confirmation #/EOC [id number] Status is pending

## 2023-08-25 NOTE — Telephone Encounter (Signed)
PA has been submitted, and telephone encounter has been created. 

## 2023-08-26 ENCOUNTER — Other Ambulatory Visit (HOSPITAL_COMMUNITY): Payer: Self-pay

## 2023-08-26 NOTE — Telephone Encounter (Signed)
Pharmacy Patient Advocate Encounter  Received notification from [facility] that Prior Authorization for Emgality has been APPROVED from 08/25/2023 to 11/17/2023. Ran test claim, Copay is $40.00. This test claim was processed through [facility]- copay amounts may vary at other pharmacies due to pharmacy/plan contracts, or as the patient moves through the different stages of their insurance plan.      *$40.00 for loading dose and continuing dose

## 2023-08-27 ENCOUNTER — Other Ambulatory Visit: Payer: Self-pay

## 2023-08-27 MED ORDER — HYDROCORTISONE ACETATE 25 MG RE SUPP: 25.0000 mg | Freq: Two times a day (BID) | RECTAL | 1 refills | Status: DC

## 2023-08-27 NOTE — Telephone Encounter (Signed)
Called patient, she was constipated with changes in diet, was not straining but noticed recurrent symptoms from hemorrhoids with protrusion on left side.  Advised her to increase water intake, continue Benefiber 1 tablespoon 3 times daily with meals.  Use Anusol suppository per rectum twice daily for next 3 to 4 days.  Call with any worsening symptoms.  She will need follow-up office visit with me next available appointment, please try to bring her in sooner if there is any cancellation.

## 2023-08-27 NOTE — Telephone Encounter (Signed)
Inbound call from patient, following up on message below.

## 2023-08-30 NOTE — Telephone Encounter (Signed)
See note below from Dr. Lavon Paganini.  Patient is calling to schedule an appointment for her hemorrhoids

## 2023-09-02 ENCOUNTER — Other Ambulatory Visit: Payer: BC Managed Care – PPO

## 2023-09-03 ENCOUNTER — Ambulatory Visit
Admission: RE | Admit: 2023-09-03 | Discharge: 2023-09-03 | Disposition: A | Discharge status: Home / Self Care | Payer: BC Managed Care – PPO | Source: Ambulatory Visit | Attending: Nurse Practitioner | Admitting: Nurse Practitioner

## 2023-09-03 DIAGNOSIS — E162 Hypoglycemia, unspecified: Secondary | ICD-10-CM

## 2023-09-03 DIAGNOSIS — Z9889 Other specified postprocedural states: Secondary | ICD-10-CM | POA: Diagnosis not present

## 2023-09-03 MED ORDER — IOPAMIDOL (ISOVUE-300) INJECTION 61%
100.0000 mL | Freq: Once | INTRAVENOUS | Status: AC | PRN
  Administered 2023-09-03: 100 mL via INTRAVENOUS

## 2023-09-09 ENCOUNTER — Encounter: Payer: Self-pay | Admitting: Gastroenterology

## 2023-09-09 ENCOUNTER — Encounter: Payer: Self-pay | Admitting: Nurse Practitioner

## 2023-09-09 DIAGNOSIS — E88819 Insulin resistance, unspecified: Secondary | ICD-10-CM

## 2023-09-09 MED ORDER — SEMAGLUTIDE(0.25 OR 0.5MG/DOS) 2 MG/1.5ML ~~LOC~~ SOPN: 0.2500 mg | PEN_INJECTOR | SUBCUTANEOUS | 3 refills | Status: DC

## 2023-10-06 ENCOUNTER — Encounter: Payer: BC Managed Care – PPO | Admitting: Gastroenterology

## 2023-10-13 DIAGNOSIS — T148XXA Other injury of unspecified body region, initial encounter: Secondary | ICD-10-CM | POA: Diagnosis not present

## 2023-10-13 DIAGNOSIS — L929 Granulomatous disorder of the skin and subcutaneous tissue, unspecified: Secondary | ICD-10-CM | POA: Diagnosis not present

## 2023-10-13 DIAGNOSIS — B958 Unspecified staphylococcus as the cause of diseases classified elsewhere: Secondary | ICD-10-CM | POA: Diagnosis not present

## 2023-10-18 ENCOUNTER — Telehealth: Payer: Self-pay | Admitting: Pharmacy Technician

## 2023-10-18 ENCOUNTER — Other Ambulatory Visit (HOSPITAL_COMMUNITY): Payer: Self-pay

## 2023-10-18 NOTE — Telephone Encounter (Signed)
 The Prior authorization (PA) can not be completed until the requested information is provided. If the suggested medications have not been trialed and there are no contraindications to their use, the PA will not be submitted, as it will not be approved. Also, if we have not received answers to the clinical questions we (The PA team) sent you, we can not proceed. Please see the 3 questions below.     Does the patient have a diagnosis of, or is at high risk for, atherosclerotic cardiovascular disease, heart failure, and/or chronic kidney disease?

## 2023-10-18 NOTE — Telephone Encounter (Signed)
 Pharmacy Patient Advocate Encounter   Received notification from CoverMyMeds that prior authorization for Ozempic  0.25mg  is required/requested.   Insurance verification completed.   The patient is insured through [employer] .   Per test claim: PA required: PA started. Key: [id number] Based on PA questions, pt likely doesn't qualify, but I'm having the Pharmacist review prior to submission.

## 2023-10-19 ENCOUNTER — Encounter: Payer: Self-pay | Admitting: Neurology

## 2023-10-19 NOTE — Telephone Encounter (Signed)
Pt was notified. She wants to hold off on trying another med right now. She was just given a new migraine medication and wants to see how she does on that first.

## 2023-10-19 NOTE — Telephone Encounter (Signed)
Ok, thank you for trying.  The patient was aware that this may happen.  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  Can you call her and let her know it was denied?  Thanks

## 2023-10-19 NOTE — Telephone Encounter (Signed)
It looks like this pt will not be approved without a Dx of Diabetes.

## 2023-10-20 ENCOUNTER — Other Ambulatory Visit: Payer: Self-pay | Admitting: Neurology

## 2023-10-20 ENCOUNTER — Encounter: Payer: Self-pay | Admitting: Gastroenterology

## 2023-10-20 ENCOUNTER — Ambulatory Visit: Payer: BC Managed Care – PPO | Admitting: Gastroenterology

## 2023-10-20 VITALS — BP 98/62 | HR 70 | Ht 67.0 in | Wt 155.0 lb

## 2023-10-20 DIAGNOSIS — K222 Esophageal obstruction: Secondary | ICD-10-CM

## 2023-10-20 DIAGNOSIS — R1310 Dysphagia, unspecified: Secondary | ICD-10-CM | POA: Diagnosis not present

## 2023-10-20 DIAGNOSIS — K911 Postgastric surgery syndromes: Secondary | ICD-10-CM

## 2023-10-20 DIAGNOSIS — K645 Perianal venous thrombosis: Secondary | ICD-10-CM | POA: Diagnosis not present

## 2023-10-20 DIAGNOSIS — R1319 Other dysphagia: Secondary | ICD-10-CM

## 2023-10-20 DIAGNOSIS — K5904 Chronic idiopathic constipation: Secondary | ICD-10-CM

## 2023-10-20 DIAGNOSIS — Z8719 Personal history of other diseases of the digestive system: Secondary | ICD-10-CM

## 2023-10-20 MED ORDER — EMGALITY 120 MG/ML ~~LOC~~ SOAJ: 120.0000 mg | SUBCUTANEOUS | 11 refills | Status: DC

## 2023-10-20 NOTE — Patient Instructions (Addendum)
You have been scheduled for an endoscopy. Please follow written instructions given to you at your visit today.  If you use inhalers (even only as needed), please bring them with you on the day of your procedure.  If you take any of the following medications, they will need to be adjusted prior to your procedure:   DO NOT TAKE 7 DAYS PRIOR TO TEST- Trulicity (dulaglutide) Ozempic, Wegovy (semaglutide) Mounjaro (tirzepatide) Bydureon Bcise (exanatide extended release)  DO NOT TAKE 1 DAY PRIOR TO YOUR TEST Rybelsus (semaglutide) Adlyxin (lixisenatide) Victoza (liraglutide) Byetta (exanatide) ___________________________________________________________________________ VISIT SUMMARY:  Dr. Roxan Hockey Showman, you visited today to discuss your esophageal issues following your Nissen fundoplication surgery. You reported difficulty swallowing, a sensation of food getting stuck, and concerns about potential vagal nerve involvement affecting your migraines. We also reviewed your management of dumping syndrome and hypoglycemia, as well as your history of hemorrhoids. We discussed the next steps for your treatment and general health maintenance.  YOUR PLAN:  -ESOPHAGEAL STRICTURE POST-NISSEN FUNDOPLICATION: An esophageal stricture is a narrowing of the esophagus, which can cause difficulty swallowing. This may be due to your previous Nissen fundoplication surgery. We will perform an upper endoscopy with dilation to help widen the esophagus. If this is not effective, further tests like manometry or surgical revision may be needed. Please eat upright, chew your food well, and take small sips of water. Avoid lifting weights over 30 pounds.  -DUMPING SYNDROME: Dumping syndrome occurs when food moves too quickly from the stomach to the small intestine, often causing hypoglycemia. This is likely related to your previous surgery. Continue managing your diet to avoid high carbohydrate intake and monitor your  blood glucose levels with your Dexcom device.  -THROMBOSED HEMORRHOIDS: Thrombosed hemorrhoids are swollen veins in the rectum that can cause pain and bleeding. Currently, you are asymptomatic. Continue to monitor the size and symptoms of your hemorrhoids, avoid lifting weights over 30 pounds, maintain a high-fiber diet, stay hydrated, and avoid prolonged sitting on the commode.  -GENERAL HEALTH MAINTENANCE: For your pet's health, use the [employer] body condition score and [employer] calorie calculator to manage their diet and weight.  INSTRUCTIONS:  Please follow up after your upper endoscopy to discuss the findings and next steps. Additionally, contact your previous surgeon's office to obtain records of your Nissen fundoplication and any potential mesh placement.
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
## 2023-11-01 ENCOUNTER — Encounter: Payer: Self-pay | Admitting: Gastroenterology

## 2023-11-03 DIAGNOSIS — S61452A Open bite of left hand, initial encounter: Secondary | ICD-10-CM | POA: Diagnosis not present

## 2023-11-03 DIAGNOSIS — S61451A Open bite of right hand, initial encounter: Secondary | ICD-10-CM | POA: Diagnosis not present

## 2023-11-17 ENCOUNTER — Encounter: Payer: Self-pay | Admitting: Family Medicine

## 2023-11-17 ENCOUNTER — Other Ambulatory Visit (HOSPITAL_COMMUNITY): Payer: Self-pay

## 2023-11-21 ENCOUNTER — Encounter: Payer: Self-pay | Admitting: Neurology

## 2023-11-25 ENCOUNTER — Encounter: Payer: BC Managed Care – PPO | Admitting: Family Medicine

## 2023-11-28 ENCOUNTER — Encounter: Payer: Self-pay | Admitting: Certified Registered Nurse Anesthetist

## 2023-11-30 ENCOUNTER — Encounter: Payer: BC Managed Care – PPO | Admitting: Family Medicine

## 2023-12-02 ENCOUNTER — Encounter: Payer: Self-pay | Admitting: Gastroenterology

## 2023-12-02 ENCOUNTER — Ambulatory Visit: Payer: BC Managed Care – PPO | Admitting: Gastroenterology

## 2023-12-02 VITALS — BP 131/83 | HR 76 | Temp 98.2°F | Resp 7 | Ht 67.0 in | Wt 155.0 lb

## 2023-12-02 DIAGNOSIS — R1319 Other dysphagia: Secondary | ICD-10-CM

## 2023-12-02 DIAGNOSIS — K222 Esophageal obstruction: Secondary | ICD-10-CM

## 2023-12-02 DIAGNOSIS — R1310 Dysphagia, unspecified: Secondary | ICD-10-CM | POA: Diagnosis not present

## 2023-12-02 DIAGNOSIS — Z9889 Other specified postprocedural states: Secondary | ICD-10-CM | POA: Diagnosis not present

## 2023-12-02 MED ORDER — SODIUM CHLORIDE 0.9 % IV SOLN: 500.0000 mL | Freq: Once | INTRAVENOUS | Status: DC
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
## 2023-12-02 NOTE — Progress Notes (Signed)
 Report given to PACU, vss

## 2023-12-02 NOTE — Patient Instructions (Addendum)
-  Resume previous diet tomorrow -Follow an antireflux regimen   -Continue present medications -Dilation diet handout given    YOU HAD AN ENDOSCOPIC PROCEDURE TODAY AT THE [facility]:   Refer to the procedure report that was given to you for any specific questions about what was found during the examination.  If the procedure report does not answer your questions, please call your gastroenterologist to clarify.  If you requested that your care partner not be given the details of your procedure findings, then the procedure report has been included in a sealed envelope for you to review at your convenience later.  YOU SHOULD EXPECT: Some feelings of bloating in the abdomen. Passage of more gas than usual.  Walking can help get rid of the air that was put into your GI tract during the procedure and reduce the bloating. If you had a lower endoscopy (such as a colonoscopy or flexible sigmoidoscopy) you may notice spotting of blood in your stool or on the toilet paper. If you underwent a bowel prep for your procedure, you may not have a normal bowel movement for a few days.  Please Note:  You might notice some irritation and congestion in your nose or some drainage.  This is from the oxygen used during your procedure.  There is no need for concern and it should clear up in a day or so.  SYMPTOMS TO REPORT IMMEDIATELY:  Following upper endoscopy (EGD)  Vomiting of blood or coffee ground material  New chest pain or pain under the shoulder blades  Painful or persistently difficult swallowing  New shortness of breath  Fever of 100F or higher  Black, tarry-looking stools  For urgent or emergent issues, a gastroenterologist can be reached at any hour by calling [phone]. Do not use MyChart messaging for urgent concerns.    DIET:  Follow up dilation diet (see handout). Clear liquid for 2 hours and soft diet for the rest of the day.  Drink plenty of fluids but you should avoid alcoholic  beverages for 24 hours.  ACTIVITY:  You should plan to take it easy for the rest of today and you should NOT DRIVE or use heavy machinery until tomorrow (because of the sedation medicines used during the test).    FOLLOW UP: Our staff will call the number listed on your records the next business day following your procedure.  We will call around 7:15- 8:00 am to check on you and address any questions or concerns that you may have regarding the information given to you following your procedure. If we do not reach you, we will leave a message.     If any biopsies were taken you will be contacted by phone or by letter within the next 1-3 weeks.  Please call us at [phone] if you have not heard about the biopsies in 3 weeks.    SIGNATURES/CONFIDENTIALITY: You and/or your care partner have signed paperwork which will be entered into your electronic medical record.  These signatures attest to the fact that that the information above on your After Visit Summary has been reviewed and is understood.  Full responsibility of the confidentiality of this discharge information lies with you and/or your care-partner.

## 2023-12-02 NOTE — Op Note (Signed)
 [facility] Patient Name: Tami Mcdonald Procedure Date: 12/02/2023 8:54 AM MRN: [id number] Endoscopist: Napoleon Form , MD, [id number] Age: 48 Referring MD:  Date of Birth: 24-Nov-1975 Gender: Female Account #: [id number] Procedure:                Upper GI endoscopy Indications:              Dysphagia Medicines:                Monitored Anesthesia Care Procedure:                Pre-Anesthesia Assessment:                           - Prior to the procedure, a History and Physical                            was performed, and patient medications and                            allergies were reviewed. The patient's tolerance of                            previous anesthesia was also reviewed. The risks                            and benefits of the procedure and the sedation                            options and risks were discussed with the patient.                            All questions were answered, and informed consent                            was obtained. Prior Anticoagulants: The patient has                            taken no anticoagulant or antiplatelet agents. ASA                            Grade Assessment: II - A patient with mild systemic                            disease. After reviewing the risks and benefits,                            the patient was deemed in satisfactory condition to                            undergo the procedure.                           After obtaining informed consent, the endoscope was  passed under direct vision. Throughout the                            procedure, the patient's blood pressure, pulse, and                            oxygen saturations were monitored continuously. The                            Olympus Scope [id number] was introduced through the                            mouth, and advanced to the second part of duodenum.                            The upper GI endoscopy was  accomplished without                            difficulty. The patient tolerated the procedure                            well. Scope In: Scope Out: Findings:                 One benign-appearing, intrinsic mild stenosis was                            found 39 to 40 cm from the incisors. This stenosis                            measured 1.8 cm (inner diameter) x less than one cm                            (in length). The stenosis was traversed. A TTS                            dilator was passed through the scope. Dilation with                            an 18-19-20 mm x 8 cm CRE balloon dilator was                            performed to 20 mm. The dilation site was examined                            following endoscope reinsertion and showed mild                            improvement in luminal narrowing.                           The exam of the esophagus was otherwise normal.  Evidence of a Nissen fundoplication was found at                            the gastroesophageal junction. The wrap appeared                            intact. This was traversed.                           The stomach was normal.                           The cardia and gastric fundus were normal on                            retroflexion.                           The examined duodenum was normal. Complications:            No immediate complications. Estimated Blood Loss:     Estimated blood loss was minimal. Impression:               - Benign-appearing esophageal stenosis. Dilated.                           - A Nissen fundoplication was found. The wrap                            appears intact.                           - Normal stomach.                           - Normal examined duodenum.                           - No specimens collected. Recommendation:           - Patient has a contact number available for                            emergencies. The signs and symptoms of  potential                            delayed complications were discussed with the                            patient. Return to normal activities tomorrow.                            Written discharge instructions were provided to the                            patient.                           -  Resume previous diet.                           - Continue present medications.                           - Follow an antireflux regimen. Napoleon Form, MD 12/02/2023 9:23:59 AM This report has been signed electronically.

## 2023-12-02 NOTE — Progress Notes (Signed)
0825 Robinul 0.1 mg IV given due large amount of secretions upon assessment.  MD made aware, vss 

## 2023-12-02 NOTE — Progress Notes (Signed)
 Pt's states no medical or surgical changes since previsit or office visit.

## 2023-12-02 NOTE — Progress Notes (Signed)
 Called to room to assist during endoscopic procedure.  Patient ID and intended procedure confirmed with present staff. Received instructions for my participation in the procedure from the performing physician.

## 2023-12-03 ENCOUNTER — Telehealth: Payer: Self-pay

## 2023-12-03 NOTE — Telephone Encounter (Signed)
  Follow up Call-     12/02/2023    7:44 AM 01/08/2023    7:54 AM  Call back number  Post procedure Call Back phone  # [phone]  Permission to leave phone message Yes Yes     Patient questions:  Do you have a fever, pain , or abdominal swelling? No. Pain Score  0 *  Have you tolerated food without any problems? Yes.    Have you been able to return to your normal activities? Yes.    Do you have any questions about your discharge instructions: Diet   No. Medications  No. Follow up visit  No.  Do you have questions or concerns about your Care? No.  Actions: * If pain score is 4 or above: No action needed, pain <4.

## 2023-12-08 ENCOUNTER — Other Ambulatory Visit: Payer: Self-pay | Admitting: Neurology

## 2023-12-08 ENCOUNTER — Telehealth: Payer: Self-pay | Admitting: Pharmacist

## 2023-12-08 ENCOUNTER — Other Ambulatory Visit (HOSPITAL_COMMUNITY): Payer: Self-pay

## 2023-12-08 NOTE — Telephone Encounter (Signed)
 Pharmacy Patient Advocate Encounter   Received notification from Patient Pharmacy that prior authorization for Emgality 120MG /ML auto-injectors (migraine) is required/requested.   Insurance verification completed.   The patient is insured through [facility] .   Per test claim: PA required; PA submitted to above mentioned insurance via CoverMyMeds Key/confirmation #/EOC [id number] Status is pending

## 2023-12-08 NOTE — Telephone Encounter (Signed)
 Pharmacy Patient Advocate Encounter  Received notification from [facility] that Prior Authorization for Emgality 120MG /ML auto-injectors (migraine) has been APPROVED from 12/08/2023 to 12/07/2024   PA #/Case ID/Reference #: [id number]

## 2023-12-15 ENCOUNTER — Ambulatory Visit: Payer: BC Managed Care – PPO | Admitting: Nurse Practitioner

## 2023-12-15 ENCOUNTER — Encounter: Payer: Self-pay | Admitting: Nurse Practitioner

## 2023-12-15 VITALS — BP 108/78 | HR 70 | Ht 67.0 in | Wt 152.0 lb

## 2023-12-15 DIAGNOSIS — E88819 Insulin resistance, unspecified: Secondary | ICD-10-CM

## 2023-12-15 DIAGNOSIS — E162 Hypoglycemia, unspecified: Secondary | ICD-10-CM | POA: Diagnosis not present
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
## 2024-01-13 ENCOUNTER — Ambulatory Visit: Payer: BC Managed Care – PPO | Admitting: Neurology

## 2024-01-13 ENCOUNTER — Encounter: Payer: Self-pay | Admitting: Neurology

## 2024-01-13 VITALS — BP 115/79 | HR 77 | Ht 64.0 in | Wt 152.0 lb

## 2024-01-13 DIAGNOSIS — G44229 Chronic tension-type headache, not intractable: Secondary | ICD-10-CM

## 2024-01-13 DIAGNOSIS — G43109 Migraine with aura, not intractable, without status migrainosus: Secondary | ICD-10-CM

## 2024-01-13 DIAGNOSIS — G43709 Chronic migraine without aura, not intractable, without status migrainosus: Secondary | ICD-10-CM

## 2024-01-13 MED ORDER — AJOVY 225 MG/1.5ML ~~LOC~~ SOAJ: 225.0000 mg | SUBCUTANEOUS | 5 refills | Status: DC

## 2024-01-13 MED ORDER — ONDANSETRON 8 MG PO TBDP: 8.0000 mg | ORAL_TABLET | Freq: Three times a day (TID) | ORAL | 3 refills | Status: AC | PRN

## 2024-01-13 NOTE — Progress Notes (Signed)
 Medication Samples have been provided to the patient.  Drug name: Reyvow       Strength: 50 mg        Qty: 2  LOT: [id number]  Exp.Date: 3/26   Dosing instructions: as needed  The patient has been instructed regarding the correct time, dose, and frequency of taking this medication, including desired effects and most common side effects.   Tami Mcdonald 10:53 AM 01/13/2024

## 2024-01-13 NOTE — Patient Instructions (Signed)
 Stop Emgality .  Start Ajovy  every 28 days.  Continue Cefaly Take Reyvow 50mg  pill once daily as needed.  No driving for 8 hours after use.  Let me know if it is effective.  Fioricet sparingly Limit use of pain relievers to no more than 9 days out of the month to prevent risk of rebound or medication-overuse headache. Keep headache diary Follow up 6 months.

## 2024-01-21 ENCOUNTER — Other Ambulatory Visit: Payer: Self-pay | Admitting: Allergy

## 2024-01-26 ENCOUNTER — Telehealth: Payer: Self-pay | Admitting: Neurology

## 2024-01-26 NOTE — Telephone Encounter (Signed)
 Pt. Cld bk Afterhours

## 2024-01-26 NOTE — Telephone Encounter (Signed)
 Verify sent to correct pharmacy.  PA team can we check to see if a PA is needed.

## 2024-01-26 NOTE — Telephone Encounter (Signed)
 Pt. Has not seen Rx change to Rx Anjovy, if possible provide samples until dispensed, contact Pt. For update

## 2024-01-28 ENCOUNTER — Ambulatory Visit: Payer: Self-pay

## 2024-01-28 ENCOUNTER — Other Ambulatory Visit: Payer: Self-pay

## 2024-01-28 ENCOUNTER — Other Ambulatory Visit (HOSPITAL_COMMUNITY): Payer: Self-pay

## 2024-01-28 ENCOUNTER — Emergency Department (HOSPITAL_BASED_OUTPATIENT_CLINIC_OR_DEPARTMENT_OTHER)

## 2024-01-28 ENCOUNTER — Emergency Department (HOSPITAL_BASED_OUTPATIENT_CLINIC_OR_DEPARTMENT_OTHER)
Admission: EM | Admit: 2024-01-28 | Discharge: 2024-01-28 | Disposition: A | Discharge status: Home / Self Care | Attending: Emergency Medicine | Admitting: Emergency Medicine

## 2024-01-28 ENCOUNTER — Telehealth: Payer: Self-pay

## 2024-01-28 ENCOUNTER — Encounter (HOSPITAL_BASED_OUTPATIENT_CLINIC_OR_DEPARTMENT_OTHER): Payer: Self-pay | Admitting: *Deleted

## 2024-01-28 DIAGNOSIS — T148XXA Other injury of unspecified body region, initial encounter: Secondary | ICD-10-CM

## 2024-01-28 DIAGNOSIS — Z0389 Encounter for observation for other suspected diseases and conditions ruled out: Secondary | ICD-10-CM | POA: Diagnosis not present

## 2024-01-28 DIAGNOSIS — Y829 Unspecified medical devices associated with adverse incidents: Secondary | ICD-10-CM

## 2024-01-28 DIAGNOSIS — S41132A Puncture wound without foreign body of left upper arm, initial encounter: Secondary | ICD-10-CM | POA: Diagnosis not present

## 2024-01-28 DIAGNOSIS — M7989 Other specified soft tissue disorders: Secondary | ICD-10-CM | POA: Insufficient documentation

## 2024-01-28 DIAGNOSIS — Y828 Other medical devices associated with adverse incidents: Secondary | ICD-10-CM | POA: Insufficient documentation

## 2024-01-28 LAB — CBG MONITORING, ED: Glucose-Capillary: 120 mg/dL — ABNORMAL HIGH (ref 70–99)

## 2024-01-28 NOTE — Discharge Instructions (Addendum)
 Watch for redness or swelling.  Return if any problems.

## 2024-01-28 NOTE — Telephone Encounter (Signed)
 Pharmacy Patient Advocate Encounter   Received notification from CoverMyMeds that prior authorization for AJOVY  (fremanezumab -vfrm) injection 225MG /1.5ML auto-injectors is required/requested.   Insurance verification completed.   The patient is insured through [facility] .   Per test claim: PA required; PA submitted to above mentioned insurance via CoverMyMeds Key/confirmation #/EOC [id number] Status is pending

## 2024-01-28 NOTE — ED Triage Notes (Addendum)
 Here by POV from home for "Dexcom wires left in arm when sensor removed". F/b in L tricep area arm since ~24 hrs ago. Scant swelling, scant marking of possible f/b. Denies unusual pain. No s/sx of infection. BS 120 in triage. Denies other sx.

## 2024-01-28 NOTE — Telephone Encounter (Signed)
 Pt called in. She is using [facility] in [facility] and the pharmacy has the prescription, they are just still waiting on the PA

## 2024-01-29 NOTE — ED Provider Notes (Signed)
 [facility] Provider Note   CSN: [id number] Arrival date & time: 01/28/24  1821     History  Chief Complaint  Patient presents with   Foreign Body in Skin    Tami Mcdonald is a 48 y.o. female.  Patient reports that she removed her Dexcom scanner from her left arm today.  Patient reports when she put it on yesterday the sensor did not transmit.  She noticed when she removed the sensor today that the middle end was missing.  Patient unsure if the area could have broke off in her arm or if this was a defective device.  Patient requesting an x-ray to evaluate to see if she has a foreign body.  She has not been able to feel a foreign body.  Patient denies any other complaints  The history is provided by the patient. No language interpreter was used.       Home Medications Prior to Admission medications   Medication Sig Start Date End Date Taking? Authorizing Provider  ACETAMINOPHEN -BUTALBITAL  50-325 MG TABS TAKE 1 TO 2 TABLETS BY MOUTH EVERY 6 HOURS AS NEEDED 05/12/22   Festus Hubert, Adam R, DO  albuterol  (VENTOLIN  HFA) 108 (90 Base) MCG/ACT inhaler INHALE 2 PUFFS INTO THE LUNGS EVERY 6 HOURS AS NEEDED FOR WHEEZE 04/22/23   Trudy Fusi, DO  b complex vitamins tablet Take by mouth.    [provider]  budesonide -formoterol  (SYMBICORT ) 80-4.5 MCG/ACT inhaler INHALE 2 PUFFS INTO THE LUNGS IN THE MORNING AND AT BEDTIME. WITH SPACER AND RINSE MOUTH AFTERWARDS. 01/21/24   Trudy Fusi, DO  cholecalciferol (VITAMIN D3) 25 MCG (1000 UNIT) tablet Take 1,000 Units by mouth daily.    [provider]  Continuous Blood Gluc Sensor (DEXCOM G7 SENSOR) MISC Inject 1 Application into the skin as directed. Change sensor every 10 days as directed. 12/14/22   Wendel Hals, NP  Fremanezumab -vfrm (AJOVY ) 225 MG/1.5ML SOAJ Inject 225 mg into the skin every 28 (twenty-eight) days. 01/13/24   Merriam Abbey, DO  hydrocortisone  (ANUSOL -HC) 2.5 % rectal  cream Place 1 Application rectally 2 (two) times daily. 02/22/23   Pyrtle, Amber Bail, MD  hydrocortisone  (ANUSOL -HC) 25 MG suppository Place 1 suppository (25 mg total) rectally every 12 (twelve) hours. 08/27/23   Nandigam, Kavitha V, MD  ipratropium (ATROVENT ) 0.03 % nasal spray Place 1-2 sprays into both nostrils 2 (two) times daily as needed (nasal drainage). 10/08/22   Trudy Fusi, DO  Krill Oil CAPS Take by mouth. Mega red- 1-2 week    [provider]  Lactobacillus Rhamnosus, GG, (CULTURELLE PO) Take by mouth.    [provider]  Loratadine 10 MG CAPS Claritin    [provider]  Multiple Vitamin (MULTIVITAMIN) tablet Take 1 tablet by mouth daily. 2-3 week    [provider]  mupirocin  ointment (BACTROBAN ) 2 % Place 1 Application into the nose at bedtime as needed. 06/25/23   Neda Balk, MD  NONFORMULARY OR COMPOUNDED ITEM Vaginal vitamin E suppository, 200 international units, place one in the vagina at night time twice a week.  Disp:  24 suppositories,  RF:  3. Patient not taking: Reported on 01/13/2024 04/08/23   Amundson C Silva, Brook E, MD  ondansetron  (ZOFRAN -ODT) 8 MG disintegrating tablet Take 1 tablet (8 mg total) by mouth every 8 (eight) hours as needed for nausea or vomiting. 01/13/24   Festus Hubert, Adam R, DO  tiZANidine  (ZANAFLEX ) 4 MG tablet  [provider]  traMADol  (ULTRAM ) 50 MG tablet TAKE 1 TABLET BY MOUTH EVERY 6 HOURS AS NEEDED. 12/09/23   Ellene Gustin, MD  vitamin C (ASCORBIC ACID) 500 MG tablet Take 500 mg by mouth daily. Patient only takes occasionally    [provider]      Allergies    Gluten meal, Hydroxyzine hcl, Kiwi extract, Nortriptyline , Other, Aimovig  [erenumab -aooe], Dairy aid [tilactase], Nurtec [rimegepant sulfate], Penicillins, Singulair  [montelukast ], and Sulfa antibiotics    Review of Systems   Review of Systems  All other systems reviewed and are negative.   Physical Exam Updated Vital Signs BP  138/84   Pulse 78   Temp 98 F (36.7 C)   Resp 18   Wt 68.9 kg   LMP 11/20/2023 (Approximate)   SpO2 100%   BMI 26.09 kg/m  Physical Exam Vitals reviewed.  Constitutional:      Appearance: Normal appearance.  Cardiovascular:     Rate and Rhythm: Normal rate.  Pulmonary:     Effort: Pulmonary effort is normal.  Skin:    General: Skin is warm.     Comments: Left upper arm no obvious foreign body no obvious puncture site.  Neurological:     General: No focal deficit present.     Mental Status: She is alert.  Psychiatric:        Mood and Affect: Mood normal.     ED Results / Procedures / Treatments   Labs (all labs ordered are listed, but only abnormal results are displayed) Labs Reviewed  CBG MONITORING, ED - Abnormal; Notable for the following components:      Result Value   Glucose-Capillary 120 (*)    All other components within normal limits    EKG None  Radiology DG Humerus Left Result Date: 01/28/2024 CLINICAL DATA:  Concerned that wires from Dexcom broke off and are in left upper arm. EXAM: LEFT HUMERUS - 2+ VIEW COMPARISON:  None Available. FINDINGS: There is no evidence of fracture or other focal bone lesions. Soft tissues are unremarkable. No radiopaque soft tissue foreign bodies are identified IMPRESSION: Negative. Electronically Signed   By: Virgle Grime M.D.   On: 01/28/2024 21:25    Procedures Procedures    Medications Ordered in ED Medications - No data to display  ED Course/ Medical Decision Making/ A&P                                 Medical Decision Making Patient reports tip of Dexcom may have broken off in her arm.  Amount and/or Complexity of Data Reviewed External Data Reviewed: notes.    Details: Primary care notes reviewed, labs reviewed.  No diabetes.  Patient being monitored for glucose fluctuations Radiology: ordered and independent interpretation performed. Decision-making details documented in ED Course.    Details: X-ray  left humerus does not show any evidence of foreign body  Risk Risk Details: I reviewed information on Dexcom.  Dexcom has a needle that is approximately 5 mm.  This should show up on an x-ray. Patient counseled on possible retained foreign bodies.  She is advised to watch the area for redness or swelling or any signs of infection.  She should return if any problems.  Patient patient agrees with plan            Final Clinical Impression(s) / ED Diagnoses Final diagnoses:  Medical device associated with adverse incidents  Puncture  wound    Rx / DC Orders ED Discharge Orders     None      An After Visit Summary was printed and given to the patient.    Sandi Crosby, PA-C 01/29/24 0016    Afton Horse T, DO 01/31/24 [phone]

## 2024-02-03 NOTE — Telephone Encounter (Signed)
 Pharmacy Patient Advocate Encounter  Received notification from [facility] that Prior Authorization for AJOVY  (fremanezumab -vfrm) injection 225MG /1.5ML auto-injectors has been APPROVED from 01-28-2024 to 04-21-2024   PA #/Case ID/Reference #: [id number]

## 2024-02-16 ENCOUNTER — Other Ambulatory Visit: Payer: Self-pay | Admitting: Allergy

## 2024-02-21 NOTE — Progress Notes (Signed)
 Follow Up Note  RE: Tami Mcdonald MRN: [id number] DOB: Mar 02, 1976 Date of Office Visit: 02/22/2024  Referring provider: Neda Balk, MD Primary care provider: Neda Balk, MD  Chief Complaint: No chief complaint on file.  History of Present Illness: I had the pleasure of seeing Tami Mcdonald for a follow up visit at the [facility] on 02/22/2024. She is a 48 y.o. female, who is being followed for asthma, allergic rhinoconjunctivitis, dyshidrotic eczema and headaches. Her previous allergy office visit was on 08/24/2023 with Dr. Burdette Carolin. Today is a regular follow up visit.  Discussed the use of AI scribe software for clinical note transcription with the patient, who gave verbal consent to proceed.  History of Present Illness            ***  Assessment and Plan: Alithia is a 48 y.o. female with: Asthma Past history - Diagnosed with asthma 4 years ago.  Takes Flovent  110 1 puff twice a day and albuterol  rare occasions with good benefit.  2022 spirometry was normal with no improvement in FEV1 post bronchodilator treatment.  Clinically feeling improved. Interim history - Well controlled with Symbicort  80 mcg twice daily. No recent use of rescue inhaler or need for systemic steroids. No recent ER or urgent care visits. Today's spirometry was normal. Daily controller medication(s): Symbicort  80mcg 2 puffs twice a day with spacer and rinse mouth afterwards. May use albuterol  rescue inhaler 2 puffs every 4 to 6 hours as needed for shortness of breath, chest tightness, coughing, and wheezing.  Monitor frequency of use - if you need to use it more than twice per week on a consistent basis let us  know.    Seasonal and perennial allergic rhinoconjunctivitis Past history - Perennial rhinoconjunctivitis symptoms for 40+ years.  2003 skin testing showed multiple positives and was on AIT for 4 to 5 years with good benefit.  Patient is a Administrator, [employer].  1 dog, 4 cats, 3  birds at home. 2022 skin testing showed: Positive to grass, trees, mold, cat, dog, horse and dust mites. Borderline to mixed feathers, mouse, hamster, [place] pig, rabbit, gerbil and rat. Singulair  caused mood changes. Interim history - Symptoms controlled with daily Claritin. No recent use of nasal spray. Noted nasal stuffiness during visit. Continue environmental control measures. Use over the counter antihistamines such as Zyrtec (cetirizine), Claritin (loratadine), Allegra  (fexofenadine ), or Xyzal (levocetirizine) daily as needed. May take twice a day during allergy flares. May switch antihistamines every few months. Use Atrovent  (ipratropium) 0.03% 1-2 sprays per nostril twice a day as needed for runny nose/drainage. Use Flonase  (fluticasone ) nasal spray 1-2 sprays per nostril once a day as needed for nasal congestion.  Nasal saline spray (i.e., Simply Saline) or nasal saline lavage (i.e., NeilMed) is recommended as needed and prior to medicated nasal sprays. Start allergy injections - 2 shots, will add horse as patient is a Administrator, [employer]. Let us  know when ready to start.  Had a detailed discussion with patient/family that clinical history is suggestive of allergic rhinitis, and may benefit from allergy immunotherapy (AIT). Discussed in detail regarding the dosing, schedule, side effects (mild to moderate local allergic reaction and rarely systemic allergic reactions including anaphylaxis), and benefits (significant improvement in nasal symptoms, seasonal flares of asthma) of immunotherapy with the patient. There is significant time commitment involved with allergy shots, which includes weekly immunotherapy injections for first 9-12 months and then biweekly to monthly injections for 3-5 years. Consent was signed. I will need  to send in epinephrine  device once starting allergy injections.    Dyshidrotic eczema Stable.  Continue proper skin care.  May use Epicerum twice a day as needed - this is a  moisturizer. Use Eucrisa (crisaborole) 2% ointment twice a day on mild rash flares on the face and body. This is a non-steroid ointment.    Migraines/headaches Continue to follow up with your neurologist Assessment and Plan              No follow-ups on file.  No orders of the defined types were placed in this encounter.  Lab Orders  No laboratory test(s) ordered today    Diagnostics: Spirometry:  Tracings reviewed. Her effort: {Blank single:19197::Good reproducible efforts.,It was hard to get consistent efforts and there is a question as to whether this reflects a maximal maneuver.,Poor effort, data can not be interpreted.} FVC: ***L FEV1: ***L, ***% predicted FEV1/FVC ratio: ***% Interpretation: {Blank single:19197::Spirometry consistent with mild obstructive disease,Spirometry consistent with moderate obstructive disease,Spirometry consistent with severe obstructive disease,Spirometry consistent with possible restrictive disease,Spirometry consistent with mixed obstructive and restrictive disease,Spirometry uninterpretable due to technique,Spirometry consistent with normal pattern,No overt abnormalities noted given today's efforts}.  Please see scanned spirometry results for details.  Skin Testing: {Blank single:19197::Select foods,Environmental allergy panel,Environmental allergy panel and select foods,Food allergy panel,None,Deferred due to recent antihistamines use}. *** Results discussed with patient/family.   Medication List:  Current Outpatient Medications  Medication Sig Dispense Refill  . ACETAMINOPHEN -BUTALBITAL  50-325 MG TABS TAKE 1 TO 2 TABLETS BY MOUTH EVERY 6 HOURS AS NEEDED 10 tablet 5  . albuterol  (VENTOLIN  HFA) 108 (90 Base) MCG/ACT inhaler INHALE 2 PUFFS INTO THE LUNGS EVERY 6 HOURS AS NEEDED FOR WHEEZE 8.5 each 1  . b complex vitamins tablet Take by mouth.    . cholecalciferol (VITAMIN D3) 25 MCG (1000 UNIT) tablet Take  1,000 Units by mouth daily.    . Continuous Blood Gluc Sensor (DEXCOM G7 SENSOR) MISC Inject 1 Application into the skin as directed. Change sensor every 10 days as directed. 9 each 3  . Fremanezumab -vfrm (AJOVY ) 225 MG/1.5ML SOAJ Inject 225 mg into the skin every 28 (twenty-eight) days. 1.68 mL 5  . hydrocortisone  (ANUSOL -HC) 2.5 % rectal cream Place 1 Application rectally 2 (two) times daily. 30 g 1  . hydrocortisone  (ANUSOL -HC) 25 MG suppository Place 1 suppository (25 mg total) rectally every 12 (twelve) hours. 12 suppository 1  . ipratropium (ATROVENT ) 0.03 % nasal spray Place 1-2 sprays into both nostrils 2 (two) times daily as needed (nasal drainage). 30 mL 5  . Krill Oil CAPS Take by mouth. Mega red- 1-2 week    . Lactobacillus Rhamnosus, GG, (CULTURELLE PO) Take by mouth.    . Loratadine 10 MG CAPS Claritin    . Multiple Vitamin (MULTIVITAMIN) tablet Take 1 tablet by mouth daily. 2-3 week    . mupirocin  ointment (BACTROBAN ) 2 % Place 1 Application into the nose at bedtime as needed. 22 g 1  . NONFORMULARY OR COMPOUNDED ITEM Vaginal vitamin E suppository, 200 international units, place one in the vagina at night time twice a week.  Disp:  24 suppositories,  RF:  3. (Patient not taking: Reported on 01/13/2024) 30 each 3  . ondansetron  (ZOFRAN -ODT) 8 MG disintegrating tablet Take 1 tablet (8 mg total) by mouth every 8 (eight) hours as needed for nausea or vomiting. 20 tablet 3  . SYMBICORT  80-4.5 MCG/ACT inhaler INHALE 2 PUFFS INTO THE LUNGS IN THE MORNING AND AT BEDTIME. WITH SPACER AND  RINSE MOUTH AFTERWARDS. 10.2 each 0  . tiZANidine  (ZANAFLEX ) 4 MG tablet  (Patient not taking: Reported on 01/13/2024)    . traMADol  (ULTRAM ) 50 MG tablet TAKE 1 TABLET BY MOUTH EVERY 6 HOURS AS NEEDED. 20 tablet 0  . vitamin C (ASCORBIC ACID) 500 MG tablet Take 500 mg by mouth daily. Patient only takes occasionally     No current facility-administered medications for this visit.   Allergies: Allergies   Allergen Reactions  . Gluten Meal Anaphylaxis    Gluten causes asthma attack and severe allergy symptoms  . Hydroxyzine Hcl Other (See Comments)    ?reaction type  ?reaction type, Other Reaction: Other reaction  . Kiwi Extract Itching  . Nortriptyline  Hypertension    Palpitations, high blood pressure  . Other Other (See Comments), Nausea And Vomiting and Rash    Uncoded Allergy. Allergen: tape, tegaderm  . Aimovig  [Erenumab -Aooe] Rash  . Dairy Aid [Tilactase] Cough    DAIRY-Allergy symptoms  . Nurtec [Rimegepant Sulfate] Rash  . Penicillins Rash  . Singulair  [Montelukast ] Other (See Comments)    Mood changes  . Sulfa Antibiotics Nausea And Vomiting and Rash   I reviewed her past medical history, social history, family history, and environmental history and no significant changes have been reported from her previous visit.  Review of Systems  Constitutional:  Negative for appetite change, chills, fever and unexpected weight change.  HENT:  Positive for congestion. Negative for rhinorrhea.   Eyes:  Negative for itching.  Respiratory:  Negative for cough, chest tightness, shortness of breath and wheezing.   Cardiovascular:  Negative for chest pain.  Gastrointestinal:  Negative for abdominal pain.  Genitourinary:  Negative for difficulty urinating.  Skin:  Negative for rash.  Allergic/Immunologic: Positive for environmental allergies.  Neurological:  Positive for headaches.   Objective: LMP 11/20/2023 (Approximate)  There is no height or weight on file to calculate BMI. Physical Exam Vitals and nursing note reviewed.  Constitutional:      Appearance: Normal appearance. She is well-developed.  HENT:     Head: Normocephalic and atraumatic.     Right Ear: Tympanic membrane and external ear normal.     Left Ear: Tympanic membrane and external ear normal.     Nose: Congestion (on left side) present.     Mouth/Throat:     Mouth: Mucous membranes are moist.     Pharynx:  Oropharynx is clear.   Eyes:     Conjunctiva/sclera: Conjunctivae normal.    Cardiovascular:     Rate and Rhythm: Normal rate and regular rhythm.     Heart sounds: Normal heart sounds. No murmur heard.    No friction rub. No gallop.  Pulmonary:     Effort: Pulmonary effort is normal.     Breath sounds: Normal breath sounds. No wheezing, rhonchi or rales.   Musculoskeletal:     Cervical back: Neck supple.   Skin:    General: Skin is warm.     Findings: No rash.   Neurological:     Mental Status: She is alert and oriented to person, place, and time.   Psychiatric:        Behavior: Behavior normal.  Previous notes and tests were reviewed. The plan was reviewed with the patient/family, and all questions/concerned were addressed.  It was my pleasure to see Vinita today and participate in her care. Please feel free to contact me with any questions or concerns.  Sincerely,  Eudelia Hero, DO Allergy & Immunology  [facility]  [place] office: [phone] [facility] office: [phone]

## 2024-02-22 ENCOUNTER — Encounter: Payer: Self-pay | Admitting: Allergy

## 2024-02-22 ENCOUNTER — Ambulatory Visit: Payer: BC Managed Care – PPO | Admitting: Allergy

## 2024-02-22 VITALS — BP 130/72 | HR 69 | Temp 98.5°F | Ht 63.78 in | Wt 156.0 lb

## 2024-02-22 DIAGNOSIS — L301 Dyshidrosis [pompholyx]: Secondary | ICD-10-CM

## 2024-02-22 DIAGNOSIS — J302 Other seasonal allergic rhinitis: Secondary | ICD-10-CM | POA: Diagnosis not present

## 2024-02-22 DIAGNOSIS — J3089 Other allergic rhinitis: Secondary | ICD-10-CM

## 2024-02-22 DIAGNOSIS — G43009 Migraine without aura, not intractable, without status migrainosus: Secondary | ICD-10-CM | POA: Diagnosis not present

## 2024-02-22 DIAGNOSIS — J4540 Moderate persistent asthma, uncomplicated: Secondary | ICD-10-CM

## 2024-02-22 DIAGNOSIS — H1010 Acute atopic conjunctivitis, unspecified eye: Secondary | ICD-10-CM

## 2024-02-22 DIAGNOSIS — H1013 Acute atopic conjunctivitis, bilateral: Secondary | ICD-10-CM

## 2024-02-22 MED ORDER — BUDESONIDE-FORMOTEROL FUMARATE 80-4.5 MCG/ACT IN AERO: 2.0000 | INHALATION_SPRAY | Freq: Two times a day (BID) | RESPIRATORY_TRACT | 3 refills | Status: DC

## 2024-02-22 MED ORDER — ALBUTEROL SULFATE HFA 108 (90 BASE) MCG/ACT IN AERS: 2.0000 | INHALATION_SPRAY | RESPIRATORY_TRACT | 1 refills | Status: AC | PRN

## 2024-02-22 NOTE — Patient Instructions (Addendum)
 Asthma: Normal breathing test today. Daily controller medication(s): Symbicort  80mcg 2 puffs twice a day with spacer and rinse mouth afterwards. May use albuterol  rescue inhaler 2 puffs every 4 to 6 hours as needed for shortness of breath, chest tightness, coughing, and wheezing.  Monitor frequency of use - if you need to use it more than twice per week on a consistent basis let us  know.  Asthma control goals:  Full participation in all desired activities (may need albuterol  before activity) Albuterol  use two times or less a week on average (not counting use with activity) Cough interfering with sleep two times or less a month Oral steroids no more than once a year No hospitalizations   Environmental allergies 2022 skin testing positive to grass, trees, mold, cat, dog, horse and dust mites. Borderline to mixed feathers, mouse, hamster, [place] pig, rabbit, gerbil and rat. Continue environmental control measures. Use over the counter antihistamines such as Zyrtec (cetirizine), Claritin (loratadine), Allegra  (fexofenadine ), or Xyzal (levocetirizine) daily as needed. May take twice a day during allergy flares. May switch antihistamines every few months. Use Atrovent  (ipratropium) 0.03% 1-2 sprays per nostril twice a day as needed for runny nose/drainage. Use Flonase  (fluticasone ) nasal spray 1-2 sprays per nostril once a day as needed for nasal congestion.  Nasal saline spray (i.e., Simply Saline) or nasal saline lavage (i.e., NeilMed) is recommended as needed and prior to medicated nasal sprays. Start allergy injections - 2 shots. Let us  know when ready to start.  I will need to send in epinephrine  device once starting allergy injections.   Atopic dermatitis Continue proper skin care.  Use Eucrisa (crisaborole) 2% ointment twice a day on mild rash flares on the face and body. This is a non-steroid ointment.  If it burns, place the medication in the refrigerator.  Apply a thin layer of  moisturizer and then apply the Eucrisa on top of it.  Migraines/headaches: Continue to follow up with your neurologist  Follow up in 6 months or sooner if needed.

## 2024-02-25 ENCOUNTER — Other Ambulatory Visit: Payer: Self-pay | Admitting: Neurology

## 2024-02-29 ENCOUNTER — Telehealth: Payer: Self-pay | Admitting: Neurology

## 2024-02-29 ENCOUNTER — Other Ambulatory Visit: Payer: Self-pay | Admitting: Neurology

## 2024-02-29 MED ORDER — TRAMADOL HCL 50 MG PO TABS: 50.0000 mg | ORAL_TABLET | Freq: Four times a day (QID) | ORAL | 2 refills | Status: DC | PRN

## 2024-02-29 NOTE — Telephone Encounter (Signed)
 Pt.s pharmacy has reached out but no reply Pt needs refill of Rx traMADol 

## 2024-03-01 NOTE — Telephone Encounter (Signed)
 Per Dr.Jaffe, Refill sent to [facility] in [facility]

## 2024-03-14 ENCOUNTER — Encounter: Payer: Self-pay | Admitting: Obstetrics and Gynecology

## 2024-03-14 ENCOUNTER — Ambulatory Visit (INDEPENDENT_AMBULATORY_CARE_PROVIDER_SITE_OTHER): Admitting: Obstetrics and Gynecology

## 2024-03-14 VITALS — BP 114/72 | HR 70 | Ht 64.5 in | Wt 154.0 lb

## 2024-03-14 DIAGNOSIS — Z1331 Encounter for screening for depression: Secondary | ICD-10-CM | POA: Diagnosis not present

## 2024-03-14 DIAGNOSIS — N951 Menopausal and female climacteric states: Secondary | ICD-10-CM

## 2024-03-14 DIAGNOSIS — R350 Frequency of micturition: Secondary | ICD-10-CM | POA: Diagnosis not present

## 2024-03-14 DIAGNOSIS — Z01419 Encounter for gynecological examination (general) (routine) without abnormal findings: Secondary | ICD-10-CM

## 2024-03-14 LAB — URINALYSIS, COMPLETE W/RFL CULTURE
Bacteria, UA: NONE SEEN /HPF
Bilirubin Urine: NEGATIVE
Glucose, UA: NEGATIVE
Hgb urine dipstick: NEGATIVE
Hyaline Cast: NONE SEEN /LPF
Ketones, ur: NEGATIVE
Leukocyte Esterase: NEGATIVE
Nitrites, Initial: NEGATIVE
Protein, ur: NEGATIVE
RBC / HPF: NONE SEEN /HPF (ref 0–2)
Specific Gravity, Urine: 1.01 (ref 1.001–1.035)
WBC, UA: NONE SEEN /HPF (ref 0–5)
pH: 7 (ref 5.0–8.0)

## 2024-03-14 LAB — NO CULTURE INDICATED

## 2024-03-14 MED ORDER — NONFORMULARY OR COMPOUNDED ITEM: 3 refills | Status: AC

## 2024-03-14 NOTE — Patient Instructions (Addendum)
 Fezolinetant Tablets What is this medication? FEZOLINETANT (FEZ oh LIN e tant) reduces the number and severity of hot flashes due to menopause. It works by blocking substances in your body that cause hot flashes and night sweats. This medicine may be used for other purposes; ask your health care provider or pharmacist if you have questions. COMMON BRAND NAME(S): VEOZAH What should I tell my care team before I take this medication? They need to know if you have any of these conditions: Kidney disease Liver disease An unusual or allergic reaction to fezolinetant, other medications, foods, dyes, or preservatives Pregnant or trying to get pregnant Breastfeeding How should I use this medication? Take this medication by mouth with water. Take it as directed on the prescription label at the same time every day. Do not cut, crush, or chew this medication. Swallow the tablets whole. You can take it with or without food. If it upsets your stomach, take it with food. Keep taking it unless your care team tells you to stop. Talk to your care team about the use of this medication in children. Special care may be needed. Overdosage: If you think you have taken too much of this medicine contact a poison control center or emergency room at once. NOTE: This medicine is only for you. Do not share this medicine with others. What if I miss a dose? If you miss a dose, take it as soon as you can unless it is more than 12 hours late. If it is more than 12 hours late, skip the missed dose. Take the next dose at the normal time. What may interact with this medication? Other medications may affect the way this medication works. Talk with your care team about all of the medications you take. They may suggest changes to your treatment plan to lower the risk of side effects and to make sure your medications work as intended. This list may not describe all possible interactions. Give your health care provider a list of all  the medicines, herbs, non-prescription drugs, or dietary supplements you use. Also tell them if you smoke, drink alcohol, or use illegal drugs. Some items may interact with your medicine. What should I watch for while using this medication? Visit your care team for regular checks on your progress. Tell your care team if your symptoms do not start to get better or if they get worse. You may need blood work while taking this medication. What side effects may I notice from receiving this medication? Side effects that you should report to your care team as soon as possible: Allergic reactions--skin rash, itching, hives, swelling of the face, lips, tongue, or throat Liver injury--right upper belly pain, loss of appetite, nausea, light-colored stool, dark yellow or brown urine, yellowing skin or eyes, unusual weakness or fatigue Side effects that usually do not require medical attention (report these to your care team if they continue or are bothersome): Back pain Diarrhea Hot flashes Stomach pain Trouble sleeping This list may not describe all possible side effects. Call your doctor for medical advice about side effects. You may report side effects to FDA at 1-800-FDA-1088. Where should I keep my medication? Keep out of the reach of children and pets. Store at room temperature between 20 and 25 degrees C (68 and 77 degrees F). Get rid of any unused medication after the expiration date. To get rid of medications that are no longer needed or have expired: Take the medication to a medication take-back program. Check with  your pharmacy or law enforcement to find a location. If you cannot return the medication, check the label or package insert to see if the medication should be thrown out in the garbage or flushed down the toilet. If you are not sure, ask your care team. If it is safe to put it in the trash, take the medication out of the container. Mix the medication with cat litter, dirt, coffee  grounds, or other unwanted substance. Seal the mixture in a bag or container. Put it in the trash. NOTE: This sheet is a summary. It may not cover all possible information. If you have questions about this medicine, talk to your doctor, pharmacist, or health care provider.  2024 Elsevier/Gold Standard (2022-01-29 00:00:00)  EXERCISE AND DIET:  We recommended that you start or continue a regular exercise program for good health. Regular exercise means any activity that makes your heart beat faster and makes you sweat.  We recommend exercising at least 30 minutes per day at least 3 days a week, preferably 4 or 5.  We also recommend a diet low in fat and sugar.  Inactivity, poor dietary choices and obesity can cause diabetes, heart attack, stroke, and kidney damage, among others.    ALCOHOL AND SMOKING:  Women should limit their alcohol intake to no more than 7 drinks/beers/glasses of wine (combined, not each!) per week. Moderation of alcohol intake to this level decreases your risk of breast cancer and liver damage. And of course, no recreational drugs are part of a healthy lifestyle.  And absolutely no smoking or even second hand smoke. Most people know smoking can cause heart and lung diseases, but did you know it also contributes to weakening of your bones? Aging of your skin?  Yellowing of your teeth and nails?  CALCIUM AND VITAMIN D:  Adequate intake of calcium and Vitamin D are recommended.  The recommendations for exact amounts of these supplements seem to change often, but generally speaking 600 mg of calcium (either carbonate or citrate) and 800 units of Vitamin D per day seems prudent. Certain women may benefit from higher intake of Vitamin D.  If you are among these women, your doctor will have told you during your visit.    PAP SMEARS:  Pap smears, to check for cervical cancer or precancers,  have traditionally been done yearly, although recent scientific advances have shown that most women can  have pap smears less often.  However, every woman still should have a physical exam from her gynecologist every year. It will include a breast check, inspection of the vulva and vagina to check for abnormal growths or skin changes, a visual exam of the cervix, and then an exam to evaluate the size and shape of the uterus and ovaries.  And after 49 years of age, a rectal exam is indicated to check for rectal cancers. We will also provide age appropriate advice regarding health maintenance, like when you should have certain vaccines, screening for sexually transmitted diseases, bone density testing, colonoscopy, mammograms, etc.   MAMMOGRAMS:  All women over 86 years old should have a yearly mammogram. Many facilities now offer a "3D" mammogram, which may cost around $50 extra out of pocket. If possible,  we recommend you accept the option to have the 3D mammogram performed.  It both reduces the number of women who will be called back for extra views which then turn out to be normal, and it is better than the routine mammogram at detecting truly abnormal  areas.    COLONOSCOPY:  Colonoscopy to screen for colon cancer is recommended for all women at age 24.  We know, you hate the idea of the prep.  We agree, BUT, having colon cancer and not knowing it is worse!!  Colon cancer so often starts as a polyp that can be seen and removed at colonscopy, which can quite literally save your life!  And if your first colonoscopy is normal and you have no family history of colon cancer, most women don't have to have it again for 10 years.  Once every ten years, you can do something that may end up saving your life, right?  We will be happy to help you get it scheduled when you are ready.  Be sure to check your insurance coverage so you understand how much it will cost.  It may be covered as a preventative service at no cost, but you should check your particular policy.
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
## 2024-03-15 ENCOUNTER — Ambulatory Visit: Payer: Self-pay | Admitting: Obstetrics and Gynecology

## 2024-05-24 ENCOUNTER — Telehealth: Payer: Self-pay | Admitting: Pharmacy Technician

## 2024-05-24 ENCOUNTER — Other Ambulatory Visit (HOSPITAL_COMMUNITY): Payer: Self-pay

## 2024-05-24 NOTE — Telephone Encounter (Signed)
 Pharmacy Patient Advocate Encounter  Received notification from [facility] that Prior Authorization for AJOVY  225MG  has been APPROVED from 9.17.25 to 9.17.26. Ran test claim, Copay is $24.98. This test claim was processed through [facility]- copay amounts may vary at other pharmacies due to pharmacy/plan contracts, or as the patient moves through the different stages of their insurance plan.   PA #/Case ID/Reference #: [id number]

## 2024-06-13 DIAGNOSIS — E162 Hypoglycemia, unspecified: Secondary | ICD-10-CM | POA: Diagnosis not present

## 2024-06-13 DIAGNOSIS — E88819 Insulin resistance, unspecified: Secondary | ICD-10-CM | POA: Diagnosis not present

## 2024-06-14 ENCOUNTER — Other Ambulatory Visit: Payer: Self-pay | Admitting: Radiology

## 2024-06-14 DIAGNOSIS — Z1231 Encounter for screening mammogram for malignant neoplasm of breast: Secondary | ICD-10-CM

## 2024-06-14 LAB — COMPREHENSIVE METABOLIC PANEL WITH GFR
ALT: 27 IU/L (ref 0–32)
AST: 33 IU/L (ref 0–40)
Albumin: 4.8 g/dL (ref 3.9–4.9)
Alkaline Phosphatase: 80 IU/L (ref 41–116)
BUN/Creatinine Ratio: 12 (ref 9–23)
BUN: 11 mg/dL (ref 6–24)
Bilirubin Total: 0.4 mg/dL (ref 0.0–1.2)
CO2: 25 mmol/L (ref 20–29)
Calcium: 10.2 mg/dL (ref 8.7–10.2)
Chloride: 100 mmol/L (ref 96–106)
Creatinine, Ser: 0.92 mg/dL (ref 0.57–1.00)
Globulin, Total: 2.4 g/dL (ref 1.5–4.5)
Glucose: 78 mg/dL (ref 70–99)
Potassium: 4.4 mmol/L (ref 3.5–5.2)
Sodium: 141 mmol/L (ref 134–144)
Total Protein: 7.2 g/dL (ref 6.0–8.5)
eGFR: 76 mL/min/1.73 (ref >59)

## 2024-06-14 LAB — INSULIN AND C-PEPTIDE, SERUM
C-Peptide: 2.8 ng/mL (ref 1.1–4.4)
INSULIN: 12 u[IU]/mL (ref 2.6–24.9)

## 2024-06-21 ENCOUNTER — Encounter: Payer: Self-pay | Admitting: Nurse Practitioner

## 2024-06-21 ENCOUNTER — Ambulatory Visit: Admitting: Nurse Practitioner

## 2024-06-21 VITALS — BP 112/76 | HR 86 | Ht 64.5 in | Wt 152.0 lb

## 2024-06-21 DIAGNOSIS — E88819 Insulin resistance, unspecified: Secondary | ICD-10-CM

## 2024-06-21 DIAGNOSIS — E162 Hypoglycemia, unspecified: Secondary | ICD-10-CM
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
## 2024-07-17 ENCOUNTER — Ambulatory Visit
Admission: RE | Admit: 2024-07-17 | Discharge: 2024-07-17 | Disposition: A | Source: Ambulatory Visit | Attending: Radiology | Admitting: Radiology

## 2024-07-17 DIAGNOSIS — Z1231 Encounter for screening mammogram for malignant neoplasm of breast: Secondary | ICD-10-CM | POA: Diagnosis not present
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
## 2024-07-31 ENCOUNTER — Ambulatory Visit: Admitting: Neurology

## 2024-07-31 ENCOUNTER — Encounter: Payer: Self-pay | Admitting: Neurology

## 2024-07-31 VITALS — BP 121/72 | HR 70 | Ht 64.0 in | Wt 150.0 lb

## 2024-07-31 DIAGNOSIS — G43109 Migraine with aura, not intractable, without status migrainosus: Secondary | ICD-10-CM

## 2024-07-31 MED ORDER — REYVOW 50 MG PO TABS: 1.0000 | ORAL_TABLET | Freq: Every day | ORAL | 5 refills | Status: AC | PRN

## 2024-07-31 NOTE — Patient Instructions (Signed)
 Will try to start you on Nerivio as rescue and preventative.  Use Cefaly until then Reyvow  once daily as needed

## 2024-08-02 ENCOUNTER — Telehealth: Payer: Self-pay | Admitting: Pharmacy Technician

## 2024-08-02 NOTE — Telephone Encounter (Signed)
 Pharmacy Patient Advocate Encounter   Received notification from CoverMyMeds that prior authorization for REYVOW  100MG  is required/requested.   Insurance verification completed.   The patient is insured through [facility].   Per test claim: PA required; PA submitted to above mentioned insurance via Latent Key/confirmation #/EOC [id number] Status is pending

## 2024-08-04 ENCOUNTER — Other Ambulatory Visit (HOSPITAL_COMMUNITY): Payer: Self-pay

## 2024-08-04 NOTE — Telephone Encounter (Signed)
 Pharmacy Patient Advocate Encounter  Received notification from [facility], [place] that Prior Authorization for REYVOW  100MG  has been APPROVED from 11.28.25 to 11.26.26   PA #/Case ID/Reference #: [id number]

## 2024-08-07 ENCOUNTER — Encounter: Payer: Self-pay | Admitting: Neurology

## 2024-08-12 ENCOUNTER — Other Ambulatory Visit: Payer: Self-pay | Admitting: Neurology

## 2024-08-15 ENCOUNTER — Encounter: Payer: Self-pay | Admitting: Neurology

## 2024-08-23 NOTE — Progress Notes (Unsigned)
 Follow Up Note  RE: ESSENSE BOUSQUET MRN: [id number] DOB: [birth year] Date of Office Visit: 08/24/2024  Referring provider: Domenica Harlene LABOR, MD Primary care provider: Domenica Harlene LABOR, MD  Chief Complaint: No chief complaint on file.  History of Present Illness: I had the pleasure of seeing Tami Mcdonald for a follow up visit at the [facility] on 08/24/2024. She is a 48 y.o. female, who is being followed for asthma, allergic rhinoconjunctivitis, dyshidrotic eczema and migraines. Her previous allergy office visit was on 02/22/2024 with Dr. Luke. Today is a regular follow up visit.  Discussed the use of AI scribe software for clinical note transcription with the patient, who gave verbal consent to proceed.  History of Present Illness            ***  Assessment and Plan: Ellie is a 48 y.o. female with: Asthma Past history - Takes Flovent  110 1 puff twice a day and albuterol  rare occasions with good benefit.  2022 spirometry was normal with no improvement in FEV1 post bronchodilator treatment.  Clinically feeling improved. Interim history - Well controlled with Symbicort  80 mcg twice daily.  Today's spirometry was normal. Daily controller medication(s): Symbicort  80mcg 2 puffs twice a day with spacer and rinse mouth afterwards. May use albuterol  rescue inhaler 2 puffs every 4 to 6 hours as needed for shortness of breath, chest tightness, coughing, and wheezing.  Monitor frequency of use - if you need to use it more than twice per week on a consistent basis let us  know.    Seasonal and perennial allergic rhinoconjunctivitis Past history - Perennial rhinoconjunctivitis symptoms for 40+ years.  2003 skin testing showed multiple positives and was on AIT for 4 to 5 years with good benefit.  Patient is a administrator, civil service.  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 2022 skin testing positive to grass, trees, mold, cat, dog, horse and dust mites. Borderline to mixed feathers, mouse,  hamster, guinea pig, rabbit, gerbil and rat. Singulair  caused mood changes. Interim history - manageable with below regimen. Once migraines controlled interested in starting AIT.  Continue environmental control measures. Use over the counter antihistamines such as Zyrtec (cetirizine), Claritin (loratadine), Allegra  (fexofenadine ), or Xyzal (levocetirizine) daily as needed. May take twice a day during allergy flares. May switch antihistamines every few months. Use Atrovent  (ipratropium) 0.03% 1-2 sprays per nostril twice a day as needed for runny nose/drainage. Use Flonase  (fluticasone ) nasal spray 1-2 sprays per nostril once a day as needed for nasal congestion.  Nasal saline spray (i.e., Simply Saline) or nasal saline lavage (i.e., NeilMed) is recommended as needed and prior to medicated nasal sprays. Start allergy injections - 2 shots. Let us  know when ready to start.  I will need to send in epinephrine  device once starting allergy injections.    Dyshidrotic eczema Improved.  Continue proper skin care.  Use Eucrisa (crisaborole) 2% ointment twice a day on mild rash flares on the face and body. This is a non-steroid ointment.    Migraines/headaches Continue to follow up with your neurologist Assessment and Plan              No follow-ups on file.  No orders of the defined types were placed in this encounter.  Lab Orders  No laboratory test(s) ordered today    Diagnostics: Spirometry:  Tracings reviewed. Her effort: {Blank single:19197::Good reproducible efforts.,It was hard to get consistent efforts and there is a question as to whether this reflects a  maximal maneuver.,Poor effort, data can not be interpreted.} FVC: ***L FEV1: ***L, ***% predicted FEV1/FVC ratio: ***% Interpretation: {Blank single:19197::Spirometry consistent with mild obstructive disease,Spirometry consistent with moderate obstructive disease,Spirometry consistent with severe obstructive  disease,Spirometry consistent with possible restrictive disease,Spirometry consistent with mixed obstructive and restrictive disease,Spirometry uninterpretable due to technique,Spirometry consistent with normal pattern,No overt abnormalities noted given today's efforts}.  Please see scanned spirometry results for details.  Skin Testing: {Blank single:19197::Select foods,Environmental allergy panel,Environmental allergy panel and select foods,Food allergy panel,None,Deferred due to recent antihistamines use}. *** Results discussed with patient/family.   Medication List:  Current Outpatient Medications  Medication Sig Dispense Refill   ACETAMINOPHEN -BUTALBITAL  50-325 MG TABS TAKE 1 TO 2 TABLETS BY MOUTH EVERY 6 HOURS AS NEEDED 10 tablet 5   albuterol  (VENTOLIN  HFA) 108 (90 Base) MCG/ACT inhaler Inhale 2 puffs into the lungs every 4 (four) hours as needed for wheezing or shortness of breath (coughing fits). 18 g 1   budesonide -formoterol  (SYMBICORT ) 80-4.5 MCG/ACT inhaler Inhale 2 puffs into the lungs in the morning and at bedtime. with spacer and rinse mouth afterwards. 3 each 3   cholecalciferol (VITAMIN D3) 25 MCG (1000 UNIT) tablet Take 1,000 Units by mouth daily.     Coenzyme Q10 (COQ10 PO) Take by mouth.     Fremanezumab -vfrm (AJOVY ) 225 MG/1.5ML SOAJ Inject 225 mg into the skin every 28 (twenty-eight) days. (Patient not taking: Reported on 07/31/2024) 1.68 mL 5   ipratropium (ATROVENT ) 0.03 % nasal spray Place 1-2 sprays into both nostrils 2 (two) times daily as needed (nasal drainage). 30 mL 5   Lactobacillus Rhamnosus, GG, (CULTURELLE PO) Take by mouth.     Lasmiditan  Succinate (REYVOW ) 50 MG TABS Take 1 tablet by mouth daily as needed. 8 tablet 5   Loratadine 10 MG CAPS Claritin     Multiple Vitamin (MULTIVITAMIN) tablet Take 1 tablet by mouth daily. 2-3 week     mupirocin  ointment (BACTROBAN ) 2 % Place 1 Application into the nose at bedtime as needed. 22 g 1    NONFORMULARY OR COMPOUNDED ITEM Vaginal vitamin E suppository, 200 international units, place one at bed time, twice a week.  [facility]. 24 each 3   ondansetron  (ZOFRAN -ODT) 8 MG disintegrating tablet Take 1 tablet (8 mg total) by mouth every 8 (eight) hours as needed for nausea or vomiting. 20 tablet 3   traMADol  (ULTRAM ) 50 MG tablet TAKE 1 TABLET BY MOUTH EVERY 6 HOURS AS NEEDED. 20 tablet 2   triamcinolone ointment (KENALOG) 0.1 % 1 Application. (Patient taking differently: 1 Application as needed.)     vitamin C (ASCORBIC ACID) 500 MG tablet Take 500 mg by mouth daily. Patient only takes occasionally     No current facility-administered medications for this visit.   Allergies: Allergies[1] I reviewed her past medical history, social history, family history, and environmental history and no significant changes have been reported from her previous visit.  Review of Systems  Constitutional:  Negative for appetite change, chills, fever and unexpected weight change.  HENT:  Negative for rhinorrhea.   Eyes:  Negative for itching.  Respiratory:  Negative for cough, chest tightness, shortness of breath and wheezing.   Cardiovascular:  Negative for chest pain.  Gastrointestinal:  Negative for abdominal pain.  Genitourinary:  Negative for difficulty urinating.  Skin:  Negative for rash.  Allergic/Immunologic: Positive for environmental allergies.  Neurological:  Positive for headaches.    Objective: There were no vitals taken for this visit. There is no height or  weight on file to calculate BMI. Physical Exam Vitals and nursing note reviewed.  Constitutional:      Appearance: Normal appearance. She is well-developed.  HENT:     Head: Normocephalic and atraumatic.     Right Ear: Tympanic membrane and external ear normal.     Left Ear: Tympanic membrane and external ear normal.     Nose: Nose normal.     Mouth/Throat:     Mouth: Mucous membranes are moist.     Pharynx:  Oropharynx is clear.  Eyes:     Conjunctiva/sclera: Conjunctivae normal.  Cardiovascular:     Rate and Rhythm: Normal rate and regular rhythm.     Heart sounds: Normal heart sounds. No murmur heard.    No friction rub. No gallop.  Pulmonary:     Effort: Pulmonary effort is normal.     Breath sounds: Normal breath sounds. No wheezing, rhonchi or rales.  Musculoskeletal:     Cervical back: Neck supple.  Skin:    General: Skin is warm.     Findings: No rash.  Neurological:     Mental Status: She is alert and oriented to person, place, and time.  Psychiatric:        Behavior: Behavior normal.    Previous notes and tests were reviewed. The plan was reviewed with the patient/family, and all questions/concerned were addressed.  It was my pleasure to see Tami Mcdonald today and participate in her care. Please feel free to contact me with any questions or concerns.  Sincerely,  Orlan Cramp, DO Allergy & Immunology  [facility] office: [phone] [facility] office: [phone]    [1]  Allergies Allergen Reactions   Gluten Meal Anaphylaxis    Gluten causes asthma attack and severe allergy symptoms   Hydroxyzine Hcl Other (See Comments)    ?reaction type  ?reaction type, Other Reaction: Other reaction   Kiwi Extract Itching   Nortriptyline  Hypertension    Palpitations, high blood pressure   Other Other (See Comments), Nausea And Vomiting and Rash    Uncoded Allergy. Allergen: tape, tegaderm   Aimovig  [Erenumab -Aooe] Rash   Dairy Aid [Tilactase] Cough    DAIRY-Allergy symptoms   Nurtec [Rimegepant Sulfate] Rash   Penicillins Rash   Singulair  [Montelukast ] Other (See Comments)    Mood changes   Sulfa Antibiotics Nausea And Vomiting and Rash

## 2024-08-24 ENCOUNTER — Ambulatory Visit: Admitting: Allergy

## 2024-08-24 ENCOUNTER — Encounter: Payer: Self-pay | Admitting: Allergy

## 2024-08-24 VITALS — BP 102/70 | HR 85 | Temp 98.3°F | Resp 16 | Wt 149.5 lb

## 2024-08-24 DIAGNOSIS — J3089 Other allergic rhinitis: Secondary | ICD-10-CM | POA: Diagnosis not present

## 2024-08-24 DIAGNOSIS — H1010 Acute atopic conjunctivitis, unspecified eye: Secondary | ICD-10-CM

## 2024-08-24 DIAGNOSIS — J4540 Moderate persistent asthma, uncomplicated: Secondary | ICD-10-CM

## 2024-08-24 DIAGNOSIS — L301 Dyshidrosis [pompholyx]: Secondary | ICD-10-CM | POA: Diagnosis not present

## 2024-08-24 DIAGNOSIS — J302 Other seasonal allergic rhinitis: Secondary | ICD-10-CM

## 2024-08-24 DIAGNOSIS — G43009 Migraine without aura, not intractable, without status migrainosus: Secondary | ICD-10-CM | POA: Diagnosis not present

## 2024-08-24 MED ORDER — AIRSUPRA 90-80 MCG/ACT IN AERO: 2.0000 | INHALATION_SPRAY | RESPIRATORY_TRACT | 2 refills | Status: AC | PRN

## 2024-08-24 MED ORDER — BUDESONIDE-FORMOTEROL FUMARATE 160-4.5 MCG/ACT IN AERO: 2.0000 | INHALATION_SPRAY | Freq: Two times a day (BID) | RESPIRATORY_TRACT | 5 refills | Status: AC

## 2024-08-24 MED ORDER — EPINEPHRINE 0.3 MG/0.3ML IJ SOAJ: 0.3000 mg | INTRAMUSCULAR | 1 refills | Status: AC | PRN

## 2024-08-24 NOTE — Patient Instructions (Addendum)
 Asthma: Normal breathing test today. Daily controller medication(s): START Symbicort  160mcg 2 puffs twice a day with spacer and rinse mouth afterwards. May use Airsupra  rescue inhaler 2 puffs every 4 to 6 hours as needed for shortness of breath, chest tightness, coughing, and wheezing. Do not use more than 12 puffs in 24 hours. May use Airsupra  rescue inhaler 2 puffs 5 to 15 minutes prior to strenuous physical activities. Rinse mouth after each use.  Coupon given. Sample given. Monitor frequency of use - if you need to use it more than twice per week on a consistent basis let us  know.  Asthma control goals:  Full participation in all desired activities (may need albuterol  before activity) Albuterol  use two times or less a week on average (not counting use with activity) Cough interfering with sleep two times or less a month Oral steroids no more than once a year No hospitalizations   Environmental allergies 2022 skin testing positive to grass, trees, mold, cat, dog, horse and dust mites. Borderline to mixed feathers, mouse, hamster, guinea pig, rabbit, gerbil and rat. Continue environmental control measures. Use over the counter antihistamines such as Zyrtec (cetirizine), Claritin (loratadine), Allegra  (fexofenadine ), or Xyzal (levocetirizine) daily as needed. May take twice a day during allergy flares. May switch antihistamines every few months. Start Ryaltris  (olopatadine + mometasone nasal spray combination) 1-2 sprays per nostril twice a day. Sample given. This replaces your other nasal sprays. If this works well for you, then have pharmacy ship the medication to your home - prescription already sent in.  Nasal saline spray (i.e., Simply Saline) or nasal saline lavage (i.e., NeilMed) is recommended as needed and prior to medicated nasal sprays.  Recommend allergy injections. 2 shots. Let us  know when ready to start.  Had a detailed discussion with patient/family that clinical history is  suggestive of allergic rhinitis, and may benefit from allergy immunotherapy (AIT). Discussed in detail regarding the dosing, schedule, side effects (mild to moderate local allergic reaction and rarely systemic allergic reactions including anaphylaxis), and benefits (significant improvement in nasal symptoms, seasonal flares of asthma) of immunotherapy with the patient. There is significant time commitment involved with allergy shots, which includes weekly immunotherapy injections for first 9-12 months and then biweekly to monthly injections for 3-5 years. Consent was signed.  I have prescribed epinephrine  injectable device and demonstrated proper use. For mild symptoms you can take over the counter antihistamines (zyrtec 10mg  to 20mg ) and monitor symptoms closely.  If symptoms worsen or if you have severe symptoms including breathing issues, throat closure, significant swelling, whole body hives, severe diarrhea and vomiting, lightheadedness then use epinephrine  and seek immediate medical care afterwards. Emergency action plan given.  Atopic dermatitis Continue proper skin care.  Use Eucrisa (crisaborole) 2% ointment twice a day on mild rash flares on the face and body. This is a non-steroid ointment.  If it burns, place the medication in the refrigerator.  Apply a thin layer of moisturizer and then apply the Eucrisa on top of it.  Migraines/headaches: Continue to follow up with your neurologist  Follow up in 6 months or sooner if needed.

## 2024-08-29 ENCOUNTER — Telehealth: Payer: Self-pay

## 2024-08-29 NOTE — Telephone Encounter (Signed)
 Received  cover my meds communication neffy  2mg  /56ml solutions key [id number]  needs PA

## 2024-08-29 NOTE — Telephone Encounter (Signed)
 According to CovermyMeds: Prior Authorization Not Required

## 2024-09-01 ENCOUNTER — Ambulatory Visit: Admitting: Gastroenterology

## 2024-09-01 ENCOUNTER — Encounter: Payer: Self-pay | Admitting: Gastroenterology

## 2024-09-01 VITALS — BP 120/72 | HR 78 | Ht 64.0 in | Wt 150.0 lb

## 2024-09-01 DIAGNOSIS — K222 Esophageal obstruction: Secondary | ICD-10-CM

## 2024-09-01 DIAGNOSIS — K224 Dyskinesia of esophagus: Secondary | ICD-10-CM

## 2024-09-01 DIAGNOSIS — Z9889 Other specified postprocedural states: Secondary | ICD-10-CM

## 2024-09-01 DIAGNOSIS — R1319 Other dysphagia: Secondary | ICD-10-CM
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
## 2024-09-01 NOTE — Patient Instructions (Addendum)
 VISIT SUMMARY:  You had a follow-up visit to discuss your swallowing difficulties and hemorrhoidal symptoms. Your swallowing has improved since your recent esophageal dilation, but you still have mild difficulty with solid foods. Your hemorrhoidal symptoms are well-managed with your current bowel regimen.  YOUR PLAN:  ESOPHAGEAL STRICTURE: You have a mild residual difficulty swallowing solid foods after your recent esophageal dilation. -A barium esophagram has been ordered to assess your esophageal transit and check for any persistent or recurrent narrowing. -If the barium esophagram shows impaired flow or stasis, we may consider esophageal manometry. -Further dilation or surgical intervention will only be considered if a significant abnormality is found. -You should have an annual follow-up if your condition remains stable, but come in earlier if your symptoms worsen or if the barium study shows any abnormalities.  HEMORRHOIDS: Your hemorrhoidal symptoms are currently well-managed without recent issues. -Continue your current bowel regimen with fiber and stool softeners as needed. -Long-term use of fiber and stool softeners is safe and appropriate.    You have been scheduled for a Barium Esophogram at [facility] (1st floor of the hospital) on 09/19/24 at 11:00am. Please arrive 30 minutes prior to your appointment for registration. Make certain not to have anything to eat or drink 3 hours prior to your test. If you need to reschedule for any reason, please contact radiology at [phone] to do so. _____________________________________________________________ A barium swallow is an examination that concentrates on views of the esophagus. This tends to be a double contrast exam (barium and two liquids which, when combined, create a gas to distend the wall of the oesophagus) or single contrast (non-ionic iodine based). The study is usually tailored to your symptoms so a good history is  essential. Attention is paid during the study to the form, structure and configuration of the esophagus, looking for functional disorders (such as aspiration, dysphagia, achalasia, motility and reflux) EXAMINATION You may be asked to change into a gown, depending on the type of swallow being performed. A radiologist and radiographer will perform the procedure. The radiologist will advise you of the type of contrast selected for your procedure and direct you during the exam. You will be asked to stand, sit or lie in several different positions and to hold a small amount of fluid in your mouth before being asked to swallow while the imaging is performed .In some instances you may be asked to swallow barium coated marshmallows to assess the motility of a solid food bolus. The exam can be recorded as a digital or video fluoroscopy procedure. POST PROCEDURE It will take 1-2 days for the barium to pass through your system. To facilitate this, it is important, unless otherwise directed, to increase your fluids for the next 24-48hrs and to resume your normal diet.  This test typically takes about 30 minutes to perform. _____________________________________________________________

## 2024-09-05 ENCOUNTER — Telehealth: Payer: Self-pay

## 2024-09-05 NOTE — Telephone Encounter (Signed)
 Received communication from [facility] [phone] fax [phone].CMM KEY [id number]

## 2024-09-06 ENCOUNTER — Encounter: Payer: Self-pay | Admitting: Gastroenterology

## 2024-09-10 NOTE — Assessment & Plan Note (Signed)
 Encourage heart healthy diet such as MIND or DASH diet, increase exercise, avoid trans fats, simple carbohydrates and processed foods, consider a krill or fish or flaxseed oil cap daily.
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
## 2024-09-10 NOTE — Assessment & Plan Note (Signed)
 Supplement and monitor

## 2024-09-10 NOTE — Assessment & Plan Note (Addendum)
 Patient encouraged to maintain heart healthy diet, regular exercise, adequate sleep. Consider daily probiotics. Take medications as prescribed. Labs ordered and reviewed. Follows with GYN, pap in April 2023 repeat in 3-5 years, Dr Lyle Cary [facility] 07/2024 repeat every 1-2 years Colonoscopy 2024 with polyps repeat in 2027 Shingrix is the new shingles shot, 2 shots over 2-6 months, confirm coverage with insurance and document, then can return here for shots with nurse appt or at pharmacy

## 2024-09-10 NOTE — Assessment & Plan Note (Signed)
 Hydrate and monitor

## 2024-09-10 NOTE — Assessment & Plan Note (Signed)
 Encouraged good sleep hygiene such as dark, quiet room. No blue/green glowing lights such as computer screens in bedroom. No alcohol or stimulants in evening. Cut down on caffeine as able. Regular exercise is helpful but not just prior to bed time.

## 2024-09-10 NOTE — Assessment & Plan Note (Signed)
 Works as a international aid/development worker

## 2024-09-14 ENCOUNTER — Ambulatory Visit: Payer: BC Managed Care – PPO | Admitting: Family Medicine

## 2024-09-14 ENCOUNTER — Encounter: Payer: Self-pay | Admitting: Family Medicine

## 2024-09-14 ENCOUNTER — Ambulatory Visit: Payer: Self-pay | Admitting: Family Medicine

## 2024-09-14 VITALS — BP 122/76 | HR 70 | Temp 98.0°F | Resp 16 | Ht 64.0 in | Wt 152.2 lb

## 2024-09-14 DIAGNOSIS — K229 Disease of esophagus, unspecified: Secondary | ICD-10-CM

## 2024-09-14 DIAGNOSIS — Z203 Contact with and (suspected) exposure to rabies: Secondary | ICD-10-CM

## 2024-09-14 DIAGNOSIS — E559 Vitamin D deficiency, unspecified: Secondary | ICD-10-CM

## 2024-09-14 DIAGNOSIS — Z9889 Other specified postprocedural states: Secondary | ICD-10-CM | POA: Insufficient documentation

## 2024-09-14 DIAGNOSIS — R252 Cramp and spasm: Secondary | ICD-10-CM

## 2024-09-14 DIAGNOSIS — Z0000 Encounter for general adult medical examination without abnormal findings: Secondary | ICD-10-CM

## 2024-09-14 DIAGNOSIS — E162 Hypoglycemia, unspecified: Secondary | ICD-10-CM | POA: Diagnosis not present

## 2024-09-14 DIAGNOSIS — G4700 Insomnia, unspecified: Secondary | ICD-10-CM

## 2024-09-14 DIAGNOSIS — E785 Hyperlipidemia, unspecified: Secondary | ICD-10-CM | POA: Diagnosis not present

## 2024-09-14 LAB — COMPREHENSIVE METABOLIC PANEL WITH GFR
ALT: 29 U/L (ref 3–35)
AST: 26 U/L (ref 5–37)
Albumin: 4.4 g/dL (ref 3.5–5.2)
Alkaline Phosphatase: 61 U/L (ref 39–117)
BUN: 9 mg/dL (ref 6–23)
CO2: 31 meq/L (ref 19–32)
Calcium: 9.4 mg/dL (ref 8.4–10.5)
Chloride: 104 meq/L (ref 96–112)
Creatinine, Ser: 0.83 mg/dL (ref 0.40–1.20)
GFR: 82.45 mL/min
Glucose, Bld: 93 mg/dL (ref 70–99)
Potassium: 4.1 meq/L (ref 3.5–5.1)
Sodium: 140 meq/L (ref 135–145)
Total Bilirubin: 0.5 mg/dL (ref 0.2–1.2)
Total Protein: 6.8 g/dL (ref 6.0–8.3)

## 2024-09-14 LAB — LIPID PANEL
Cholesterol: 256 mg/dL — ABNORMAL HIGH (ref 28–200)
HDL: 111.8 mg/dL
LDL Cholesterol: 128 mg/dL — ABNORMAL HIGH (ref 10–99)
NonHDL: 144.37
Total CHOL/HDL Ratio: 2
Triglycerides: 83 mg/dL (ref 10.0–149.0)
VLDL: 16.6 mg/dL (ref 0.0–40.0)

## 2024-09-14 LAB — CBC WITH DIFFERENTIAL/PLATELET
Basophils Absolute: 0.1 K/uL (ref 0.0–0.1)
Basophils Relative: 1.2 % (ref 0.0–3.0)
Eosinophils Absolute: 0.1 K/uL (ref 0.0–0.7)
Eosinophils Relative: 1.7 % (ref 0.0–5.0)
HCT: 39.5 % (ref 36.0–46.0)
Hemoglobin: 12.9 g/dL (ref 12.0–15.0)
Lymphocytes Relative: 27.1 % (ref 12.0–46.0)
Lymphs Abs: 1.2 K/uL (ref 0.7–4.0)
MCHC: 32.7 g/dL (ref 30.0–36.0)
MCV: 87.4 fl (ref 78.0–100.0)
Monocytes Absolute: 0.6 K/uL (ref 0.1–1.0)
Monocytes Relative: 13.5 % — ABNORMAL HIGH (ref 3.0–12.0)
Neutro Abs: 2.5 K/uL (ref 1.4–7.7)
Neutrophils Relative %: 56.5 % (ref 43.0–77.0)
Platelets: 328 K/uL (ref 150.0–400.0)
RBC: 4.52 Mil/uL (ref 3.87–5.11)
RDW: 14.9 % (ref 11.5–15.5)
WBC: 4.5 K/uL (ref 4.0–10.5)

## 2024-09-14 LAB — VITAMIN D 25 HYDROXY (VIT D DEFICIENCY, FRACTURES): VITD: 37.89 ng/mL (ref 30.00–100.00)

## 2024-09-14 LAB — HEMOGLOBIN A1C: Hgb A1c MFr Bld: 5.2 % (ref 4.6–6.5)

## 2024-09-14 MED ORDER — AZITHROMYCIN 250 MG PO TABS: ORAL_TABLET | ORAL | 0 refills | Status: AC

## 2024-09-14 NOTE — Assessment & Plan Note (Signed)
 Has a barium Swallow ordered soon

## 2024-09-14 NOTE — Patient Instructions (Addendum)
 CBTinsomnia APP by [facility]  Magnesium  Glycinate 400 mg at bed L Tryptophan [place] can order online  Shingrix is the new shingles shot, 2 shots over 2-6 months, confirm coverage with insurance and document, then can return here for shots with nurse appt or at pharmacy    Preventive Care 42-50 Years Old, Female Preventive care refers to lifestyle choices and visits with your health care provider that can promote health and wellness. Preventive care visits are also called wellness exams. What can I expect for my preventive care visit? Counseling Your health care provider may ask you questions about your: Medical history, including: Past medical problems. Family medical history. Pregnancy history. Current health, including: Menstrual cycle. Method of birth control. Emotional well-being. Home life and relationship well-being. Sexual activity and sexual health. Lifestyle, including: Alcohol, nicotine or tobacco, and drug use. Access to firearms. Diet, exercise, and sleep habits. Work and work astronomer. Sunscreen use. Safety issues such as seatbelt and bike helmet use. Physical exam Your health care provider will check your: Height and weight. These may be used to calculate your BMI (body mass index). BMI is a measurement that tells if you are at a healthy weight. Waist circumference. This measures the distance around your waistline. This measurement also tells if you are at a healthy weight and may help predict your risk of certain diseases, such as type 2 diabetes and high blood pressure. Heart rate and blood pressure. Body temperature. Skin for abnormal spots. What immunizations do I need?  Vaccines are usually given at various ages, according to a schedule. Your health care provider will recommend vaccines for you based on your age, medical history, and lifestyle or other factors, such as travel or where you work. What tests do I need? Screening Your health care provider may  recommend screening tests for certain conditions. This may include: Lipid and cholesterol levels. Diabetes screening. This is done by checking your blood sugar (glucose) after you have not eaten for a while (fasting). Pelvic exam and Pap test. Hepatitis B test. Hepatitis C test. HIV (human immunodeficiency virus) test. STI (sexually transmitted infection) testing, if you are at risk. Lung cancer screening. Colorectal cancer screening. Mammogram. Talk with your health care provider about when you should start having regular mammograms. This may depend on whether you have a family history of breast cancer. BRCA-related cancer screening. This may be done if you have a family history of breast, ovarian, tubal, or peritoneal cancers. Bone density scan. This is done to screen for osteoporosis. Talk with your health care provider about your test results, treatment options, and if necessary, the need for more tests. Follow these instructions at home: Eating and drinking  Eat a diet that includes fresh fruits and vegetables, whole grains, lean protein, and low-fat dairy products. Take vitamin and mineral supplements as recommended by your health care provider. Do not drink alcohol if: Your health care provider tells you not to drink. You are pregnant, may be pregnant, or are planning to become pregnant. If you drink alcohol: Limit how much you have to 0-1 drink a day. Know how much alcohol is in your drink. In the [place] one drink equals one 12 oz bottle of beer (355 mL), one 5 oz glass of wine (148 mL), or one 1 oz glass of hard liquor (44 mL). Lifestyle Brush your teeth every morning and night with fluoride toothpaste. Floss one time each day. Exercise for at least 30 minutes 5 or more days each week. Do not use  any products that contain nicotine or tobacco. These products include cigarettes, chewing tobacco, and vaping devices, such as e-cigarettes. If you need help quitting, ask your health  care provider. Do not use drugs. If you are sexually active, practice safe sex. Use a condom or other form of protection to prevent STIs. If you do not wish to become pregnant, use a form of birth control. If you plan to become pregnant, see your health care provider for a prepregnancy visit. Take aspirin only as told by your health care provider. Make sure that you understand how much to take and what form to take. Work with your health care provider to find out whether it is safe and beneficial for you to take aspirin daily. Find healthy ways to manage stress, such as: Meditation, yoga, or listening to music. Journaling. Talking to a trusted person. Spending time with friends and family. Minimize exposure to UV radiation to reduce your risk of skin cancer. Safety Always wear your seat belt while driving or riding in a vehicle. Do not drive: If you have been drinking alcohol. Do not ride with someone who has been drinking. When you are tired or distracted. While texting. If you have been using any mind-altering substances or drugs. Wear a helmet and other protective equipment during sports activities. If you have firearms in your house, make sure you follow all gun safety procedures. Seek help if you have been physically or sexually abused. What's next? Visit your health care provider once a year for an annual wellness visit. Ask your health care provider how often you should have your eyes and teeth checked. Stay up to date on all vaccines. This information is not intended to replace advice given to you by your health care provider. Make sure you discuss any questions you have with your health care provider. Document Revised: 02/19/2021 Document Reviewed: 02/19/2021 Elsevier Patient Education  2024 [employer].

## 2024-09-17 ENCOUNTER — Encounter: Payer: Self-pay | Admitting: Family Medicine

## 2024-09-18 LAB — EXTRA SPECIMEN

## 2024-09-18 LAB — RABIES VIRUS ANTIBODY TITER

## 2024-09-19 ENCOUNTER — Other Ambulatory Visit (HOSPITAL_COMMUNITY)

## 2024-09-28 NOTE — Progress Notes (Signed)
 Aeroallergen Immunotherapy  Ordering Provider: Orlan Cramp, DO  Patient Details Name: Tami Mcdonald MRN: [id number] Date of Birth: 06/26/1976  Order 1 of 2  Vial Label: G-T-C  0.3 ml (Volume)  BAU Concentration -- 7 Grass Mix* 100,000 ([place]  Blue, [place], [place], Perennial Rye, RedTop, Sweet Vernal, Timothy) 0.2 ml (Volume)  1:20 Concentration -- [place] 0.5 ml (Volume)  1:20 Concentration -- Eastern 10 Tree Mix* 0.2 ml (Volume)  1:20 Concentration -- Box Elder 0.2 ml (Volume)  1:10 Concentration -- Pecan Pollen 0.2 ml (Volume)  1:20 Concentration -- Walnut, Black Pollen 0.5 ml (Volume)  1:10 Concentration -- Cat Hair   2.1  ml Extract Subtotal 2.9  ml Diluent  5.0  ml Maintenance Total  Schedule:  B Silver Vial (1:10,000): Schedule B (6 doses) Green Vial (1:1,000): Schedule B (6 doses) Blue Vial (1:100): Schedule B (6 doses) Yellow Vial (1:10): Schedule B (6 doses) Red Vial (1:1): Schedule A (14 doses)  Special Instructions: May come in 1-2 times a week during build up as tolerated. Once a week on red vial. Once on red vial #1 0.5cc go every 2 weeks, on red vial #2 0.5cc go every 4 weeks. May build up red vials faster (0.1, 0.3, 0.5).

## 2024-09-28 NOTE — Progress Notes (Signed)
 WILL MAKE WHEN SCHEDULED.

## 2024-09-28 NOTE — Progress Notes (Signed)
 Aeroallergen Immunotherapy  Ordering Provider: Orlan Cramp, DO  Patient Details Name: Tami Mcdonald MRN: [id number] Date of Birth: [birth year]  Order 2 of 2  Vial Label: M-D-Dm-H  0.2 ml (Volume)  1:10 Concentration -- Aspergillus mix 0.2 ml (Volume)  1:10 Concentration -- Penicillium mix 0.5 ml (Volume)   AU Concentration -- Mite Mix (DF 5,000 & DP 5,000) 0.5 ml (Volume)  1:10 Concentration -- Dog Epithelia 0.3 ml (Volume)  1:10 Concentration -- Horse Epithelia   1.7  ml Extract Subtotal 3.3  ml Diluent  5.0  ml Maintenance Total  Schedule:  B Silver Vial (1:10,000): Schedule B (6 doses) Green Vial (1:1,000): Schedule B (6 doses) Blue Vial (1:100): Schedule B (6 doses) Yellow Vial (1:10): Schedule B (6 doses) Red Vial (1:1): Schedule A (14 doses)  Special Instructions: May come in 1-2 times a week during build up as tolerated. Once a week on red vial. Once on red vial #1 0.5cc go every 2 weeks, on red vial #2 0.5cc go every 4 weeks. May build up red vials faster (0.1, 0.3, 0.5).

## 2024-10-13 ENCOUNTER — Ambulatory Visit: Payer: Self-pay | Admitting: Gastroenterology

## 2024-10-13 ENCOUNTER — Encounter: Payer: Self-pay | Admitting: Family Medicine

## 2024-10-13 ENCOUNTER — Ambulatory Visit (HOSPITAL_COMMUNITY): Admission: RE | Admit: 2024-10-13 | Source: Ambulatory Visit

## 2024-10-13 DIAGNOSIS — Z9889 Other specified postprocedural states: Secondary | ICD-10-CM

## 2024-10-13 DIAGNOSIS — R1319 Other dysphagia: Secondary | ICD-10-CM

## 2024-10-13 DIAGNOSIS — K224 Dyskinesia of esophagus: Secondary | ICD-10-CM

## 2024-10-16 ENCOUNTER — Other Ambulatory Visit

## 2025-02-20 ENCOUNTER — Ambulatory Visit: Admitting: Allergy

## 2025-03-05 ENCOUNTER — Ambulatory Visit: Admitting: Neurology

## 2025-03-20 ENCOUNTER — Ambulatory Visit: Admitting: Obstetrics and Gynecology

## 2025-09-20 ENCOUNTER — Encounter: Admitting: Family Medicine
